# Patient Record
Sex: Female | Born: 1945 | Race: Black or African American | Hispanic: No | Smoking: Former smoker
Health system: Southern US, Community
[De-identification: ages and names within clinical notes are randomized; demographics above are authoritative.]

## PROBLEM LIST (undated history)

## (undated) DIAGNOSIS — R0600 Dyspnea, unspecified: Secondary | ICD-10-CM

## (undated) DIAGNOSIS — F419 Anxiety disorder, unspecified: Secondary | ICD-10-CM

## (undated) DIAGNOSIS — I10 Essential (primary) hypertension: Secondary | ICD-10-CM

## (undated) DIAGNOSIS — E119 Type 2 diabetes mellitus without complications: Secondary | ICD-10-CM

## (undated) DIAGNOSIS — K219 Gastro-esophageal reflux disease without esophagitis: Secondary | ICD-10-CM

## (undated) DIAGNOSIS — M1990 Unspecified osteoarthritis, unspecified site: Secondary | ICD-10-CM

## (undated) HISTORY — PX: CHOLECYSTECTOMY: SHX55

## (undated) HISTORY — PX: ABDOMINAL HYSTERECTOMY: SHX81

---

## 1998-03-05 ENCOUNTER — Ambulatory Visit (HOSPITAL_COMMUNITY): Admission: RE | Admit: 1998-03-05 | Discharge: 1998-03-05 | Payer: Self-pay | Admitting: Family Medicine

## 1998-11-11 ENCOUNTER — Encounter: Payer: Self-pay | Admitting: Family Medicine

## 1998-11-11 ENCOUNTER — Ambulatory Visit (HOSPITAL_COMMUNITY): Admission: RE | Admit: 1998-11-11 | Discharge: 1998-11-11 | Payer: Self-pay | Admitting: Family Medicine

## 1998-11-19 ENCOUNTER — Encounter: Payer: Self-pay | Admitting: General Surgery

## 1998-11-21 ENCOUNTER — Inpatient Hospital Stay (HOSPITAL_COMMUNITY): Admission: RE | Admit: 1998-11-21 | Discharge: 1998-11-24 | Payer: Self-pay | Admitting: General Surgery

## 1999-06-25 ENCOUNTER — Encounter: Payer: Self-pay | Admitting: Family Medicine

## 1999-06-25 ENCOUNTER — Ambulatory Visit (HOSPITAL_COMMUNITY): Admission: RE | Admit: 1999-06-25 | Discharge: 1999-06-25 | Payer: Self-pay | Admitting: Family Medicine

## 2000-05-30 ENCOUNTER — Ambulatory Visit (HOSPITAL_COMMUNITY): Admission: RE | Admit: 2000-05-30 | Discharge: 2000-05-30 | Payer: Self-pay | Admitting: Family Medicine

## 2000-06-09 ENCOUNTER — Encounter: Admission: RE | Admit: 2000-06-09 | Discharge: 2000-06-09 | Payer: Self-pay | Admitting: Oncology

## 2000-06-09 ENCOUNTER — Encounter: Payer: Self-pay | Admitting: Oncology

## 2000-06-29 ENCOUNTER — Encounter: Payer: Self-pay | Admitting: General Surgery

## 2000-06-29 ENCOUNTER — Encounter: Admission: RE | Admit: 2000-06-29 | Discharge: 2000-06-29 | Payer: Self-pay | Admitting: General Surgery

## 2001-02-16 ENCOUNTER — Ambulatory Visit (HOSPITAL_COMMUNITY): Admission: RE | Admit: 2001-02-16 | Discharge: 2001-02-16 | Payer: Self-pay | Admitting: Family Medicine

## 2001-02-16 ENCOUNTER — Encounter: Payer: Self-pay | Admitting: Family Medicine

## 2001-04-17 ENCOUNTER — Encounter: Payer: Self-pay | Admitting: Family Medicine

## 2001-04-17 ENCOUNTER — Ambulatory Visit (HOSPITAL_COMMUNITY): Admission: RE | Admit: 2001-04-17 | Discharge: 2001-04-17 | Payer: Self-pay | Admitting: Family Medicine

## 2001-09-11 ENCOUNTER — Ambulatory Visit (HOSPITAL_COMMUNITY): Admission: RE | Admit: 2001-09-11 | Discharge: 2001-09-11 | Payer: Self-pay | Admitting: *Deleted

## 2001-09-11 ENCOUNTER — Encounter: Payer: Self-pay | Admitting: *Deleted

## 2002-03-24 ENCOUNTER — Emergency Department (HOSPITAL_COMMUNITY): Admission: EM | Admit: 2002-03-24 | Discharge: 2002-03-24 | Payer: Self-pay | Admitting: Emergency Medicine

## 2002-03-31 ENCOUNTER — Inpatient Hospital Stay (HOSPITAL_COMMUNITY): Admission: EM | Admit: 2002-03-31 | Discharge: 2002-04-07 | Payer: Self-pay | Admitting: Emergency Medicine

## 2002-05-30 ENCOUNTER — Encounter (HOSPITAL_BASED_OUTPATIENT_CLINIC_OR_DEPARTMENT_OTHER): Admission: RE | Admit: 2002-05-30 | Discharge: 2002-08-28 | Payer: Self-pay | Admitting: Dermatology

## 2002-06-01 ENCOUNTER — Inpatient Hospital Stay (HOSPITAL_COMMUNITY): Admission: EM | Admit: 2002-06-01 | Discharge: 2002-06-05 | Payer: Self-pay | Admitting: Emergency Medicine

## 2002-06-01 ENCOUNTER — Encounter: Payer: Self-pay | Admitting: Emergency Medicine

## 2002-06-04 ENCOUNTER — Encounter (INDEPENDENT_AMBULATORY_CARE_PROVIDER_SITE_OTHER): Payer: Self-pay | Admitting: Cardiology

## 2002-08-16 ENCOUNTER — Emergency Department (HOSPITAL_COMMUNITY): Admission: EM | Admit: 2002-08-16 | Discharge: 2002-08-16 | Payer: Self-pay | Admitting: Emergency Medicine

## 2003-08-17 ENCOUNTER — Emergency Department (HOSPITAL_COMMUNITY): Admission: EM | Admit: 2003-08-17 | Discharge: 2003-08-17 | Payer: Self-pay

## 2003-08-19 ENCOUNTER — Ambulatory Visit (HOSPITAL_COMMUNITY): Admission: RE | Admit: 2003-08-19 | Discharge: 2003-08-19 | Payer: Self-pay | Admitting: Family Medicine

## 2003-10-07 ENCOUNTER — Encounter: Admission: RE | Admit: 2003-10-07 | Discharge: 2003-10-07 | Payer: Self-pay | Admitting: Family Medicine

## 2004-03-25 ENCOUNTER — Ambulatory Visit: Payer: Self-pay | Admitting: Family Medicine

## 2004-04-20 ENCOUNTER — Ambulatory Visit: Payer: Self-pay | Admitting: *Deleted

## 2004-06-07 ENCOUNTER — Ambulatory Visit: Payer: Self-pay | Admitting: Internal Medicine

## 2004-07-16 ENCOUNTER — Ambulatory Visit: Payer: Self-pay | Admitting: Family Medicine

## 2004-08-02 ENCOUNTER — Ambulatory Visit: Payer: Self-pay | Admitting: Family Medicine

## 2004-08-10 ENCOUNTER — Ambulatory Visit (HOSPITAL_COMMUNITY): Admission: RE | Admit: 2004-08-10 | Discharge: 2004-08-10 | Payer: Self-pay | Admitting: Family Medicine

## 2004-08-19 ENCOUNTER — Encounter: Admission: RE | Admit: 2004-08-19 | Discharge: 2004-08-19 | Payer: Self-pay | Admitting: Family Medicine

## 2004-08-30 ENCOUNTER — Ambulatory Visit: Payer: Self-pay | Admitting: Family Medicine

## 2004-10-28 ENCOUNTER — Ambulatory Visit: Payer: Self-pay | Admitting: Family Medicine

## 2005-02-07 ENCOUNTER — Ambulatory Visit: Payer: Self-pay | Admitting: Family Medicine

## 2005-02-13 ENCOUNTER — Emergency Department (HOSPITAL_COMMUNITY): Admission: EM | Admit: 2005-02-13 | Discharge: 2005-02-13 | Payer: Self-pay | Admitting: Emergency Medicine

## 2005-02-17 ENCOUNTER — Ambulatory Visit: Payer: Self-pay | Admitting: Family Medicine

## 2005-03-29 ENCOUNTER — Ambulatory Visit: Payer: Self-pay | Admitting: Family Medicine

## 2005-03-31 ENCOUNTER — Ambulatory Visit: Payer: Self-pay | Admitting: Family Medicine

## 2005-05-04 ENCOUNTER — Ambulatory Visit: Payer: Self-pay | Admitting: Family Medicine

## 2005-08-08 ENCOUNTER — Ambulatory Visit: Payer: Self-pay | Admitting: Family Medicine

## 2005-08-09 ENCOUNTER — Ambulatory Visit: Payer: Self-pay | Admitting: Family Medicine

## 2005-08-16 ENCOUNTER — Ambulatory Visit (HOSPITAL_COMMUNITY): Admission: RE | Admit: 2005-08-16 | Discharge: 2005-08-16 | Payer: Self-pay | Admitting: Family Medicine

## 2005-08-25 ENCOUNTER — Encounter: Admission: RE | Admit: 2005-08-25 | Discharge: 2005-08-25 | Payer: Self-pay | Admitting: Family Medicine

## 2005-09-27 ENCOUNTER — Ambulatory Visit: Payer: Self-pay | Admitting: Family Medicine

## 2005-12-29 ENCOUNTER — Ambulatory Visit: Payer: Self-pay | Admitting: Family Medicine

## 2006-03-21 ENCOUNTER — Ambulatory Visit: Payer: Self-pay | Admitting: Family Medicine

## 2006-05-29 ENCOUNTER — Ambulatory Visit: Payer: Self-pay | Admitting: Family Medicine

## 2006-08-28 ENCOUNTER — Ambulatory Visit (HOSPITAL_COMMUNITY): Admission: RE | Admit: 2006-08-28 | Discharge: 2006-08-28 | Payer: Self-pay | Admitting: Family Medicine

## 2006-10-17 ENCOUNTER — Ambulatory Visit: Payer: Self-pay | Admitting: Family Medicine

## 2007-01-09 ENCOUNTER — Ambulatory Visit: Payer: Self-pay | Admitting: Family Medicine

## 2007-01-26 ENCOUNTER — Ambulatory Visit: Payer: Self-pay | Admitting: Internal Medicine

## 2007-01-31 ENCOUNTER — Encounter (INDEPENDENT_AMBULATORY_CARE_PROVIDER_SITE_OTHER): Payer: Self-pay | Admitting: *Deleted

## 2007-04-05 ENCOUNTER — Ambulatory Visit: Payer: Self-pay | Admitting: Family Medicine

## 2007-05-08 ENCOUNTER — Ambulatory Visit: Payer: Self-pay | Admitting: Family Medicine

## 2007-05-08 LAB — CONVERTED CEMR LAB
ALT: 13 units/L (ref 0–35)
AST: 12 units/L (ref 0–37)
Albumin: 4.1 g/dL (ref 3.5–5.2)
Alkaline Phosphatase: 83 units/L (ref 39–117)
Basophils Absolute: 0 10*3/uL (ref 0.0–0.1)
Basophils Relative: 0 % (ref 0–1)
Calcium: 9.4 mg/dL (ref 8.4–10.5)
Eosinophils Absolute: 0.3 10*3/uL (ref 0.2–0.7)
Eosinophils Relative: 2 % (ref 0–5)
Glucose, Bld: 76 mg/dL (ref 70–99)
Hemoglobin: 13 g/dL (ref 12.0–15.0)
MCHC: 31 g/dL (ref 30.0–36.0)
MCV: 80.2 fL (ref 78.0–100.0)
RBC: 5.24 M/uL — ABNORMAL HIGH (ref 3.87–5.11)
RDW: 16.9 % — ABNORMAL HIGH (ref 11.5–15.5)
Sodium: 143 meq/L (ref 135–145)
TSH: 1.422 microintl units/mL (ref 0.350–5.50)
Total Bilirubin: 0.3 mg/dL (ref 0.3–1.2)
WBC: 14 10*3/uL — ABNORMAL HIGH (ref 4.0–10.5)

## 2007-08-03 ENCOUNTER — Ambulatory Visit: Payer: Self-pay | Admitting: Family Medicine

## 2007-08-31 ENCOUNTER — Ambulatory Visit (HOSPITAL_COMMUNITY): Admission: RE | Admit: 2007-08-31 | Discharge: 2007-08-31 | Payer: Self-pay | Admitting: Family Medicine

## 2007-12-07 ENCOUNTER — Ambulatory Visit: Payer: Self-pay | Admitting: Internal Medicine

## 2008-01-31 ENCOUNTER — Ambulatory Visit: Payer: Self-pay | Admitting: Family Medicine

## 2008-01-31 LAB — CONVERTED CEMR LAB
HDL: 42 mg/dL (ref >39)
Helicobacter Pylori Antibody-IgG: 0.6

## 2008-02-06 ENCOUNTER — Ambulatory Visit (HOSPITAL_COMMUNITY): Admission: RE | Admit: 2008-02-06 | Discharge: 2008-02-06 | Payer: Self-pay | Admitting: Family Medicine

## 2008-04-29 ENCOUNTER — Ambulatory Visit: Payer: Self-pay | Admitting: Family Medicine

## 2008-09-02 ENCOUNTER — Ambulatory Visit (HOSPITAL_COMMUNITY): Admission: RE | Admit: 2008-09-02 | Discharge: 2008-09-02 | Payer: Self-pay | Admitting: Family Medicine

## 2008-11-07 ENCOUNTER — Ambulatory Visit: Payer: Self-pay | Admitting: Family Medicine

## 2009-02-12 ENCOUNTER — Ambulatory Visit: Payer: Self-pay | Admitting: Family Medicine

## 2009-02-12 LAB — CONVERTED CEMR LAB
Albumin: 3.8 g/dL (ref 3.5–5.2)
Alkaline Phosphatase: 83 units/L (ref 39–117)
Basophils Relative: 0 % (ref 0–1)
Calcium: 9.1 mg/dL (ref 8.4–10.5)
Eosinophils Absolute: 0.2 10*3/uL (ref 0.0–0.7)
Eosinophils Relative: 2 % (ref 0–5)
Free T4: 0.98 ng/dL (ref 0.80–1.80)
Hemoglobin: 12.7 g/dL (ref 12.0–15.0)
MCHC: 30.5 g/dL (ref 30.0–36.0)
MCV: 80 fL (ref 78.0–100.0)
Microalb, Ur: 6.54 mg/dL — ABNORMAL HIGH (ref 0.00–1.89)
Monocytes Absolute: 0.7 10*3/uL (ref 0.1–1.0)
Monocytes Relative: 5 % (ref 3–12)
Neutrophils Relative %: 71 % (ref 43–77)
Potassium: 4 meq/L (ref 3.5–5.3)
Sodium: 141 meq/L (ref 135–145)
Total Bilirubin: 0.4 mg/dL (ref 0.3–1.2)
Total Protein: 7.2 g/dL (ref 6.0–8.3)
Vit D, 25-Hydroxy: 47 ng/mL (ref 30–89)

## 2009-02-18 ENCOUNTER — Ambulatory Visit: Payer: Self-pay | Admitting: Internal Medicine

## 2009-02-25 ENCOUNTER — Ambulatory Visit: Payer: Self-pay | Admitting: Internal Medicine

## 2009-03-30 ENCOUNTER — Ambulatory Visit: Payer: Self-pay | Admitting: Family Medicine

## 2009-05-12 ENCOUNTER — Ambulatory Visit: Payer: Self-pay | Admitting: Family Medicine

## 2009-05-12 LAB — CONVERTED CEMR LAB
BUN: 12 mg/dL (ref 6–23)
CO2: 26 meq/L (ref 19–32)
Chloride: 103 meq/L (ref 96–112)
Glucose, Bld: 106 mg/dL — ABNORMAL HIGH (ref 70–99)
Hemoglobin: 12.7 g/dL (ref 12.0–15.0)
Lymphocytes Relative: 25 % (ref 12–46)
Lymphs Abs: 3.2 10*3/uL (ref 0.7–4.0)
Monocytes Relative: 5 % (ref 3–12)
Neutrophils Relative %: 67 % (ref 43–77)
Platelets: 494 10*3/uL — ABNORMAL HIGH (ref 150–400)
Potassium: 3.6 meq/L (ref 3.5–5.3)
RBC: 5.03 M/uL (ref 3.87–5.11)
RDW: 16.2 % — ABNORMAL HIGH (ref 11.5–15.5)
TSH: 1.025 microintl units/mL (ref 0.350–4.500)

## 2009-05-21 ENCOUNTER — Ambulatory Visit (HOSPITAL_COMMUNITY): Admission: RE | Admit: 2009-05-21 | Discharge: 2009-05-21 | Payer: Self-pay | Admitting: Family Medicine

## 2009-05-21 ENCOUNTER — Encounter (INDEPENDENT_AMBULATORY_CARE_PROVIDER_SITE_OTHER): Payer: Self-pay | Admitting: Family Medicine

## 2009-05-24 ENCOUNTER — Emergency Department (HOSPITAL_COMMUNITY): Admission: EM | Admit: 2009-05-24 | Discharge: 2009-05-24 | Payer: Self-pay | Admitting: Family Medicine

## 2009-06-02 ENCOUNTER — Telehealth (INDEPENDENT_AMBULATORY_CARE_PROVIDER_SITE_OTHER): Payer: Self-pay | Admitting: *Deleted

## 2009-06-03 ENCOUNTER — Ambulatory Visit: Payer: Self-pay | Admitting: Family Medicine

## 2009-06-26 ENCOUNTER — Ambulatory Visit: Payer: Self-pay | Admitting: Internal Medicine

## 2009-06-26 ENCOUNTER — Encounter (INDEPENDENT_AMBULATORY_CARE_PROVIDER_SITE_OTHER): Payer: Self-pay | Admitting: Family Medicine

## 2009-06-26 LAB — CONVERTED CEMR LAB
Rhuematoid fact SerPl-aCnc: <20 intl units/mL (ref 0–20)
Sed Rate: 30 mm/hr — ABNORMAL HIGH (ref 0–22)

## 2009-07-07 ENCOUNTER — Ambulatory Visit: Payer: Self-pay | Admitting: Family Medicine

## 2009-09-07 ENCOUNTER — Ambulatory Visit (HOSPITAL_COMMUNITY): Admission: RE | Admit: 2009-09-07 | Discharge: 2009-09-07 | Payer: Self-pay | Admitting: Family Medicine

## 2009-11-03 ENCOUNTER — Ambulatory Visit: Payer: Self-pay | Admitting: Family Medicine

## 2009-12-14 DEATH — deceased

## 2010-01-13 ENCOUNTER — Ambulatory Visit: Payer: Self-pay | Admitting: Family Medicine

## 2010-02-04 ENCOUNTER — Ambulatory Visit: Payer: Self-pay | Admitting: Family Medicine

## 2010-02-04 ENCOUNTER — Other Ambulatory Visit: Admission: RE | Admit: 2010-02-04 | Discharge: 2010-02-04 | Payer: Self-pay | Admitting: Family Medicine

## 2010-06-15 NOTE — Progress Notes (Signed)
Summary: triage/gout  Phone Note Call from Patient   Caller: Patient Reason for Call: Talk to Nurse Summary of Call: Patient wanted something called in for gout.Marland KitchenMarland KitchenShe had an episode in 2009 and was given rx of Indomethacin by Dr. Reche Dixon at that time and patient states it worked really well.Marland KitchenMarland KitchenPatient states Left great toe is really painful and swollen.  She denies ETOH use...states she ate salmon 1 week ago..and that is the only thing she can think of.Marland KitchenMarland KitchenAdivise patient to drink lots of water and elevated extremity..She can continue to take alleve. Appointment made in Dr. Hinton Dyer acute schedue at 8:30 tomorrow.. Initial call taken by: Conchita Paris,  June 02, 2009 4:00 PM

## 2010-08-02 ENCOUNTER — Other Ambulatory Visit (HOSPITAL_COMMUNITY): Payer: Self-pay | Admitting: Family Medicine

## 2010-08-02 DIAGNOSIS — Z1231 Encounter for screening mammogram for malignant neoplasm of breast: Secondary | ICD-10-CM

## 2010-09-09 ENCOUNTER — Ambulatory Visit (HOSPITAL_COMMUNITY)
Admission: RE | Admit: 2010-09-09 | Discharge: 2010-09-09 | Disposition: A | Discharge status: Home / Self Care | Payer: Medicare Other | Source: Ambulatory Visit | Attending: Family Medicine | Admitting: Family Medicine

## 2010-09-09 DIAGNOSIS — Z1231 Encounter for screening mammogram for malignant neoplasm of breast: Secondary | ICD-10-CM | POA: Insufficient documentation

## 2010-10-01 NOTE — Discharge Summary (Signed)
NAME:  Kimberly Sellers, Kimberly Sellers                       ACCOUNT NO.:  [id number]   MEDICAL RECORD NO.:  [id number]                   PATIENT TYPE:  INP   LOCATION:  2005                                 FACILITY:  [facility]   PHYSICIAN:  Catalina Pizza, M.D.                     DATE OF BIRTH:  05-Sep-1945   DATE OF ADMISSION:  06/01/2002  DATE OF DISCHARGE:  06/05/2002                                 DISCHARGE SUMMARY   DISCHARGE DIAGNOSES:  1. 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  2. Bullous pemphigoid, question/bullous vasculitis.  3. Hypertension.  4. Anxiety disorder.   DISCHARGE MEDICATIONS:  1. Mavik 4 mg p.o. daily.  2. Hydrochlorothiazide 25 mg p.o. daily.  3. Cardizem CD 90 mg p.o. daily.  4. K-Dur 20 mEq p.o. every other day.  5. Protonix 40 mg p.o. daily.  6. Prednisone 5 mg p.o. daily.  7. Alprazolam 0.25 mg one in the a.m., one at lunch, and two at bedtime.  8. Percocet 5/325 p.o. q.4-6h. p.r.n. for pain.  9. Enteric coated aspirin 81 mg p.o. daily.  10.      Simethicone 80 mg p.o. q.6h. p.r.n. for gas.   CHIEF COMPLAINT:  Shortness of breath.   HISTORY OF PRESENT ILLNESS:  A 65 year old obese African American female  with past medical history of hypertension treated for bullous vasculitis x3  months presents to the emergency department with complaints of shortness of  breath and heart fluttering while trying to pull up out of bed the morning  of admission.  The patient took all medications, blood pressure, prednisone,  anxiety pill, suppository, and pain medications.  She got back in bed and  when exerting herself, got acutely short of breath and felt fluttering  sensation in her chest.  No other pain, no radiation, no diaphoresis, mild  nausea relieved by Ginger Ale.  EMS initially found patient SVT rate of 188,  but 100% on room air saturations. She was given Adenosine 6 mg which showed  her rate to the 120s.  Fluttering sensation stopped and the  patient felt  much better.  She had negative Cardiolite in April 2003.  She was last  hospitalized for the bullous lesions in the legs November 2003.  She is  currently followed by Dr. Rocco Serene, Dr. Audria Nine at [facility], and  routine health management at the foot center.   ALLERGIES:  No known drug allergies.   PAST MEDICAL HISTORY:  1. Hypertension x 27 years.  2. Gallbladder removed approximately four years ago.  3. Anxiety.  4. Bullous disease started in October of 2003.   LABORATORY DATA:  Pertinent labs during hospitalization and upon discharge  show wbc 10.6, hemoglobin 11.5, hematocrit 35.3, MCV 76.2, platelets 341.  PT 13.4, INR 1.0.  D-dimer of 0.90.  Sodium of 137, potassium 3.0, chloride  103, CO2 27, glucose 98, BUN 7, creatinine 0.6, calcium  8.5.  On admission,  total protein 6.8, albumin 2.9, AST 15, ALT 13, alkaline phosphatase 77,  total bilirubin 0.6.  Hemoglobin A1C was 6.6.  CK 88, 90 and 64,  respectively; CK-MB 1.3, 1.1, and 0.8, respectively; troponin I of 0.06,  0.06, and 0.04, respectively.  Total cholesterol 147, triglycerides 127, HDL  32, LDL 90. TSH 0.605.   A 2-D echo showed LVF of 55 to 65%, mild to moderate LV wall thickness, mild  MVR, LA was mildly dilated.  Chest x-ray  showed mild cardiomegaly, no focal  air space passage or effusions.  Stabilization of right hemidiaphragm.  No  acute disease.   HOSPITAL COURSE:  PROBLEM #1 -  SHORTNESS OF BREATH:  The patient had mild  shortness of breath throughout hospitalization with no signs of  abnormalities causing this shortness of breath.  The patient did require  some two to three liters of O2 by nasal cannula initially but O2 saturations  remained stable in the upper 90s range.  The patient did have some dyspnea  on exertion with ambulation which was difficult secondary to pain in lower  extremities.  The patient did not require any home O2.   PROBLEM #2 -  HEART FLUTTERING/SUPRAVENTRICULAR  TACHYCARDIA:  Given these  symptoms and her initial presentation to EMS, did get a cardiologist, Dr.  Swaziland, to evaluate.  In consultation, he felt that this was an SVT most  likely AV node reentry tachycardia possibly secondary to hypokalemia.  The  patient did not exhibit any more of this rate during hospital course and was  continued on beta-blocker and calcium channel blocker for rate control.  Dr.  Swaziland suggested follow-up echocardiogram  for this, but suggested to this  on outpatient basis.   PROBLEM #3 -  BULLOUS VASCULITIS/BULLOUS PEMPHIGOID:  Unclear exact  diagnosis followed by Dr. Donzetta Starch for this.  The patient is currently on  low dose prednisone and routine bandage changes at the foot center.  We did  redress legs prior to leaving the hospital with communication directly to  the foot care center.  The patient needed to follow up both with Dr. Yetta Barre  and foot care center for further evaluation and treatment of these wounds.   PROBLEM #4 -  HYPERTENSION:  The patient's blood pressure was stable upon  discharge in the 140s to 130s systolically.  She was started on Cardizem for  this.  She was initially started on beta-blocker but after 24 hours, she  continued to have dizziness and it was felt it may be related to beta-  blocker so considered Cardizem as good replacement.  Unclear if dizziness  was related at all to beta-blocker.   PROBLEM #5 -  ANXIETY DISORDER:  The patient's anxiety level was slightly  elevated during hospitalization.  She was continued on Xanax, given the fact  that she may have some type of withdrawal symptoms if discontinued.  Anxiety  relatively in good control with Xanax during hospitalization.   DISPOSITION:  The patient is encouraged to use a walker for stability,  continue with low salt diet.  She is to return to foot clinic.  Call Dr. Audria Nine at [facility] or call Dr. Donzetta Starch if she has any problems with  her legs.  Return to the  emergency department if she is having any worsening  problems with bleeding or worsening pain.  She will need to recheck a BNP  since a mild hypokalemia but continued on her K-Dur 20  mEq daily.  She was  suppose to have an appointment with Dr. Donzetta Starch at 11:30 on June 13, 2002, and appointment at [facility] with Dr. Audria Nine on June 22, 2002,  and encouraged to keep these appointments for further evaluation.                                               Catalina Pizza, M.D.    ZH/MEDQ  D:  09/09/2002  T:  09/10/2002  Job:  [id number]

## 2010-10-01 NOTE — Consult Note (Signed)
NAME:  MELANA, HINGLE NO.:  [id number]   MEDICAL RECORD NO.:  [id number]                   PATIENT TYPE:  INP   LOCATION:  2005                                 FACILITY:  [facility]   PHYSICIAN:  Peter M. Swaziland, M.D.               DATE OF BIRTH:  03-10-1946   DATE OF CONSULTATION:  DATE OF DISCHARGE:                                   CONSULTATION   HISTORY OF PRESENT ILLNESS:  Ms. Kimberly Sellers is a 65 year old black female  admitted yesterday morning with SVT. She has taken a suppository in the  morning and was rushing to get out of bed when her heart started racing. It  did not stop. She was brought to the emergency room where monitor  demonstrated irregular narrow complexes, VT with a rate of 180. She  converted with IV adenosine. She has had no further arrhythmias since she  has been in the hospital. The patient reports recent symptoms of tachy  palpitations but usually only lasting a few minutes. She has had no recent  use of decongestants and she avoids caffeine. She is a nonsmoker. She has  had prior cardiac evaluation by Dr. Fraser Din in 4/03 with a negative  Cardiolite study at that time which showed ejection fraction of 58%.   PAST MEDICAL HISTORY:  Significant for hypertension, history of bullous  pemphigoid recently treated with steroids. She is status post  cholecystectomy and has a history of anxiety.   ALLERGIES:  No known drug allergies.   CURRENT MEDICATIONS:  1. Potassium 40 mEq daily.  2. Mavik 4 mg per day.  3. Hydrochlorothiazide 25 mg per day.  4. Prednisone 5 mg per day.  5. Xanax p.r.n.  6. Metoprolol 25 mg b.i.d.   SOCIAL HISTORY:  The patient is a nonsmoker, nondrinker. She lives with her  husband.   FAMILY HISTORY:  Mother died in her 70s with diabetes and had bilateral  BKAs. Father died in his 45s with hypertension. One brother status post  CABG. One sister died with ovarian cancer. Strong family history of   hypertension.   PHYSICAL EXAMINATION:  GENERAL:  The patient is a obese, black female in no  acute distress.  VITAL SIGNS:  Blood pressure is 130/72, pulse 72, normal sinus rhythm, she  is afebrile.  HEENT:  Unremarkable.  NECK:  She has no JVD or bruits.  LUNGS:  Clear.  CARDIAC EXAM:  Reveals regular rate and rhythm without murmurs, rubs,  gallops, or clicks.  ABDOMEN:  Obese, soft, nontender. Bowel sounds are positive.  EXTREMITIES:  Lower extremities reveal multiple bullous lesions. Her legs  from the knee down are heavily wrapped.   LABORATORY DATA:  White count is 11,000, hemoglobin 11.6, hematocrit 35.7,  platelets 326,000. Sodium 140, potassium 3.2, chloride 101, CO2 32, BUN 4,  creatinine 0.8, glucose 87, hemoglobin A1c 6.6%. CK-MBs are negative x4.  Troponin peak was 0.06 and  then 0.04. TSH was 0.605. Triglycerides 127,  cholesterol 147, LDL 90, HDL 32. ECG shows normal sinus rhythm with LVH.   IMPRESSION:  1. 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     Possibly exacerbated by hypokalemia.  2. Bullous pemphigoid.  3. Hypertension.   PLAN:  I agree with the addition of beta blocker. This seems to be  controlling her well, and she remains in sinus rhythm. Will also help  further treat her hypertension. Would recommend a followup echocardiogram,  but this can be performed as an outpatient. I think the patient is stable  for discharge to home. If she has recurrently SVT, we may need to push the  dose of beta blocker up higher.                                               Peter M. Swaziland, M.D.    PMJ/MEDQ  D:  06/02/2002  T:  06/03/2002  Job:  [id number]   cc:   Dineen Kid. Reche Dixon, M.D.  [address]  [place] [address]  Fax: [phone]   Maurice March, M.D.  [address] [place]  [place] [address]  Fax: [phone]   Candace Cruise, M.D.

## 2010-10-01 NOTE — Discharge Summary (Signed)
NAME:  Kimberly Sellers, Kimberly Sellers                       ACCOUNT NO.:  [id number]   MEDICAL RECORD NO.:  [id number]                   PATIENT TYPE:  INP   LOCATION:  5015                                 FACILITY:  [facility]   PHYSICIAN:  Stephanie Swaziland, NP                DATE OF BIRTH:  1945-10-15   DATE OF ADMISSION:  03/30/2002  DATE OF DISCHARGE:  04/07/2002                                 DISCHARGE SUMMARY   DATE OF BIRTH:  11-Oct-1945.   PRIMARY CARE PHYSICIAN:  Dow Adolph, M.D. at [facility] [address]   DISCHARGE DIAGNOSES:  1. Bullous vasculitis.  2. Hypertensin.  3. Gastroesophageal reflux disease.   DISCHARGE MEDICATIONS:  1. Atarax 25 mg every four hours as needed itching.  2. Pepcid 20 mg daily.  3. Triamcinolone 0.1% lotion to both legs with dressing changes twice a day.  4. Prednisone 60 mg daily.  5. Tylox 325 mg one to two tabs every four hours as needed pain.  6. Benadryl 25 mg every six hours as needed itching.  7. Altace 2.5 mg daily.   CONSULTATIONS:  Rocco Serene, M.D., Dermatology.   PROCEDURE:  Biopsy of bullous lesions on April 01, 2002 by Dr. Donzetta Starch.   LABORATORY DATA:  WBC count 15.9, red blood cells 4.43, hemoglobin 10.9,  hematocrit 34.1, MCV 77.1, RDW 16.2. Sodium 138, potassium 4.0, chloride 99,  CO2 29, glucose 130, BUN 25, creatinine 1.1, TSH 2.477. Hepatitis C antibody  was negative. Protein electrophoresis serum albumin 47.4, alpha I 3.6, alpha  II is 16.9, serum beta is 16.5, serum gamma is 15.6. ANA is negative. RPR is  negative. Neutrophil cytoplasmic antibody IGG less than 1 to 15. C-diff  toxin was negative.   HISTORY OF PRESENT ILLNESS:  The patient is a 65 year old female who  presented to [facility] on March 31, 2002 with a four week  history of increasing in frequency associated with an onset of tiny vesicles  in her legs. Over the previous two weeks, the vesicles began to enlarge. She  saw her primary care physician,  Dr. Audria Nine, approximately one week prior  to admission and underwent a biopsy of the bullous lesions. The patient was  started on antibiotic therapy. Despite this, the bullous lesions became  larger and some had broken open and were painful. The patient was admitted  for further evaluation and treatment.   HOSPITAL COURSE:  1. Bullous eruption. Initial differential diagnoses included bullous     pemphigoid versus pemphigus outbreak. The patient was started on IV     steroids and wound care. Her HCTZ and Norvasc were held, thinking that     these may aggravating medications. A Dermatology consult was obtained and     the patient was seen by Dr. Donzetta Starch on March 22, 2002 at which time     she underwent a tumor biopsy of the bullous vesicles.  Recommendations     were to continue IV steroids and change to by mouth Prednisone and     continue local wound care. During her hospitalization, several bullous     lesions did burst and she did have some underlying erythema but no signs     or symptoms of superimposed infection. Dressing changes were continued     twice a day with also topical steroids applied. The patient was seen     again on April 03, 2002 by Dr. Donzetta Starch. 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 An ANA was performed and     results were negative. Recommendations were to continue local wound care     and continue antibiotics with follow-up with Dr. Yetta Barre on an outpatient     basis, at which time he would titrate her steroids due to her clinical     response. She has a scheduled appointment on April 10, 2002 at 12:30     p.m.  2. Hypertension. Again, the patient was previously on HCTZ and Norvasc at     home. These were held, thinking that they were more aggravating factors     in her bullous eruptions. Blood pressure was elevated prior to discharge.     Altace 2.5 mg was started with good response. Discharge blood pressure      was 145/65. The patient will have a one week appointment with her primary     care physician and BMP.  3. Microcytic anemia. The patient was noted to have a microcytic anemia. She     had no signs or symptoms of active bleeding. She is on Pepcid for gastric     protection. Further evaluation and treatment will be left to her primary     care physician.   FOLLOW UP:  Again, she has an appointment with Dr. Donzetta Starch on April 10, 2002 at 12:30 p.m. She will have a one week appointment with Dr.  Audria Nine with a follow-up BMP and CBC.   SPECIAL INSTRUCTIONS:  Arrangements have been made for home health nursing  for dressing changes twice a day at home.                                                 Stephanie Swaziland, NP    SJ/MEDQ  D:  04/07/2002  T:  04/08/2002  Job:  [id number]   cc:   Deirdre Peer. Polite, M.D.  [address]  [place], [place] [address]  Fax: [phone]   Maurice March, M.D.  [address] [place]  [place] [address]  Fax: [phone]   Rocco Serene, M.D.  [address] [place]  [place] [address]  Fax: [phone]

## 2010-11-16 ENCOUNTER — Other Ambulatory Visit (HOSPITAL_COMMUNITY): Payer: Self-pay | Admitting: Neurosurgery

## 2010-11-16 ENCOUNTER — Ambulatory Visit (HOSPITAL_COMMUNITY)
Admission: RE | Admit: 2010-11-16 | Discharge: 2010-11-16 | Disposition: A | Discharge status: Home / Self Care | Payer: Medicare Other | Source: Ambulatory Visit | Attending: Neurosurgery | Admitting: Neurosurgery

## 2010-11-16 DIAGNOSIS — M545 Low back pain, unspecified: Secondary | ICD-10-CM | POA: Insufficient documentation

## 2010-11-16 DIAGNOSIS — M5410 Radiculopathy, site unspecified: Secondary | ICD-10-CM

## 2010-11-16 DIAGNOSIS — M51379 Other intervertebral disc degeneration, lumbosacral region without mention of lumbar back pain or lower extremity pain: Secondary | ICD-10-CM | POA: Insufficient documentation

## 2010-11-16 DIAGNOSIS — M5137 Other intervertebral disc degeneration, lumbosacral region: Secondary | ICD-10-CM | POA: Insufficient documentation

## 2011-08-16 ENCOUNTER — Other Ambulatory Visit (HOSPITAL_COMMUNITY): Payer: Self-pay | Admitting: Family Medicine

## 2011-08-16 DIAGNOSIS — Z1231 Encounter for screening mammogram for malignant neoplasm of breast: Secondary | ICD-10-CM

## 2011-09-15 ENCOUNTER — Ambulatory Visit (HOSPITAL_COMMUNITY)
Admission: RE | Admit: 2011-09-15 | Discharge: 2011-09-15 | Disposition: A | Discharge status: Home / Self Care | Payer: Medicare Other | Source: Ambulatory Visit | Attending: Family Medicine | Admitting: Family Medicine

## 2011-09-15 DIAGNOSIS — Z1231 Encounter for screening mammogram for malignant neoplasm of breast: Secondary | ICD-10-CM

## 2012-06-01 ENCOUNTER — Inpatient Hospital Stay (HOSPITAL_COMMUNITY)
Admission: EM | Admit: 2012-06-01 | Discharge: 2012-06-11 | DRG: 872 | Disposition: A | Discharge status: Home / Self Care | Payer: Medicare Other | Attending: Family Medicine | Admitting: Family Medicine

## 2012-06-01 ENCOUNTER — Emergency Department (HOSPITAL_COMMUNITY): Payer: Medicare Other

## 2012-06-01 ENCOUNTER — Encounter (HOSPITAL_COMMUNITY): Payer: Self-pay

## 2012-06-01 DIAGNOSIS — E278 Other specified disorders of adrenal gland: Secondary | ICD-10-CM

## 2012-06-01 DIAGNOSIS — I498 Other specified cardiac arrhythmias: Secondary | ICD-10-CM | POA: Diagnosis present

## 2012-06-01 DIAGNOSIS — E279 Disorder of adrenal gland, unspecified: Secondary | ICD-10-CM

## 2012-06-01 DIAGNOSIS — K567 Ileus, unspecified: Secondary | ICD-10-CM

## 2012-06-01 DIAGNOSIS — E872 Acidosis, unspecified: Secondary | ICD-10-CM | POA: Diagnosis present

## 2012-06-01 DIAGNOSIS — Z79899 Other long term (current) drug therapy: Secondary | ICD-10-CM

## 2012-06-01 DIAGNOSIS — R1115 Cyclical vomiting syndrome unrelated to migraine: Secondary | ICD-10-CM | POA: Diagnosis present

## 2012-06-01 DIAGNOSIS — K436 Other and unspecified ventral hernia with obstruction, without gangrene: Secondary | ICD-10-CM | POA: Diagnosis present

## 2012-06-01 DIAGNOSIS — R6889 Other general symptoms and signs: Secondary | ICD-10-CM

## 2012-06-01 DIAGNOSIS — Z7982 Long term (current) use of aspirin: Secondary | ICD-10-CM

## 2012-06-01 DIAGNOSIS — B349 Viral infection, unspecified: Secondary | ICD-10-CM

## 2012-06-01 DIAGNOSIS — K560 Paralytic ileus: Secondary | ICD-10-CM | POA: Diagnosis present

## 2012-06-01 DIAGNOSIS — A419 Sepsis, unspecified organism: Secondary | ICD-10-CM | POA: Diagnosis present

## 2012-06-01 DIAGNOSIS — I10 Essential (primary) hypertension: Secondary | ICD-10-CM

## 2012-06-01 DIAGNOSIS — E876 Hypokalemia: Secondary | ICD-10-CM

## 2012-06-01 DIAGNOSIS — I4891 Unspecified atrial fibrillation: Secondary | ICD-10-CM

## 2012-06-01 DIAGNOSIS — K56600 Partial intestinal obstruction, unspecified as to cause: Secondary | ICD-10-CM

## 2012-06-01 DIAGNOSIS — R52 Pain, unspecified: Secondary | ICD-10-CM

## 2012-06-01 DIAGNOSIS — J111 Influenza due to unidentified influenza virus with other respiratory manifestations: Secondary | ICD-10-CM

## 2012-06-01 DIAGNOSIS — I471 Supraventricular tachycardia, unspecified: Secondary | ICD-10-CM

## 2012-06-01 DIAGNOSIS — R109 Unspecified abdominal pain: Secondary | ICD-10-CM

## 2012-06-01 DIAGNOSIS — D72829 Elevated white blood cell count, unspecified: Secondary | ICD-10-CM

## 2012-06-01 DIAGNOSIS — R112 Nausea with vomiting, unspecified: Secondary | ICD-10-CM

## 2012-06-01 DIAGNOSIS — F411 Generalized anxiety disorder: Secondary | ICD-10-CM | POA: Diagnosis present

## 2012-06-01 DIAGNOSIS — E874 Mixed disorder of acid-base balance: Secondary | ICD-10-CM | POA: Diagnosis present

## 2012-06-01 HISTORY — DX: Nausea with vomiting, unspecified: R11.2

## 2012-06-01 HISTORY — DX: Anxiety disorder, unspecified: F41.9

## 2012-06-01 HISTORY — DX: Essential (primary) hypertension: I10

## 2012-06-01 LAB — CREATININE, SERUM
Creatinine, Ser: 0.61 mg/dL (ref 0.50–1.10)
GFR calc Af Amer: >90 mL/min (ref >90)
GFR calc non Af Amer: >90 mL/min (ref >90)

## 2012-06-01 LAB — CBC WITH DIFFERENTIAL/PLATELET
Basophils Relative: 0 % (ref 0–1)
Lymphocytes Relative: 7 % — ABNORMAL LOW (ref 12–46)
Lymphs Abs: 1.3 10*3/uL (ref 0.7–4.0)
MCV: 74.2 fL — ABNORMAL LOW (ref 78.0–100.0)
Monocytes Relative: 3 % (ref 3–12)
Neutrophils Relative %: 90 % — ABNORMAL HIGH (ref 43–77)
Platelets: 444 10*3/uL — ABNORMAL HIGH (ref 150–400)
RDW: 16.9 % — ABNORMAL HIGH (ref 11.5–15.5)
WBC: 19.9 10*3/uL — ABNORMAL HIGH (ref 4.0–10.5)

## 2012-06-01 LAB — CBC
Platelets: 532 10*3/uL — ABNORMAL HIGH (ref 150–400)
RBC: 6.28 MIL/uL — ABNORMAL HIGH (ref 3.87–5.11)
WBC: 33.8 10*3/uL — ABNORMAL HIGH (ref 4.0–10.5)

## 2012-06-01 LAB — COMPREHENSIVE METABOLIC PANEL
Albumin: 3.5 g/dL (ref 3.5–5.2)
BUN: 12 mg/dL (ref 6–23)
Chloride: 102 mEq/L (ref 96–112)
Creatinine, Ser: 0.53 mg/dL (ref 0.50–1.10)
GFR calc Af Amer: >90 mL/min (ref >90)
GFR calc non Af Amer: >90 mL/min (ref >90)
Glucose, Bld: 144 mg/dL — ABNORMAL HIGH (ref 70–99)
Total Bilirubin: 0.3 mg/dL (ref 0.3–1.2)

## 2012-06-01 LAB — BASIC METABOLIC PANEL
CO2: 18 mEq/L — ABNORMAL LOW (ref 19–32)
Calcium: 10 mg/dL (ref 8.4–10.5)
Creatinine, Ser: 0.79 mg/dL (ref 0.50–1.10)
Glucose, Bld: 203 mg/dL — ABNORMAL HIGH (ref 70–99)

## 2012-06-01 LAB — URINALYSIS, ROUTINE W REFLEX MICROSCOPIC
Bilirubin Urine: NEGATIVE
Glucose, UA: NEGATIVE mg/dL
Ketones, ur: 15 mg/dL — AB
Leukocytes, UA: NEGATIVE
Protein, ur: NEGATIVE mg/dL

## 2012-06-01 LAB — OCCULT BLOOD, POC DEVICE: Fecal Occult Bld: NEGATIVE

## 2012-06-01 MED ORDER — SODIUM CHLORIDE 0.9 % IV SOLN: INTRAVENOUS | Status: DC

## 2012-06-01 MED ORDER — ONDANSETRON HCL 4 MG/2ML IJ SOLN
4.0000 mg | Freq: Once | INTRAMUSCULAR | Status: AC
  Administered 2012-06-01: 4 mg via INTRAVENOUS

## 2012-06-01 MED ORDER — HYDROMORPHONE HCL PF 1 MG/ML IJ SOLN
1.0000 mg | Freq: Once | INTRAMUSCULAR | Status: AC
  Administered 2012-06-01: 1 mg via INTRAVENOUS
  Filled 2012-06-01: qty 1

## 2012-06-01 MED ORDER — LISINOPRIL 20 MG PO TABS: 20.0000 mg | ORAL_TABLET | Freq: Every day | ORAL | Status: DC

## 2012-06-01 MED ORDER — ENOXAPARIN SODIUM 40 MG/0.4ML ~~LOC~~ SOLN
40.0000 mg | SUBCUTANEOUS | Status: DC
  Administered 2012-06-01: 40 mg via SUBCUTANEOUS
  Filled 2012-06-01: qty 0.4

## 2012-06-01 MED ORDER — METOCLOPRAMIDE HCL 5 MG/ML IJ SOLN
10.0000 mg | Freq: Once | INTRAMUSCULAR | Status: AC
  Administered 2012-06-01: 10 mg via INTRAVENOUS
  Filled 2012-06-01: qty 2

## 2012-06-01 MED ORDER — ONDANSETRON HCL 4 MG/2ML IJ SOLN
4.0000 mg | Freq: Four times a day (QID) | INTRAMUSCULAR | Status: DC | PRN
  Administered 2012-06-01 – 2012-06-06 (×4): 4 mg via INTRAVENOUS
  Filled 2012-06-01 (×5): qty 2

## 2012-06-01 MED ORDER — ACETAMINOPHEN 325 MG PO TABS
650.0000 mg | ORAL_TABLET | Freq: Four times a day (QID) | ORAL | Status: DC | PRN
  Administered 2012-06-03: 650 mg via ORAL
  Filled 2012-06-01: qty 2

## 2012-06-01 MED ORDER — SODIUM CHLORIDE 0.9 % IV BOLUS (SEPSIS)
1000.0000 mL | Freq: Once | INTRAVENOUS | Status: AC
  Administered 2012-06-01: 1000 mL via INTRAVENOUS

## 2012-06-01 MED ORDER — ONDANSETRON HCL 4 MG/2ML IJ SOLN
4.0000 mg | Freq: Once | INTRAMUSCULAR | Status: AC
  Administered 2012-06-01: 4 mg via INTRAVENOUS
  Filled 2012-06-01: qty 2

## 2012-06-01 MED ORDER — PANTOPRAZOLE SODIUM 40 MG IV SOLR
40.0000 mg | INTRAVENOUS | Status: DC
  Administered 2012-06-01 – 2012-06-09 (×9): 40 mg via INTRAVENOUS
  Filled 2012-06-01 (×12): qty 40

## 2012-06-01 MED ORDER — PANTOPRAZOLE SODIUM 40 MG PO TBEC: 40.0000 mg | DELAYED_RELEASE_TABLET | Freq: Every day | ORAL | Status: DC

## 2012-06-01 MED ORDER — PIPERACILLIN-TAZOBACTAM 3.375 G IVPB
3.3750 g | Freq: Three times a day (TID) | INTRAVENOUS | Status: DC
  Administered 2012-06-01 – 2012-06-08 (×20): 3.375 g via INTRAVENOUS
  Filled 2012-06-01 (×23): qty 50

## 2012-06-01 MED ORDER — ASPIRIN 81 MG PO TABS: 81.0000 mg | ORAL_TABLET | Freq: Every day | ORAL | Status: DC

## 2012-06-01 MED ORDER — ONDANSETRON HCL 4 MG/2ML IJ SOLN: 4.0000 mg | Freq: Three times a day (TID) | INTRAMUSCULAR | Status: DC | PRN

## 2012-06-01 MED ORDER — OSELTAMIVIR PHOSPHATE 75 MG PO CAPS
75.0000 mg | ORAL_CAPSULE | Freq: Two times a day (BID) | ORAL | Status: DC
  Filled 2012-06-01: qty 1

## 2012-06-01 MED ORDER — OXYCODONE HCL 5 MG PO TABS
5.0000 mg | ORAL_TABLET | ORAL | Status: DC | PRN
  Administered 2012-06-03 – 2012-06-04 (×2): 5 mg via ORAL
  Filled 2012-06-01 (×2): qty 1

## 2012-06-01 MED ORDER — METOPROLOL TARTRATE 1 MG/ML IV SOLN
5.0000 mg | INTRAVENOUS | Status: AC
  Administered 2012-06-01 – 2012-06-02 (×2): 5 mg via INTRAVENOUS
  Filled 2012-06-01: qty 5

## 2012-06-01 MED ORDER — MORPHINE SULFATE 2 MG/ML IJ SOLN
1.0000 mg | INTRAMUSCULAR | Status: DC | PRN
  Administered 2012-06-01: 1 mg via INTRAVENOUS
  Filled 2012-06-01: qty 1

## 2012-06-01 MED ORDER — SODIUM CHLORIDE 0.9 % IV SOLN
INTRAVENOUS | Status: DC
  Administered 2012-06-01 – 2012-06-02 (×2): via INTRAVENOUS
  Administered 2012-06-02: 150 mL via INTRAVENOUS
  Administered 2012-06-06: via INTRAVENOUS

## 2012-06-01 MED ORDER — IOHEXOL 300 MG/ML  SOLN
100.0000 mL | Freq: Once | INTRAMUSCULAR | Status: AC | PRN
  Administered 2012-06-01: 100 mL via INTRAVENOUS

## 2012-06-01 MED ORDER — MORPHINE SULFATE 4 MG/ML IJ SOLN
4.0000 mg | Freq: Once | INTRAMUSCULAR | Status: AC
  Administered 2012-06-01: 4 mg via INTRAVENOUS
  Filled 2012-06-01: qty 1

## 2012-06-01 MED ORDER — LISINOPRIL-HYDROCHLOROTHIAZIDE 20-25 MG PO TABS: 1.0000 | ORAL_TABLET | Freq: Every day | ORAL | Status: DC

## 2012-06-01 MED ORDER — ONDANSETRON HCL 4 MG/2ML IJ SOLN
INTRAMUSCULAR | Status: AC
  Filled 2012-06-01: qty 2

## 2012-06-01 MED ORDER — ASPIRIN EC 81 MG PO TBEC
81.0000 mg | DELAYED_RELEASE_TABLET | Freq: Every day | ORAL | Status: DC
  Administered 2012-06-03 – 2012-06-11 (×9): 81 mg via ORAL
  Filled 2012-06-01 (×11): qty 1

## 2012-06-01 MED ORDER — DILTIAZEM HCL ER 240 MG PO CP24: 240.0000 mg | ORAL_CAPSULE | Freq: Every day | ORAL | Status: DC

## 2012-06-01 MED ORDER — PNEUMOCOCCAL VAC POLYVALENT 25 MCG/0.5ML IJ INJ
0.5000 mL | INJECTION | INTRAMUSCULAR | Status: AC
  Filled 2012-06-01: qty 0.5

## 2012-06-01 MED ORDER — PROMETHAZINE HCL 25 MG/ML IJ SOLN
25.0000 mg | Freq: Four times a day (QID) | INTRAMUSCULAR | Status: DC | PRN
  Administered 2012-06-01: 25 mg via INTRAMUSCULAR
  Filled 2012-06-01 (×2): qty 1

## 2012-06-01 MED ORDER — HYDROCHLOROTHIAZIDE 25 MG PO TABS: 25.0000 mg | ORAL_TABLET | Freq: Every day | ORAL | Status: DC

## 2012-06-01 MED ORDER — LORAZEPAM 2 MG/ML IJ SOLN
0.5000 mg | Freq: Four times a day (QID) | INTRAMUSCULAR | Status: DC | PRN
  Administered 2012-06-01: 0.5 mg via INTRAVENOUS
  Filled 2012-06-01: qty 1

## 2012-06-01 MED ORDER — IOHEXOL 300 MG/ML  SOLN
50.0000 mL | INTRAMUSCULAR | Status: AC
  Administered 2012-06-01 (×2): 25 mL via ORAL

## 2012-06-01 MED ORDER — ONDANSETRON HCL 4 MG PO TABS
4.0000 mg | ORAL_TABLET | Freq: Four times a day (QID) | ORAL | Status: DC | PRN
  Administered 2012-06-10 (×2): 4 mg via ORAL
  Filled 2012-06-01 (×2): qty 1

## 2012-06-01 MED ORDER — HYDROMORPHONE HCL PF 1 MG/ML IJ SOLN: 1.0000 mg | INTRAMUSCULAR | Status: DC | PRN

## 2012-06-01 MED ORDER — HYDROMORPHONE HCL PF 1 MG/ML IJ SOLN
0.5000 mg | Freq: Once | INTRAMUSCULAR | Status: AC
  Administered 2012-06-01: 0.5 mg via INTRAVENOUS
  Filled 2012-06-01: qty 1

## 2012-06-01 MED ORDER — ACETAMINOPHEN 650 MG RE SUPP: 650.0000 mg | Freq: Four times a day (QID) | RECTAL | Status: DC | PRN

## 2012-06-01 NOTE — Progress Notes (Signed)
ANTIBIOTIC CONSULT NOTE - INITIAL  Pharmacy Consult for zosyn Indication: r/o abdominal infection  No Known Allergies  Patient Measurements: Height: 5\' 5"  (165.1 cm) Weight: 239 lb 13.8 oz (108.8 kg) IBW/kg (Calculated) : 57    Vital Signs: Temp: 97.4 F (36.3 C) (01/17 2040) Temp src: Oral (01/17 2040) BP: 162/102 mmHg (01/17 2040) Pulse Rate: 157  (01/17 2040) Intake/Output from previous day:   Intake/Output from this shift:    Labs:  Basename 06/01/12 2155 06/01/12 1723 06/01/12 1255 06/01/12 1119  WBC -- 33.8* -- 19.9*  HGB -- 15.7* -- 14.0  PLT -- 532* -- 444*  LABCREA -- -- -- --  CREATININE 0.79 0.61 0.53 --   Estimated Creatinine Clearance: 82.6 ml/min (by C-G formula based on Cr of 0.79). No results found for this basename: VANCOTROUGH:2,VANCOPEAK:2,VANCORANDOM:2,GENTTROUGH:2,GENTPEAK:2,GENTRANDOM:2,TOBRATROUGH:2,TOBRAPEAK:2,TOBRARND:2,AMIKACINPEAK:2,AMIKACINTROU:2,AMIKACIN:2, in the last 72 hours   Microbiology: No results found for this or any previous visit (from the past 720 hour(s)).  Medical History: Past Medical History  Diagnosis Date  . Hypertension   . Anxiety     Medications:  Prescriptions prior to admission  Medication Sig Dispense Refill  . Ascorbic Acid (VITAMIN C PO) Take 1 tablet by mouth daily.      Marland Kitchen aspirin 81 MG tablet Take 81 mg by mouth daily.      . Cholecalciferol (VITAMIN D PO) Take 1 tablet by mouth daily.      Marland Kitchen diltiazem (DILACOR XR) 240 MG 24 hr capsule Take 240 mg by mouth daily.      Marland Kitchen lisinopril-hydrochlorothiazide (PRINZIDE,ZESTORETIC) 20-25 MG per tablet Take 1 tablet by mouth daily.      Marland Kitchen omeprazole (PRILOSEC) 20 MG capsule Take 20 mg by mouth 2 (two) times daily.       Assessment: 67 yo lady to start empiric zosyn for n/v, fever and abdominal pain.  She has an ileus on CT.  CrCl ~ 83 ml/min. WBC 33.8.  Goal of Therapy:  Eradication of infection  Plan:  Zosyn 3.375 gm IV q8 hours. F/u clinical course and  cultures.  Joie Hipps Poteet 06/01/2012,10:50 PM

## 2012-06-01 NOTE — ED Provider Notes (Signed)
Medical screening examination/treatment/procedure(s) were conducted as a shared visit with non-physician practitioner(s) and myself.  I personally evaluated the patient during the encounter  The patient continues to have abdominal pain.  Her CT scan demonstrates ileus.  She continues to have some nausea while in the emergency department.  The patient will benefit from admission   Lyanne Co, MD 06/01/12 [phone]

## 2012-06-01 NOTE — ED Notes (Signed)
Pt transported to CT ?

## 2012-06-01 NOTE — ED Provider Notes (Signed)
History     CSN: [id number]  Arrival date & time 06/01/12  1038   First MD Initiated Contact with Patient 06/01/12 1043      No chief complaint on file.   (Consider location/radiation/quality/duration/timing/severity/associated sxs/prior treatment) HPI  67 year old female presents with flulike symptoms. Patient reports since last night she has developed chills, nonproductive coughing, nausea, multiple bouts of nonbloody bloody, nonbilious vomiting, and nonbloody non-mucous diarrhea.  She also endorsed diffuse abdominal tenderness. Describe pain as 9/10, achy sensation, nonradiating, persistent, mildly relieved with diarrhea. She also endorsed lightheadedness, generalized body aches, and decrease in appetite. She also endorsed urinary frequency, and urgency without burning urination. No specific treatment tried. No recent travel. No recent sick contact. Patient denies fever, sneezing, nasal congestion, sore throat, back pain, numbness, or rash.  Pt received her flu shot for this year.  No past medical history on file.  No past surgical history on file.  No family history on file.  History  Substance Use Topics  . Smoking status: Not on file  . Smokeless tobacco: Not on file  . Alcohol Use: Not on file    OB History    No data available      Review of Systems  Constitutional:       10 Systems reviewed and all are negative for acute change except as noted in the HPI.     Allergies  Review of patient's allergies indicates no known allergies.  Home Medications   Current Outpatient Rx  Name  Route  Sig  Dispense  Refill  . LISINOPRIL PO   Oral   Take by mouth.           There were no vitals taken for this visit.  Physical Exam  Nursing note and vitals reviewed. Constitutional: She is oriented to person, place, and time. She appears well-developed and well-nourished. She appears distressed (Patient appears uncomfortable, moaning, diaphoretic).  HENT:  Head:  Atraumatic.  Right Ear: External ear normal.  Left Ear: External ear normal.       Mouth is dry  Eyes: Conjunctivae normal are normal. Right eye exhibits no discharge. Left eye exhibits no discharge.  Neck: Neck supple.  Cardiovascular: Normal rate and regular rhythm.   Pulmonary/Chest: Effort normal. No respiratory distress. She exhibits no tenderness.  Abdominal: Soft. Bowel sounds are normal. There is tenderness (Mild Diffuse abdominal tenderness, without guarding or rebound tenderness. No overlying skin changes. No hernia noted.). There is no rebound and no guarding.       Hyperactive bowel sounds  Genitourinary: Guaiac negative stool.       Chaperone present:  Normal rectal tone, external hemorrhoids,   Musculoskeletal: She exhibits no edema and no tenderness.       ROM appears intact, no obvious focal weakness  Neurological: She is alert and oriented to person, place, and time.       Mental status and motor strength appears intact  Skin: No rash noted.  Psychiatric: She has a normal mood and affect.    ED Course  Procedures (including critical care time)  Labs Reviewed - No data to display No results found.   No diagnosis found.  Results for orders placed during the hospital encounter of 06/01/12  CBC WITH DIFFERENTIAL      Component Value Range   WBC 19.9 (*) 4.0 - 10.5 K/uL   RBC 5.66 (*) 3.87 - 5.11 MIL/uL   Hemoglobin 14.0  12.0 - 15.0 g/dL   HCT 40.9  81.1 -  46.0 %   MCV 74.2 (*) 78.0 - 100.0 fL   MCH 24.7 (*) 26.0 - 34.0 pg   MCHC 33.3  30.0 - 36.0 g/dL   RDW 16.1 (*) 09.6 - 04.5 %   Platelets 444 (*) 150 - 400 K/uL   Neutrophils Relative 90 (*) 43 - 77 %   Neutro Abs 18.0 (*) 1.7 - 7.7 K/uL   Lymphocytes Relative 7 (*) 12 - 46 %   Lymphs Abs 1.3  0.7 - 4.0 K/uL   Monocytes Relative 3  3 - 12 %   Monocytes Absolute 0.5  0.1 - 1.0 K/uL   Eosinophils Relative 0  0 - 5 %   Eosinophils Absolute 0.0  0.0 - 0.7 K/uL   Basophils Relative 0  0 - 1 %   Basophils  Absolute 0.0  0.0 - 0.1 K/uL  LIPASE, BLOOD      Component Value Range   Lipase 20  11 - 59 U/L  COMPREHENSIVE METABOLIC PANEL      Component Value Range   Sodium 140  135 - 145 mEq/L   Potassium 3.7  3.5 - 5.1 mEq/L   Chloride 102  96 - 112 mEq/L   CO2 25  19 - 32 mEq/L   Glucose, Bld 144 (*) 70 - 99 mg/dL   BUN 12  6 - 23 mg/dL   Creatinine, Ser 4.09  0.50 - 1.10 mg/dL   Calcium 9.8  8.4 - 81.1 mg/dL   Total Protein 8.0  6.0 - 8.3 g/dL   Albumin 3.5  3.5 - 5.2 g/dL   AST 12  0 - 37 U/L   ALT 8  0 - 35 U/L   Alkaline Phosphatase 91  39 - 117 U/L   Total Bilirubin 0.3  0.3 - 1.2 mg/dL   GFR calc non Af Amer >90  >90 mL/min   GFR calc Af Amer >90  >90 mL/min  OCCULT BLOOD, POC DEVICE      Component Value Range   Fecal Occult Bld NEGATIVE  NEGATIVE   Dg Chest 2 View  06/01/2012  *RADIOLOGY REPORT*  Clinical Data: Flu-like symptoms  CHEST - 2 VIEW  Comparison: 05/24/2009  Findings: Normal heart size.  No pleural effusion or edema.  The lung volumes are low.  Asymmetric elevation of the left hemidiaphragm is noted.  Scarring noted in the left lower lobe.  IMPRESSION:  1.  Low lung volumes with asymmetric elevation of the right hemidiaphragm.   Original Report Authenticated By: Signa Kell, M.D.    Ct Abdomen Pelvis W Contrast  06/01/2012  *RADIOLOGY REPORT*  Clinical Data: Nausea, vomiting, diarrhea, 52  CT ABDOMEN AND PELVIS WITH CONTRAST  Technique:  Multidetector CT imaging of the abdomen and pelvis was performed following the standard protocol during bolus administration of intravenous contrast.  Contrast: OMNIPAQUE IOHEXOL 300 MG/ML  SOLN  Comparison: None.  Findings: Post cholecystectomy.  Liver, pancreas, spleen, right adrenal gland are within normal limits.  1.3 cm left adrenal nodule is indeterminate.  Benign-appearing cyst in the left kidney.  Indeterminate hypodensities in the right kidney.  There is a central hypodensity in the right kidney on image 36.  Nondistended air  and fluid-filled small bowel loops are scattered across abdomen with air-fluid levels.  No transition point to suggest obstruction.  Diverticulosis of the descending and sigmoid colon is present without definite evidence of acute diverticulitis.  Small amount of free fluid is seen within the mesentery on images 62-66.  There is also fluid extending from the tip of the liver and along the right paracolic gutter with an unusual and lobulated appearance.  Loculated fluid is suggestive of unknown significance.  Advanced degenerative disc disease with facet arthropathy is present in the lumbar spine.  An element of spinal stenosis occurs at L4-5.  Atherosclerotic changes of the aorta, mid SMA, and iliac arteries are noted.  No obvious focal significant stenosis.  Unremarkable bladder.  Uterus absent.  No obvious ovarian mass.  IMPRESSION: Small bowel air fluid levels in an ileus pattern and no definite transition point.  Small amount of free fluid in the mesentery suggesting an inflammatory process.  Nonspecific 1.3 cm left adrenal nodule.  If there is a history of malignancy or strong concern for malignancy, MRI can be performed to further characterize.  Loculated peritoneal fluid along the right paracolic gutter of unknown significance.  No obvious omental caking or enhancing peritoneal mass.  Degenerative changes in the lumbar spine.   Original Report Authenticated By: Jolaine Click, M.D.     1. Abdominal pain 2. Leukocytosis 3. ileus 4. Flu-like sxs  MDM  Patient presents with abdominal pain with an associate nausea, vomiting, diarrhea, likely to be viral etiology. Workup initiated.   2:50 PM Continues to endorse nausea and abd pain.  Treatment given.  Pt has WBC 19.9 with left shift.  CT of abd/pelvic shows small bowel air fluid levels in an ileus pattern.  There are small amount of free fluid in the mesentery suggesting an inflammatory process.  There are also nonspecific 1.3cm L adrenal nodule.     3:44 PM I have consult [facility] who agrees to admit pt to obs, med surge, Team 7, under the care of Dr. Ardyth Harps.  BP 160/75  Pulse 124  Temp 98 F (36.7 C) (Oral)  Resp 20  SpO2 94%  I have reviewed nursing notes and vital signs. I personally reviewed the imaging tests through PACS system  I reviewed available ER/hospitalization records thought the EMR    Fayrene Helper, PA-C 06/01/12 1550

## 2012-06-01 NOTE — Progress Notes (Signed)
Notified on-call hospitalist that pt is sustaining HR in the 150's. 12 lead EKG obtained which revealed SVT. Metoprolol, ativan, and zosyn ordered. Phenergan given. Will continue to monitor.

## 2012-06-01 NOTE — ED Notes (Signed)
Pt with c/o nausea, vomiting, diarrhea, fever fatigue and weakness since last night

## 2012-06-01 NOTE — H&P (Signed)
[facility]          History and Physical    PCP:   [name], MD   Chief Complaint:  Abdominal pain, n/v, chills.  HPI: 67 y/o woman with PMH only significant for HTN who presents with a 24 hour history of subjective fevers, chills, diffuse abdominal pain, n/v, body aches. She continues to have intractable n/v despite treatment in the ED and we have been asked to admit her for further treatment. CT scan in the ED was remarkable for ileus.   Allergies:  No Known Allergies    Past Medical History  Diagnosis Date  . Hypertension   . Anxiety     Past Surgical History  Procedure Date  . Cholecystectomy     Prior to Admission medications   Medication Sig Start Date End Date Taking? Authorizing Provider  Ascorbic Acid (VITAMIN C PO) Take 1 tablet by mouth daily.   Yes Historical Provider, MD  aspirin 81 MG tablet Take 81 mg by mouth daily.   Yes Historical Provider, MD  Cholecalciferol (VITAMIN D PO) Take 1 tablet by mouth daily.   Yes Historical Provider, MD  diltiazem (DILACOR XR) 240 MG 24 hr capsule Take 240 mg by mouth daily.   Yes Historical Provider, MD  lisinopril-hydrochlorothiazide (PRINZIDE,ZESTORETIC) 20-25 MG per tablet Take 1 tablet by mouth daily.   Yes Historical Provider, MD  omeprazole (PRILOSEC) 20 MG capsule Take 20 mg by mouth 2 (two) times daily.   Yes Historical Provider, MD    Social History:  reports that she has never smoked. She does not have any smokeless tobacco history on file. She reports that she does not drink alcohol or use illicit drugs.  History reviewed. No pertinent family history.  Review of Systems:  Constitutional: Positive for fever, chills, diaphoresis, appetite change and fatigue.  HEENT: Denies photophobia, eye pain, redness, hearing loss, ear pain, congestion, sore throat, rhinorrhea, sneezing, mouth sores, trouble swallowing, neck pain, neck stiffness and tinnitus.   Respiratory: Denies SOB, DOE, cough, chest  tightness,  and wheezing.   Cardiovascular: Denies chest pain, palpitations and leg swelling.  Gastrointestinal:Positive for nausea, vomiting, abdominal pain, diarrhea. Genitourinary: Denies dysuria, urgency, frequency, hematuria, flank pain and difficulty urinating.  Musculoskeletal: Denies  back pain, joint swelling, arthralgias and gait problem.  Skin: Denies pallor, rash and wound.  Neurological: Denies dizziness, seizures, syncope, weakness, light-headedness, numbness and headaches.  Hematological: Denies adenopathy. Easy bruising, personal or family bleeding history  Psychiatric/Behavioral: Denies suicidal ideation, mood changes, confusion, nervousness, sleep disturbance and agitation   Physical Exam: Blood pressure 157/90, pulse 134, temperature 97.5 F (36.4 C), temperature source Oral, resp. rate 20, height 5\' 5"  (1.651 m), weight 108.8 kg (239 lb 13.8 oz), SpO2 95.00%. Gen: AA Ox3, appears acutely ill. HEENT: [place]/AT/PERRL/EOMI, dry mucous membranes. Neck: supple, no JVD, no LAD, no bruits, no goiter. CV: RRR, no M/R/G Lungs: CTA B Abd: S/ND/+BS/diffusely TTP/no masses or organomegaly. Ext: no C/C/E, +pedal pulses Neuro: grossly intact and non-focal.   Labs on Admission:  Results for orders placed during the hospital encounter of 06/01/12 (from the past 48 hour(s))  CBC WITH DIFFERENTIAL     Status: Abnormal   Collection Time   06/01/12 11:19 AM      Component Value Range Comment   WBC 19.9 (*) 4.0 - 10.5 K/uL    RBC 5.66 (*) 3.87 - 5.11 MIL/uL    Hemoglobin 14.0  12.0 - 15.0 g/dL    HCT 16.1  09.6 -  46.0 %    MCV 74.2 (*) 78.0 - 100.0 fL    MCH 24.7 (*) 26.0 - 34.0 pg    MCHC 33.3  30.0 - 36.0 g/dL    RDW 40.9 (*) 81.1 - 15.5 %    Platelets 444 (*) 150 - 400 K/uL    Neutrophils Relative 90 (*) 43 - 77 %    Neutro Abs 18.0 (*) 1.7 - 7.7 K/uL    Lymphocytes Relative 7 (*) 12 - 46 %    Lymphs Abs 1.3  0.7 - 4.0 K/uL    Monocytes Relative 3  3 - 12 %    Monocytes  Absolute 0.5  0.1 - 1.0 K/uL    Eosinophils Relative 0  0 - 5 %    Eosinophils Absolute 0.0  0.0 - 0.7 K/uL    Basophils Relative 0  0 - 1 %    Basophils Absolute 0.0  0.0 - 0.1 K/uL   LIPASE, BLOOD     Status: Normal   Collection Time   06/01/12 12:55 PM      Component Value Range Comment   Lipase 20  11 - 59 U/L   COMPREHENSIVE METABOLIC PANEL     Status: Abnormal   Collection Time   06/01/12 12:55 PM      Component Value Range Comment   Sodium 140  135 - 145 mEq/L    Potassium 3.7  3.5 - 5.1 mEq/L    Chloride 102  96 - 112 mEq/L    CO2 25  19 - 32 mEq/L    Glucose, Bld 144 (*) 70 - 99 mg/dL    BUN 12  6 - 23 mg/dL    Creatinine, Ser 9.14  0.50 - 1.10 mg/dL    Calcium 9.8  8.4 - 78.2 mg/dL    Total Protein 8.0  6.0 - 8.3 g/dL    Albumin 3.5  3.5 - 5.2 g/dL    AST 12  0 - 37 U/L    ALT 8  0 - 35 U/L    Alkaline Phosphatase 91  39 - 117 U/L    Total Bilirubin 0.3  0.3 - 1.2 mg/dL    GFR calc non Af Amer >90  >90 mL/min    GFR calc Af Amer >90  >90 mL/min   OCCULT BLOOD, POC DEVICE     Status: Normal   Collection Time   06/01/12  3:02 PM      Component Value Range Comment   Fecal Occult Bld NEGATIVE  NEGATIVE   URINALYSIS, ROUTINE W REFLEX MICROSCOPIC     Status: Abnormal   Collection Time   06/01/12  3:07 PM      Component Value Range Comment   Color, Urine YELLOW  YELLOW    APPearance CLEAR  CLEAR    Specific Gravity, Urine 1.015  1.005 - 1.030    pH 6.0  5.0 - 8.0    Glucose, UA NEGATIVE  NEGATIVE mg/dL    Hgb urine dipstick NEGATIVE  NEGATIVE    Bilirubin Urine NEGATIVE  NEGATIVE    Ketones, ur 15 (*) NEGATIVE mg/dL    Protein, ur NEGATIVE  NEGATIVE mg/dL    Urobilinogen, UA 1.0  0.0 - 1.0 mg/dL    Nitrite NEGATIVE  NEGATIVE    Leukocytes, UA NEGATIVE  NEGATIVE MICROSCOPIC NOT DONE ON URINES WITH NEGATIVE PROTEIN, BLOOD, LEUKOCYTES, NITRITE, OR GLUCOSE <1000 mg/dL.    Radiological Exams on Admission: Dg Chest 2 View  06/01/2012  *  RADIOLOGY REPORT*  Clinical Data:  Flu-like symptoms  CHEST - 2 VIEW  Comparison: 05/24/2009  Findings: Normal heart size.  No pleural effusion or edema.  The lung volumes are low.  Asymmetric elevation of the left hemidiaphragm is noted.  Scarring noted in the left lower lobe.  IMPRESSION:  1.  Low lung volumes with asymmetric elevation of the right hemidiaphragm.   Original Report Authenticated By: Signa Kell, M.D.    Ct Abdomen Pelvis W Contrast  06/01/2012  *RADIOLOGY REPORT*  Clinical Data: Nausea, vomiting, diarrhea, 52  CT ABDOMEN AND PELVIS WITH CONTRAST  Technique:  Multidetector CT imaging of the abdomen and pelvis was performed following the standard protocol during bolus administration of intravenous contrast.  Contrast: OMNIPAQUE IOHEXOL 300 MG/ML  SOLN  Comparison: None.  Findings: Post cholecystectomy.  Liver, pancreas, spleen, right adrenal gland are within normal limits.  1.3 cm left adrenal nodule is indeterminate.  Benign-appearing cyst in the left kidney.  Indeterminate hypodensities in the right kidney.  There is a central hypodensity in the right kidney on image 36.  Nondistended air and fluid-filled small bowel loops are scattered across abdomen with air-fluid levels.  No transition point to suggest obstruction.  Diverticulosis of the descending and sigmoid colon is present without definite evidence of acute diverticulitis.  Small amount of free fluid is seen within the mesentery on images 62-66.  There is also fluid extending from the tip of the liver and along the right paracolic gutter with an unusual and lobulated appearance.  Loculated fluid is suggestive of unknown significance.  Advanced degenerative disc disease with facet arthropathy is present in the lumbar spine.  An element of spinal stenosis occurs at L4-5.  Atherosclerotic changes of the aorta, mid SMA, and iliac arteries are noted.  No obvious focal significant stenosis.  Unremarkable bladder.  Uterus absent.  No obvious ovarian mass.  IMPRESSION:  Small bowel air fluid levels in an ileus pattern and no definite transition point.  Small amount of free fluid in the mesentery suggesting an inflammatory process.  Nonspecific 1.3 cm left adrenal nodule.  If there is a history of malignancy or strong concern for malignancy, MRI can be performed to further characterize.  Loculated peritoneal fluid along the right paracolic gutter of unknown significance.  No obvious omental caking or enhancing peritoneal mass.  Degenerative changes in the lumbar spine.   Original Report Authenticated By: Jolaine Click, M.D.     Assessment/Plan Principal Problem:  *Abdominal pain Active Problems:  Nausea & vomiting  Body aches  Flu-like symptoms  Ileus  HTN (hypertension)  Adrenal nodule   Abdominal Pain -Likely related to ileus seen on CT. -Clear liquids, advance diet as tolerated.  Flu-like Symptoms -Has had fever, chills, myalgias, n/v. -No true upper respiratory symptoms. -Will check influenza PCR and place on droplet precautions. -Will empirically start on tamiflu; can DC if PCR results negative.  Adrenal Nodule -Found incidentally on CT scan. -Can further work up as an OP.  DVT Prophylaxis -Lovenox.  Code Status -Full code; discussed with patient and daughter at bedside.  Time Spent on Admission: 75 minutes  HERNANDEZ ACOSTA,Marks Scalera [facility] Pager: [phone] 06/01/2012, 5:11 PM

## 2012-06-02 ENCOUNTER — Observation Stay (HOSPITAL_COMMUNITY): Payer: Medicare Other

## 2012-06-02 DIAGNOSIS — R109 Unspecified abdominal pain: Secondary | ICD-10-CM

## 2012-06-02 DIAGNOSIS — E872 Acidosis: Secondary | ICD-10-CM

## 2012-06-02 DIAGNOSIS — R112 Nausea with vomiting, unspecified: Secondary | ICD-10-CM

## 2012-06-02 DIAGNOSIS — R000 Tachycardia, unspecified: Secondary | ICD-10-CM

## 2012-06-02 LAB — INFLUENZA PANEL BY PCR (TYPE A & B): Influenza A By PCR: NEGATIVE

## 2012-06-02 LAB — BLOOD GAS, ARTERIAL
Acid-base deficit: 6.1 mmol/L — ABNORMAL HIGH (ref 0.0–2.0)
Bicarbonate: 17.1 mEq/L — ABNORMAL LOW (ref 20.0–24.0)
O2 Saturation: 96.7 %
Patient temperature: 98.6
TCO2: 20.9 mmol/L (ref 0–100)
pH, Arterial: 7.405 (ref 7.350–7.450)
pO2, Arterial: 87 mmHg (ref 80.0–100.0)

## 2012-06-02 LAB — CBC WITH DIFFERENTIAL/PLATELET
Basophils Absolute: 0 10*3/uL (ref 0.0–0.1)
Basophils Relative: 0 % (ref 0–1)
Eosinophils Absolute: 0 10*3/uL (ref 0.0–0.7)
MCH: 24.6 pg — ABNORMAL LOW (ref 26.0–34.0)
MCHC: 33.4 g/dL (ref 30.0–36.0)
Neutrophils Relative %: 85 % — ABNORMAL HIGH (ref 43–77)
Platelets: 413 10*3/uL — ABNORMAL HIGH (ref 150–400)
RBC: 5.68 MIL/uL — ABNORMAL HIGH (ref 3.87–5.11)

## 2012-06-02 LAB — COMPREHENSIVE METABOLIC PANEL
ALT: 10 U/L (ref 0–35)
AST: 15 U/L (ref 0–37)
Albumin: 2.4 g/dL — ABNORMAL LOW (ref 3.5–5.2)
Alkaline Phosphatase: 64 U/L (ref 39–117)
Potassium: 3.5 mEq/L (ref 3.5–5.1)
Sodium: 142 mEq/L (ref 135–145)
Total Protein: 5.9 g/dL — ABNORMAL LOW (ref 6.0–8.3)

## 2012-06-02 LAB — TSH: TSH: 0.487 u[IU]/mL (ref 0.350–4.500)

## 2012-06-02 LAB — PROTIME-INR: Prothrombin Time: 17.1 seconds — ABNORMAL HIGH (ref 11.6–15.2)

## 2012-06-02 LAB — CLOSTRIDIUM DIFFICILE BY PCR: Toxigenic C. Difficile by PCR: NEGATIVE

## 2012-06-02 MED ORDER — VANCOMYCIN HCL 10 G IV SOLR
2500.0000 mg | Freq: Once | INTRAVENOUS | Status: AC
  Administered 2012-06-02: 2500 mg via INTRAVENOUS
  Filled 2012-06-02: qty 2500

## 2012-06-02 MED ORDER — EPINEPHRINE HCL 1 MG/ML IJ SOLN
INTRAMUSCULAR | Status: AC
  Filled 2012-06-02: qty 1

## 2012-06-02 MED ORDER — WHITE PETROLATUM GEL
Status: AC
  Administered 2012-06-02: 23:00:00
  Filled 2012-06-02: qty 5

## 2012-06-02 MED ORDER — SODIUM CHLORIDE 0.9 % IV BOLUS (SEPSIS)
1000.0000 mL | Freq: Once | INTRAVENOUS | Status: AC
  Administered 2012-06-02: 1000 mL via INTRAVENOUS

## 2012-06-02 MED ORDER — METRONIDAZOLE IN NACL 5-0.79 MG/ML-% IV SOLN
500.0000 mg | Freq: Three times a day (TID) | INTRAVENOUS | Status: DC
  Administered 2012-06-02 – 2012-06-04 (×7): 500 mg via INTRAVENOUS
  Filled 2012-06-02 (×11): qty 100

## 2012-06-02 MED ORDER — VANCOMYCIN HCL IN DEXTROSE 1-5 GM/200ML-% IV SOLN
1000.0000 mg | Freq: Two times a day (BID) | INTRAVENOUS | Status: DC
  Administered 2012-06-02 – 2012-06-08 (×11): 1000 mg via INTRAVENOUS
  Filled 2012-06-02 (×13): qty 200

## 2012-06-02 MED ORDER — METOPROLOL TARTRATE 1 MG/ML IV SOLN
INTRAVENOUS | Status: AC
  Filled 2012-06-02: qty 5

## 2012-06-02 MED ORDER — DILTIAZEM HCL 100 MG IV SOLR
5.0000 mg/h | INTRAVENOUS | Status: DC
  Filled 2012-06-02: qty 100

## 2012-06-02 MED ORDER — HEPARIN SODIUM (PORCINE) 5000 UNIT/ML IJ SOLN
5000.0000 [IU] | Freq: Three times a day (TID) | INTRAMUSCULAR | Status: DC
  Administered 2012-06-02 – 2012-06-11 (×28): 5000 [IU] via SUBCUTANEOUS
  Filled 2012-06-02 (×32): qty 1

## 2012-06-02 MED ORDER — DILTIAZEM HCL 25 MG/5ML IV SOLN
10.0000 mg | Freq: Once | INTRAVENOUS | Status: DC
  Filled 2012-06-02: qty 5

## 2012-06-02 MED ORDER — METOPROLOL TARTRATE 1 MG/ML IV SOLN
INTRAVENOUS | Status: AC
  Administered 2012-06-02: 5 mg via INTRAVENOUS
  Filled 2012-06-02: qty 5

## 2012-06-02 MED ORDER — SODIUM CHLORIDE 0.9 % IV BOLUS (SEPSIS)
250.0000 mL | Freq: Once | INTRAVENOUS | Status: AC
  Administered 2012-06-02: 250 mL via INTRAVENOUS

## 2012-06-02 MED ORDER — METOPROLOL TARTRATE 1 MG/ML IV SOLN
5.0000 mg | INTRAVENOUS | Status: DC | PRN
  Administered 2012-06-02 – 2012-06-03 (×2): 5 mg via INTRAVENOUS
  Filled 2012-06-02 (×4): qty 5

## 2012-06-02 NOTE — Progress Notes (Signed)
Pt examined and chart reviewed. I was notified by nursing staff of patient's heart rate being sustained in the 150s. EKG showed SVT. Pt has been vomiting from time to time, abdominal pain still 7/10. Lungs were clear, heart tachy but regular. RLQ tenderness to palpation but no peritoneal signs. Of note patients WBC increased to 33. She has been afebrile and HR has been elevated. Pt takes cardizem at home but no documented history of arrhythmia/afib. She has not had this in about 24 hours because of her vomiting. Will attempt PRN lopressor and/or cardizem to normalize HR and may need cardizem drip at some point. Will also add zosyn because of tachycardia and increasing WBC count, especially given the loculated fluid collection in pelvis noted on CT.

## 2012-06-02 NOTE — Progress Notes (Signed)
Notified on call of increased HR to the 150's again (sustaining) after giving lopressor at 2357. Pt is sweating, but in no pain. Lance Coon will place stepdown orders and pt will be placed on cardizem drip.

## 2012-06-02 NOTE — Progress Notes (Signed)
Subjective: Awake and alert. She says she feels better  Objective: Vital signs in last 24 hours: Temp:  [97.4 F (36.3 C)-98 F (36.7 C)] 97.5 F (36.4 C) (01/18 0400) Pulse Rate:  [87-158] 146  (01/18 0700) Resp:  [17-56] 34  (01/18 0700) BP: (102-162)/(52-102) 132/75 mmHg (01/18 0700) SpO2:  [94 %-100 %] 98 % (01/18 0700) Weight:  [239 lb 13.8 oz (108.8 kg)] 239 lb 13.8 oz (108.8 kg) (01/17 1648) Last BM Date: 06/01/12  Intake/Output from previous day: 01/17 0701 - 01/18 0700 In: 3762.5 [I.V.:100; NG/GT:3550; IV Piggyback:112.5] Out: 300 [Urine:300] Intake/Output this shift:    GI: soft, less tender. no guarding or peritonitis  Lab Results:   Basename 06/02/12 0550 06/01/12 1723  WBC 17.6* 33.8*  HGB 14.0 15.7*  HCT 41.9 47.5*  PLT 413* 532*   BMET  Basename 06/02/12 0550 06/01/12 2155  NA 142 138  K 3.5 3.4*  CL 109 100  CO2 19 18*  GLUCOSE 146* 203*  BUN 24* 17  CREATININE 1.01 0.79  CALCIUM 8.3* 10.0   PT/INR  Basename 06/02/12 0550  LABPROT 17.1*  INR 1.43   ABG  Basename 06/02/12 0600 06/02/12 0245  PHART 7.405 7.455*  HCO3 19.9* 17.1*    Studies/Results: Dg Chest 2 View  06/01/2012  *RADIOLOGY REPORT*  Clinical Data: Flu-like symptoms  CHEST - 2 VIEW  Comparison: 05/24/2009  Findings: Normal heart size.  No pleural effusion or edema.  The lung volumes are low.  Asymmetric elevation of the left hemidiaphragm is noted.  Scarring noted in the left lower lobe.  IMPRESSION:  1.  Low lung volumes with asymmetric elevation of the right hemidiaphragm.   Original Report Authenticated By: Signa Kell, M.D.    Ct Abdomen Pelvis W Contrast  06/01/2012  *RADIOLOGY REPORT*  Clinical Data: Nausea, vomiting, diarrhea, 52  CT ABDOMEN AND PELVIS WITH CONTRAST  Technique:  Multidetector CT imaging of the abdomen and pelvis was performed following the standard protocol during bolus administration of intravenous contrast.  Contrast: OMNIPAQUE IOHEXOL 300  MG/ML  SOLN  Comparison: None.  Findings: Post cholecystectomy.  Liver, pancreas, spleen, right adrenal gland are within normal limits.  1.3 cm left adrenal nodule is indeterminate.  Benign-appearing cyst in the left kidney.  Indeterminate hypodensities in the right kidney.  There is a central hypodensity in the right kidney on image 36.  Nondistended air and fluid-filled small bowel loops are scattered across abdomen with air-fluid levels.  No transition point to suggest obstruction.  Diverticulosis of the descending and sigmoid colon is present without definite evidence of acute diverticulitis.  Small amount of free fluid is seen within the mesentery on images 62-66.  There is also fluid extending from the tip of the liver and along the right paracolic gutter with an unusual and lobulated appearance.  Loculated fluid is suggestive of unknown significance.  Advanced degenerative disc disease with facet arthropathy is present in the lumbar spine.  An element of spinal stenosis occurs at L4-5.  Atherosclerotic changes of the aorta, mid SMA, and iliac arteries are noted.  No obvious focal significant stenosis.  Unremarkable bladder.  Uterus absent.  No obvious ovarian mass.  IMPRESSION: Small bowel air fluid levels in an ileus pattern and no definite transition point.  Small amount of free fluid in the mesentery suggesting an inflammatory process.  Nonspecific 1.3 cm left adrenal nodule.  If there is a history of malignancy or strong concern for malignancy, MRI can be performed to  further characterize.  Loculated peritoneal fluid along the right paracolic gutter of unknown significance.  No obvious omental caking or enhancing peritoneal mass.  Degenerative changes in the lumbar spine.   Original Report Authenticated By: Jolaine Click, M.D.    Dg Chest Port 1 View  06/02/2012  *RADIOLOGY REPORT*  Clinical Data: Placement.  PORTABLE CHEST - 1 VIEW  Comparison: 06/02/2012 at 0435 hours  Findings: Shallow inspiration.   Cardiac enlargement with normal pulmonary vascularity given the technique.  Enteric tube remains unchanged in position in the upper stomach.  Interval placement of a right central venous catheter with tip overlying the cavoatrial junction.  No pneumothorax.  No developing consolidation in the lungs.  Probable atelectasis in the lung bases.  IMPRESSION: Interval placement of right central venous catheter with tip over the cavoatrial junction.   Original Report Authenticated By: Burman Nieves, M.D.    Dg Chest Port 1 View  06/02/2012  *RADIOLOGY REPORT*  Clinical Data: Abdominal pain.  Nausea and vomiting.  PORTABLE CHEST - 1 VIEW  Comparison: 06/01/2012  Findings: Shallow inspiration.  Mild cardiac enlargement with normal pulmonary vascularity.  Linear fibrosis in the left mid lung.  No focal airspace consolidation.  No pneumothorax.  No free intra-abdominal air is noted.  Since the previous study, an enteric tube has been placed with tip below the left hemidiaphragm consistent with location in the lower stomach.  Otherwise no significant change.  IMPRESSION: Shallow inspiration with mild cardiac enlargement.  No active consolidation.   Original Report Authenticated By: Burman Nieves, M.D.    Dg Abd Portable 1v  06/02/2012  *RADIOLOGY REPORT*  Clinical Data: Concern for perforated bowel.  Abdominal pain.  PORTABLE ABDOMEN - 1 VIEW  Comparison: CT abdomen and pelvis 06/01/2012.  The  Findings: Limited upright portable view of the abdomen demonstrates no evidence of free intra-abdominal air.  The lower abdomen is not well included and bowel gas pattern cannot be commented upon. The study is technically limited due to motion artifact.  IMPRESSION: No free air demonstrated on limited upright view.   Original Report Authenticated By: Burman Nieves, M.D.    Dg Abd Portable 2v  06/02/2012  *RADIOLOGY REPORT*  Clinical Data: Abdominal pain.  Nausea and vomiting.  PORTABLE ABDOMEN - 2 VIEW  Comparison:  06/02/2012 at 0253 hours.  Findings: Scattered gas and stool throughout the colon.  No small or large bowel distension.  No free intra-abdominal air.  There are small air-fluid levels suggesting strain of [place] which can be associated with dilated fluid-filled bowel.  Obstruction is not excluded.  If in the colon, this suggest liquid stool.  This corresponds with multiple fluid filled small bowel loops on previous CT scan earlier today.  Residual contrast material in the bladder from recent CT injection.  Degenerative changes in the lumbar spine and hips.  IMPRESSION: Multiple bead-like air-fluid levels suggesting gas within predominately fluid-filled bowel.  This corresponds to a similar finding on CT.  No free intra-abdominal air.   Original Report Authenticated By: Burman Nieves, M.D.     Anti-infectives: Anti-infectives     Start     Dose/Rate Route Frequency Ordered Stop   06/02/12 1800   vancomycin (VANCOCIN) IVPB 1000 mg/200 mL premix        1,000 mg 200 mL/hr over 60 Minutes Intravenous Every 12 hours 06/02/12 0550     06/02/12 0600   metroNIDAZOLE (FLAGYL) IVPB 500 mg        500 mg 100 mL/hr over 60  Minutes Intravenous Every 8 hours 06/02/12 0547     06/02/12 0600   vancomycin (VANCOCIN) 2,500 mg in sodium chloride 0.9 % 500 mL IVPB        2,500 mg 250 mL/hr over 120 Minutes Intravenous  Once 06/02/12 0550     06/01/12 2300   piperacillin-tazobactam (ZOSYN) IVPB 3.375 g        3.375 g 12.5 mL/hr over 240 Minutes Intravenous Every 8 hours 06/01/12 2250     06/01/12 1730   oseltamivir (TAMIFLU) capsule 75 mg  Status:  Discontinued        75 mg Oral 2 times daily 06/01/12 1713 06/02/12 0337          Assessment/Plan: s/p * No surgery found * continue NG and bowel rest Continue abx No indication for urgent surgery at this point. Will follow  LOS: 1 day    TOTH III,Charmane Protzman S 06/02/2012

## 2012-06-02 NOTE — Procedures (Signed)
Central Venous Catheter Insertion Procedure Note Leotha Westermeyer [id number] 05/09/1946  Procedure: Insertion of Central Venous Catheter Indications: Assessment of intravascular volume, Drug and/or fluid administration and Frequent blood sampling  Procedure Details Consent: Risks of procedure as well as the alternatives and risks of each were explained to the (patient/caregiver).  Consent for procedure obtained. Time Out: Verified patient identification, verified procedure, site/side was marked, verified correct patient position, special equipment/implants available, medications/allergies/relevent history reviewed, required imaging and test results available.  Performed  Maximum sterile technique was used including antiseptics, cap, gloves, gown, hand hygiene, mask and sheet. Skin prep: Chlorhexidine; local anesthetic administered A antimicrobial bonded/coated triple lumen catheter was placed in the right internal jugular vein using the Seldinger technique.  Evaluation Blood flow good Complications: No apparent complications Patient did tolerate procedure well. Chest X-ray ordered to verify placement.  CXR: pending.  Trinette Vera 06/02/2012, 5:39 AM

## 2012-06-02 NOTE — Progress Notes (Signed)
Clamp NG tube; allow a glass of clear liquid and assess symptoms (nausea, vomiting, abdominal pain, abdominal distention); Nursing staff to call e link if concerns of any of the above;

## 2012-06-02 NOTE — Progress Notes (Signed)
ANTIBIOTIC CONSULT NOTE - INITIAL  Pharmacy Consult for vancomycin Indication: rule out sepsis  No Known Allergies  Patient Measurements: Height: 5\' 5"  (165.1 cm) Weight: 239 lb 13.8 oz (108.8 kg) IBW/kg (Calculated) : 57   Vital Signs: Temp: 97.5 F (36.4 C) (01/18 0400) Temp src: Oral (01/18 0400) BP: 138/89 mmHg (01/18 0530) Pulse Rate: 144  (01/18 0530) Intake/Output from previous day: 01/17 0701 - 01/18 0700 In: 3000 [NG/GT:3000] Out: 150 [Urine:150] Intake/Output from this shift: Total I/O In: 3000 [NG/GT:3000] Out: 150 [Urine:150]  Labs:  Basename 06/01/12 2155 06/01/12 1723 06/01/12 1255 06/01/12 1119  WBC -- 33.8* -- 19.9*  HGB -- 15.7* -- 14.0  PLT -- 532* -- 444*  LABCREA -- -- -- --  CREATININE 0.79 0.61 0.53 --   Estimated Creatinine Clearance: 82.6 ml/min (by C-G formula based on Cr of 0.79).   Microbiology: No results found for this or any previous visit (from the past 720 hour(s)).  Medical History: Past Medical History  Diagnosis Date  . Hypertension   . Anxiety     Medications:  Prescriptions prior to admission  Medication Sig Dispense Refill  . Ascorbic Acid (VITAMIN C PO) Take 1 tablet by mouth daily.      Marland Kitchen aspirin 81 MG tablet Take 81 mg by mouth daily.      . Cholecalciferol (VITAMIN D PO) Take 1 tablet by mouth daily.      Marland Kitchen diltiazem (DILACOR XR) 240 MG 24 hr capsule Take 240 mg by mouth daily.      Marland Kitchen lisinopril-hydrochlorothiazide (PRINZIDE,ZESTORETIC) 20-25 MG per tablet Take 1 tablet by mouth daily.      Marland Kitchen omeprazole (PRILOSEC) 20 MG capsule Take 20 mg by mouth 2 (two) times daily.       Scheduled:    . aspirin EC  81 mg Oral Daily  . heparin subcutaneous  5,000 Units Subcutaneous Q8H  . [COMPLETED]  HYDROmorphone (DILAUDID) injection  0.5 mg Intravenous Once  . [COMPLETED]  HYDROmorphone (DILAUDID) injection  1 mg Intravenous Once  . [COMPLETED] iohexol  50 mL Oral Q1 Hr x 2  . [COMPLETED] metoCLOPramide (REGLAN) injection   10 mg Intravenous Once  . [COMPLETED] metoprolol  5 mg Intravenous NOW  . metronidazole  500 mg Intravenous Q8H  . [COMPLETED]  morphine injection  4 mg Intravenous Once  . [COMPLETED] ondansetron  4 mg Intravenous Once  . [COMPLETED] ondansetron  4 mg Intravenous Once  . pantoprazole (PROTONIX) IV  40 mg Intravenous Q24H  . piperacillin-tazobactam (ZOSYN)  IV  3.375 g Intravenous Q8H  . pneumococcal 23 valent vaccine  0.5 mL Intramuscular Tomorrow-1000  . [COMPLETED] sodium chloride  1,000 mL Intravenous Once  . [COMPLETED] sodium chloride  1,000 mL Intravenous Once  . [COMPLETED] sodium chloride  1,000 mL Intravenous Once  . [COMPLETED] sodium chloride  1,000 mL Intravenous Once  . sodium chloride  1,000 mL Intravenous Once  . vancomycin  2,500 mg Intravenous Once  . vancomycin  1,000 mg Intravenous Q12H  . [DISCONTINUED] sodium chloride   Intravenous STAT  . [DISCONTINUED] aspirin  81 mg Oral Daily  . [DISCONTINUED] diltiazem  10 mg Intravenous Once  . [DISCONTINUED] diltiazem  240 mg Oral Daily  . [DISCONTINUED] enoxaparin (LOVENOX) injection  40 mg Subcutaneous Q24H  . [DISCONTINUED] hydrochlorothiazide  25 mg Oral Daily  . [DISCONTINUED] lisinopril  20 mg Oral Daily  . [DISCONTINUED] lisinopril-hydrochlorothiazide  1 tablet Oral Daily  . [DISCONTINUED] oseltamivir  75 mg Oral BID  . [  DISCONTINUED] pantoprazole  40 mg Oral Daily   Infusions:    . sodium chloride 100 mL/hr at 06/01/12 1856  . [DISCONTINUED] diltiazem (CARDIZEM) infusion      Assessment: 67yo female admitted yesterday am for ileus, earlier tonight pt tx'd to ICU for sustained tachycardia, lactic acid noted to be elevated, concern for sepsis vs underlying abdominal process, already begun on Zosyn, now to add vancomycin.  Goal of Therapy:  Vancomycin trough level 15-20 mcg/ml  Plan:  Will give vancomycin 2500mg  IV x1 followed by 1000mg  IV Q12H and monitor CBC, Cx, levels prn.  Colleen Can PharmD  BCPS 06/02/2012,5:51 AM

## 2012-06-02 NOTE — Progress Notes (Signed)
PULMONARY  / CRITICAL CARE MEDICINE  Name: Aftan Vint MRN: [id number] DOB: 16-Jan-1946    LOS: 1  REFERRING MD :  Roland Rack Hospitalist  CHIEF COMPLAINT:  Nausea, vomiting  BRIEF PATIENT DESCRIPTION: 67 yo F with HTN with nausea, vomiting, diffuse abdominal pain transferred to ICU for increasing tachycardia, tachypnea and lactic acidosis.  LINES / TUBES: R IJ 06/02/12 >>>  CULTURES: BLood cx ordered 1/18  ANTIBIOTICS: Zosyn 1/18 >>>  SIGNIFICANT EVENTS:  1/18 transferred to ICU  LEVEL OF CARE:  ICCU PRIMARY SERVICE:  PCCM CONSULTANTS:  Surgery CODE STATUS Full Code DIET:  NPO DVT Px:  SQ Heparin GI Px:  PPI  HISTORY OF PRESENT ILLNESS:  67 yo F with HTN who was admitted yesterday with nausea, vomiting and diffuse abdominal pain.  CT scan was obtained showing possible ileus but no other pathology. Overnight she developed worsening abdominal pain and tachycardia.  Venous lactate was collected and found to be elevated at 8.  She was transferred to the ICU. NGT was placed with 3 L of feculent material obtained shortly after placement.  Her pain and nausea dramatically improved afterwards.   INTERVAL HISTORY: mouth dry Denies cp, palpitations,  Abd pain improved  VITAL SIGNS: Temp:  [97.4 F (36.3 C)-98 F (36.7 C)] 97.5 F (36.4 C) (01/18 0400) Pulse Rate:  [87-158] 140  (01/18 1000) Resp:  [17-56] 31  (01/18 1000) BP: (102-162)/(52-102) 129/66 mmHg (01/18 1000) SpO2:  [94 %-100 %] 97 % (01/18 1000) Weight:  [108.8 kg (239 lb 13.8 oz)] 108.8 kg (239 lb 13.8 oz) (01/17 1648) HEMODYNAMICS: CVP:  [2 mmHg-4 mmHg] 4 mmHg VENTILATOR SETTINGS: N/A   INTAKE / OUTPUT: Intake/Output      01/17 0701 - 01/18 0700 01/18 0701 - 01/19 0700   I.V. (mL/kg) 100 (0.9)    NG/GT 3550    IV Piggyback 112.5 512.5   Total Intake(mL/kg) 3762.5 (34.6) 512.5 (4.7)   Urine (mL/kg/hr) 300 (0.1)    Total Output 300    Net +3462.5 +512.5          PHYSICAL  EXAMINATION: General:  Uncomfortable, diaphoretic Neuro:  Drowsy but arousable, moves all 4 extremities HEENT:  Sclera clear, MMM, PERRL Cardiovascular:  Tachy, regular Lungs:  CTAB Abdomen:  Distended, no bowel sounds, diffuse tenderness without rebound Musculoskeletal:  Joints wnl Skin:  No rash   LABS: Cbc  Lab 06/02/12 0550 06/01/12 1723 06/01/12 1119  WBC 17.6* -- --  HGB 14.0 15.7* 14.0  HCT 41.9 47.5* 42.0  PLT 413* 532* 444*    Chemistry   Lab 06/02/12 0550 06/01/12 2155 06/01/12 1723 06/01/12 1255  NA 142 138 -- 140  K 3.5 3.4* -- 3.7  CL 109 100 -- 102  CO2 19 18* -- 25  BUN 24* 17 -- 12  CREATININE 1.01 0.79 0.61 --  CALCIUM 8.3* 10.0 -- 9.8  MG -- -- -- --  PHOS -- -- -- --  GLUCOSE 146* 203* -- 144*    Liver fxn  Lab 06/02/12 0550 06/01/12 1255  AST 15 12  ALT 10 8  ALKPHOS 64 91  BILITOT 0.6 0.3  PROT 5.9* 8.0  ALBUMIN 2.4* 3.5   coags  Lab 06/02/12 0550  APTT 35  INR 1.43   Sepsis markers  Lab 06/02/12 0555 06/01/12 2155  LATICACIDVEN 1.7 8.1*  PROCALCITON -- --   Cardiac markers No results found for this basename: CKTOTAL:3,CKMB:3,TROPONINI:3 in the last 168 hours BNP No results found for this  basename: PROBNP:3 in the last 168 hours ABG  Lab 06/02/12 0600 06/02/12 0245  PHART 7.405 7.455*  PCO2ART 32.4* 24.6*  PO2ART 64.0* 87.0  HCO3 19.9* 17.1*  TCO2 20.9 17.8    CBG trend No results found for this basename: GLUCAP:5 in the last 168 hours  IMAGING:  ECG: SVT  DIAGNOSES: Principal Problem:  *Abdominal pain Active Problems:  Nausea & vomiting  Body aches  Flu-like symptoms  Ileus  HTN (hypertension)  Adrenal nodule   ASSESSMENT / PLAN:  PULMONARY  ASSESSMENT: Tachypea likely 2/2 compensation from lactate and/or abdominal pain. Oxygenating well. PLAN:   - Monitor for signs of fatigue. Appears much more comfortable after NGT decompression  CARDIOVASCULAR  ASSESSMENT: Tachycardia, regular.  Suspect 2/2  underlying abdominal process and hypovolemia. BP stable PLAN:  -rate control with lopressor prn, can resume home cardizem once able to take PO - Lactate resolved with fluids, ct iVFs for low CVP -goal 8   RENAL  ASSESSMENT:  Stable Cr. Mixed metabolic alkalosis and AG metabolic acidosis.  Suspect from vomiting + lacate PLAN:   Replete lytes prn  GASTROINTESTINAL  ASSESSMENT:  Suspect ileus based on CT scan and copious NGT output.  ? Infectious vs ischemic, loculated fluid along RT paracolic ? etio PLAN:   - Evaluated by surgery - Continue NGT to LIS - NPO -  Low threshold to repeat CT  HEMATOLOGIC  ASSESSMENT: Stable PLAN:  - Monitor CBC  INFECTIOUS  ASSESSMENT:  No obvious infection, but concern for intra-abdominal process given lactate. PLAN:   - Start Zosyn - Ordered blood cultures - influenza neg   NEUROLOGIC  ASSESSMENT:  Abdominal pain PLAN:   - Minimize pain medication to follow abdominal exam.  CLINICAL SUMMARY:   I have personally obtained a history, examined the patient, evaluated laboratory and imaging results, formulated the assessment and plan and placed orders.  PCCM to sign off Can transfer to SDU once HR controlled  ALVA,RAKESH V.   06/02/2012, 11:21 AM

## 2012-06-02 NOTE — Progress Notes (Signed)
Placed pt on telemetry per new order. HR in 150's and sustaining. Will notify on-call hospitalist.

## 2012-06-02 NOTE — Progress Notes (Signed)
Pt's HR remains elevated after receiving metoprolol 5mg . Lactic acid noted to be very elevated 8.1  and WBC up to 33. Concern for sepsis or underlying abdominal process (? Mesenteric ischemia or perforation). Discussed with PCCM, will transfer to ICU and have surgery evaluate tonight.

## 2012-06-02 NOTE — Consult Note (Signed)
PULMONARY  / CRITICAL CARE MEDICINE  Name: Kimberly Sellers MRN: [id number] DOB: July 22, 1945    LOS: 1  REFERRING MD :  Kimberly Sellers Hospitalist  CHIEF COMPLAINT:  Nausea, vomiting  BRIEF PATIENT DESCRIPTION: 67 yo F with HTN with nausea, vomiting, diffuse abdominal pain transferred [facility] ICU for increasing tachycardia, tachypnea and lactic acidosis.  LINES / TUBES: R IJ 06/02/12 >>>  CULTURES: BLood cx ordered 1/18  ANTIBIOTICS: Zosyn 1/18 >>>  SIGNIFICANT EVENTS:  1/18 transferred [facility] ICU  LEVEL OF CARE:  ICCU PRIMARY SERVICE:  PCCM CONSULTANTS:  Surgery CODE STATUS Full Code DIET:  NPO DVT Px:  SQ Heparin GI Px:  PPI  HISTORY OF PRESENT ILLNESS:  67 yo F with HTN who was admitted yesterday with nausea, vomiting and diffuse abdominal pain.  CT scan was obtained showing possible ileus but no other pathology. Overnight she developed worsening abdominal pain and tachycardia.  Venous lactate was collected and found Sellers be elevated at 8.  She was transferred [facility] the ICU. NGT was placed with 3 L of feculent material obtained shortly after placement.  Her pain and nausea dramatically improved afterwards.  PAST MEDICAL HISTORY :  Past Medical History  Diagnosis Date  . Hypertension   . Anxiety    Past Surgical History  Procedure Date  . Cholecystectomy    Prior [facility] Admission medications   Medication Sig Start Date End Date Taking? Authorizing Provider  Ascorbic Acid (VITAMIN C PO) Take 1 tablet by mouth daily.   Yes Historical Provider, MD  aspirin 81 MG tablet Take 81 mg by mouth daily.   Yes Historical Provider, MD  Cholecalciferol (VITAMIN D PO) Take 1 tablet by mouth daily.   Yes Historical Provider, MD  diltiazem (DILACOR XR) 240 MG 24 hr capsule Take 240 mg by mouth daily.   Yes Historical Provider, MD  lisinopril-hydrochlorothiazide (PRINZIDE,ZESTORETIC) 20-25 MG per tablet Take 1 tablet by mouth daily.   Yes Historical Provider, MD  omeprazole (PRILOSEC) 20 MG capsule  Take 20 mg by mouth 2 (two) times daily.   Yes Historical Provider, MD   No Known Allergies  FAMILY HISTORY:  History reviewed. No pertinent family history. SOCIAL HISTORY:  reports that she has never smoked. She does not have any smokeless tobacco history on file. She reports that she does not drink alcohol or use illicit drugs.  REVIEW OF SYSTEMS:  12 point ROS negative except as listed in HPI   INTERVAL HISTORY:   VITAL SIGNS: Temp:  [97.4 F (36.3 C)-98 F (36.7 C)] 97.5 F (36.4 C) (01/18 0400) Pulse Rate:  [107-158] 153  (01/18 0345) Resp:  [17-56] 50  (01/18 0400) BP: (102-162)/(52-102) 117/75 mmHg (01/18 0400) SpO2:  [94 %-100 %] 100 % (01/18 0400) Weight:  [108.8 kg (239 lb 13.8 oz)] 108.8 kg (239 lb 13.8 oz) (01/17 1648) HEMODYNAMICS:   VENTILATOR SETTINGS: N/A   INTAKE / OUTPUT: Intake/Output    None     PHYSICAL EXAMINATION: General:  Uncomfortable, diaphoretic Neuro:  Drowsy but arousable, moves all 4 extremities HEENT:  Sclera clear, MMM, PERRL Cardiovascular:  Tachy, regular Lungs:  CTAB Abdomen:  Distended, no bowel sounds, diffuse tenderness without rebound Musculoskeletal:  Joints wnl Skin:  No rash   LABS: Cbc  Lab 06/01/12 1723 06/01/12 1119  WBC 33.8* --  HGB 15.7* 14.0  HCT 47.5* 42.0  PLT 532* 444*    Chemistry   Lab 06/01/12 2155 06/01/12 1723 06/01/12 1255  NA 138 -- 140  K  3.4* -- 3.7  CL 100 -- 102  CO2 18* -- 25  BUN 17 -- 12  CREATININE 0.79 0.61 0.53  CALCIUM 10.0 -- 9.8  MG -- -- --  PHOS -- -- --  GLUCOSE 203* -- 144*    Liver fxn  Lab 06/01/12 1255  AST 12  ALT 8  ALKPHOS 91  BILITOT 0.3  PROT 8.0  ALBUMIN 3.5   coags No results found for this basename: APTT:3,INR:3 in the last 168 hours Sepsis markers  Lab 06/01/12 2155  LATICACIDVEN 8.1*  PROCALCITON --   Cardiac markers No results found for this basename: CKTOTAL:3,CKMB:3,TROPONINI:3 in the last 168 hours BNP No results found for this  basename: PROBNP:3 in the last 168 hours ABG  Lab 06/02/12 0245  PHART 7.455*  PCO2ART 24.6*  PO2ART 87.0  HCO3 17.1*  TCO2 17.8    CBG trend No results found for this basename: GLUCAP:5 in the last 168 hours  IMAGING:  ECG:  DIAGNOSES: Principal Problem:  *Abdominal pain Active Problems:  Nausea & vomiting  Body aches  Flu-like symptoms  Ileus  HTN (hypertension)  Adrenal nodule   ASSESSMENT / PLAN:  PULMONARY  ASSESSMENT: Tachypea likely 2/2 compensation from lactate and/or abdominal pain. Oxygenating well. PLAN:   - Monitor for signs of fatigue. Appears much more comfortable after NGT decompression  CARDIOVASCULAR  ASSESSMENT: Tachycardia, regular.  Suspect 2/2 underlying abdominal process and hypovolemia. BP stable PLAN:  - Hold rate control - 2 L NS - Place central line and continue IVF after evaluating CVP  RENAL  ASSESSMENT:  Stable Cr. Mixed metabolic alkalosis and AG metabolic acidosis.  Suspect from vomiting + lacate PLAN:   Replete lytes prn  GASTROINTESTINAL  ASSESSMENT:  Suspect ileus based on CT scan and copious NGT output.   PLAN:   - Evaluated by surgery - Continue NGT - NPO - Follow lactate for resolution after fluid resuscitation and NGT decompression - Portable abdominal XR series Sellers r/o development of free air with clinical decompensation.  Low threshold Sellers repeat CT  HEMATOLOGIC  ASSESSMENT: Stable PLAN:  - Monitor CBC  INFECTIOUS  ASSESSMENT:  No obvious infection, but concern for intra-abdominal process given lactate. PLAN:   - Start Zosyn - Ordered blood cultures - Check influenza   NEUROLOGIC  ASSESSMENT:  Abdominal pain PLAN:   - Minimize pain medication Sellers follow abdominal exam.  CLINICAL SUMMARY:   I have personally obtained a history, examined the patient, evaluated laboratory and imaging results, formulated the assessment and plan and placed orders.  CRITICAL CARE: The patient is critically ill with  multiple organ systems failure and requires high complexity decision making for assessment and support, frequent evaluation and titration of therapies, application of advanced monitoring technologies and extensive interpretation of multiple databases. Critical Care Time devoted [facility] patient care services described in this note is 55 minutes.   Kimberly Tooley M.D. Pulmonary and Critical Care Medicine [facility] Pager: [phone]  06/02/2012, 4:19 AM

## 2012-06-02 NOTE — Consult Note (Signed)
Reason for Consult: abdominal pain, elev lactate Referring Physician: Georgia Dom, PA  Kimberly Sellers is an 67 y.o. female.  HPI: Pt currently getting a CVL and most of the h/o obtained from chart review. The patient was admitted last night after a 24hr h/o n/v/d while at home.  Pt underwent CT of her and secondary to gen abd pain.  CT revealed signs c/w ileus with fluid filled loops of bowel and a tiny amount of free fluid in the mesentery.  The patient has a hx of HTN and was Cardizem at home, no h/o cardiac arrythmias.  Pt had been tachycardic since arrival and recently un SVT with HR in the 150's.  The patient had another set of labs to which revealed an elevated WBC at 33 and a lactate of 8.1.  Since arrival the patient had a 1L bolus of IVF with minimal response in HR. No Foley to record accurate I/Os.  Past Medical History  Diagnosis Date  . Hypertension   . Anxiety     Past Surgical History  Procedure Date  . Cholecystectomy     History reviewed. No pertinent family history.  Social History:  reports that she has never smoked. She does not have any smokeless tobacco history on file. She reports that she does not drink alcohol or use illicit drugs.  Allergies: No Known Allergies  Medications: I have reviewed the patient's current medications.  Results for orders placed during the hospital encounter of 06/01/12 (from the past 48 hour(s))  CBC WITH DIFFERENTIAL     Status: Abnormal   Collection Time   06/01/12 11:19 AM      Component Value Range Comment   WBC 19.9 (*) 4.0 - 10.5 K/uL    RBC 5.66 (*) 3.87 - 5.11 MIL/uL    Hemoglobin 14.0  12.0 - 15.0 g/dL    HCT 16.1  09.6 - 04.5 %    MCV 74.2 (*) 78.0 - 100.0 fL    MCH 24.7 (*) 26.0 - 34.0 pg    MCHC 33.3  30.0 - 36.0 g/dL    RDW 40.9 (*) 81.1 - 15.5 %    Platelets 444 (*) 150 - 400 K/uL    Neutrophils Relative 90 (*) 43 - 77 %    Neutro Abs 18.0 (*) 1.7 - 7.7 K/uL    Lymphocytes Relative 7 (*) 12 - 46 %    Lymphs Abs 1.3   0.7 - 4.0 K/uL    Monocytes Relative 3  3 - 12 %    Monocytes Absolute 0.5  0.1 - 1.0 K/uL    Eosinophils Relative 0  0 - 5 %    Eosinophils Absolute 0.0  0.0 - 0.7 K/uL    Basophils Relative 0  0 - 1 %    Basophils Absolute 0.0  0.0 - 0.1 K/uL   LIPASE, BLOOD     Status: Normal   Collection Time   06/01/12 12:55 PM      Component Value Range Comment   Lipase 20  11 - 59 U/L   COMPREHENSIVE METABOLIC PANEL     Status: Abnormal   Collection Time   06/01/12 12:55 PM      Component Value Range Comment   Sodium 140  135 - 145 mEq/L    Potassium 3.7  3.5 - 5.1 mEq/L    Chloride 102  96 - 112 mEq/L    CO2 25  19 - 32 mEq/L    Glucose, Bld 144 (*) 70 -  99 mg/dL    BUN 12  6 - 23 mg/dL    Creatinine, Ser 1.61  0.50 - 1.10 mg/dL    Calcium 9.8  8.4 - 09.6 mg/dL    Total Protein 8.0  6.0 - 8.3 g/dL    Albumin 3.5  3.5 - 5.2 g/dL    AST 12  0 - 37 U/L    ALT 8  0 - 35 U/L    Alkaline Phosphatase 91  39 - 117 U/L    Total Bilirubin 0.3  0.3 - 1.2 mg/dL    GFR calc non Af Amer >90  >90 mL/min    GFR calc Af Amer >90  >90 mL/min   OCCULT BLOOD, POC DEVICE     Status: Normal   Collection Time   06/01/12  3:02 PM      Component Value Range Comment   Fecal Occult Bld NEGATIVE  NEGATIVE   URINALYSIS, ROUTINE W REFLEX MICROSCOPIC     Status: Abnormal   Collection Time   06/01/12  3:07 PM      Component Value Range Comment   Color, Urine YELLOW  YELLOW    APPearance CLEAR  CLEAR    Specific Gravity, Urine 1.015  1.005 - 1.030    pH 6.0  5.0 - 8.0    Glucose, UA NEGATIVE  NEGATIVE mg/dL    Hgb urine dipstick NEGATIVE  NEGATIVE    Bilirubin Urine NEGATIVE  NEGATIVE    Ketones, ur 15 (*) NEGATIVE mg/dL    Protein, ur NEGATIVE  NEGATIVE mg/dL    Urobilinogen, UA 1.0  0.0 - 1.0 mg/dL    Nitrite NEGATIVE  NEGATIVE    Leukocytes, UA NEGATIVE  NEGATIVE MICROSCOPIC NOT DONE ON URINES WITH NEGATIVE PROTEIN, BLOOD, LEUKOCYTES, NITRITE, OR GLUCOSE <1000 mg/dL.  CBC     Status: Abnormal   Collection  Time   06/01/12  5:23 PM      Component Value Range Comment   WBC 33.8 (*) 4.0 - 10.5 K/uL    RBC 6.28 (*) 3.87 - 5.11 MIL/uL    Hemoglobin 15.7 (*) 12.0 - 15.0 g/dL    HCT 04.5 (*) 40.9 - 46.0 %    MCV 75.6 (*) 78.0 - 100.0 fL    MCH 25.0 (*) 26.0 - 34.0 pg    MCHC 33.1  30.0 - 36.0 g/dL    RDW 81.1 (*) 91.4 - 15.5 %    Platelets 532 (*) 150 - 400 K/uL   CREATININE, SERUM     Status: Normal   Collection Time   06/01/12  5:23 PM      Component Value Range Comment   Creatinine, Ser 0.61  0.50 - 1.10 mg/dL    GFR calc non Af Amer >90  >90 mL/min    GFR calc Af Amer >90  >90 mL/min   LACTIC ACID, PLASMA     Status: Abnormal   Collection Time   06/01/12  9:55 PM      Component Value Range Comment   Lactic Acid, Venous 8.1 (*) 0.5 - 2.2 mmol/L   BASIC METABOLIC PANEL     Status: Abnormal   Collection Time   06/01/12  9:55 PM      Component Value Range Comment   Sodium 138  135 - 145 mEq/L    Potassium 3.4 (*) 3.5 - 5.1 mEq/L    Chloride 100  96 - 112 mEq/L    CO2 18 (*) 19 - 32 mEq/L  Glucose, Bld 203 (*) 70 - 99 mg/dL    BUN 17  6 - 23 mg/dL    Creatinine, Ser 8.11  0.50 - 1.10 mg/dL    Calcium 91.4  8.4 - 10.5 mg/dL    GFR calc non Af Amer 84 (*) >90 mL/min    GFR calc Af Amer >90  >90 mL/min   BLOOD GAS, ARTERIAL     Status: Abnormal   Collection Time   06/02/12  2:45 AM      Component Value Range Comment   FIO2 0.28      Delivery systems NASAL CANNULA      pH, Arterial 7.455 (*) 7.350 - 7.450    pCO2 arterial 24.6 (*) 35.0 - 45.0 mmHg    pO2, Arterial 87.0  80.0 - 100.0 mmHg    Bicarbonate 17.1 (*) 20.0 - 24.0 mEq/L    TCO2 17.8  0 - 100 mmol/L    Acid-base deficit 6.1 (*) 0.0 - 2.0 mmol/L    O2 Saturation 96.7      Patient temperature 98.6      Collection site RIGHT RADIAL      Drawn by COLLECTED BY RT      Sample type ARTERIAL DRAW      Allens test (pass/fail) PASS  PASS     Dg Chest 2 View  06/01/2012  *RADIOLOGY REPORT*  Clinical Data: Flu-like symptoms   CHEST - 2 VIEW  Comparison: 05/24/2009  Findings: Normal heart size.  No pleural effusion or edema.  The lung volumes are low.  Asymmetric elevation of the left hemidiaphragm is noted.  Scarring noted in the left lower lobe.  IMPRESSION:  1.  Low lung volumes with asymmetric elevation of the right hemidiaphragm.   Original Report Authenticated By: Signa Kell, M.D.    Ct Abdomen Pelvis W Contrast  06/01/2012  *RADIOLOGY REPORT*  Clinical Data: Nausea, vomiting, diarrhea, 52  CT ABDOMEN AND PELVIS WITH CONTRAST  Technique:  Multidetector CT imaging of the abdomen and pelvis was performed following the standard protocol during bolus administration of intravenous contrast.  Contrast: OMNIPAQUE IOHEXOL 300 MG/ML  SOLN  Comparison: None.  Findings: Post cholecystectomy.  Liver, pancreas, spleen, right adrenal gland are within normal limits.  1.3 cm left adrenal nodule is indeterminate.  Benign-appearing cyst in the left kidney.  Indeterminate hypodensities in the right kidney.  There is a central hypodensity in the right kidney on image 36.  Nondistended air and fluid-filled small bowel loops are scattered across abdomen with air-fluid levels.  No transition point to suggest obstruction.  Diverticulosis of the descending and sigmoid colon is present without definite evidence of acute diverticulitis.  Small amount of free fluid is seen within the mesentery on images 62-66.  There is also fluid extending from the tip of the liver and along the right paracolic gutter with an unusual and lobulated appearance.  Loculated fluid is suggestive of unknown significance.  Advanced degenerative disc disease with facet arthropathy is present in the lumbar spine.  An element of spinal stenosis occurs at L4-5.  Atherosclerotic changes of the aorta, mid SMA, and iliac arteries are noted.  No obvious focal significant stenosis.  Unremarkable bladder.  Uterus absent.  No obvious ovarian mass.  IMPRESSION: Small bowel air fluid  levels in an ileus pattern and no definite transition point.  Small amount of free fluid in the mesentery suggesting an inflammatory process.  Nonspecific 1.3 cm left adrenal nodule.  If there is a history  of malignancy or strong concern for malignancy, MRI can be performed to further characterize.  Loculated peritoneal fluid along the right paracolic gutter of unknown significance.  No obvious omental caking or enhancing peritoneal mass.  Degenerative changes in the lumbar spine.   Original Report Authenticated By: Jolaine Click, M.D.    Dg Abd Portable 1v  06/02/2012  *RADIOLOGY REPORT*  Clinical Data: Concern for perforated bowel.  Abdominal pain.  PORTABLE ABDOMEN - 1 VIEW  Comparison: CT abdomen and pelvis 06/01/2012.  The  Findings: Limited upright portable view of the abdomen demonstrates no evidence of free intra-abdominal air.  The lower abdomen is not well included and bowel gas pattern cannot be commented upon. The study is technically limited due to motion artifact.  IMPRESSION: No free air demonstrated on limited upright view.   Original Report Authenticated By: Burman Nieves, M.D.     Review of Systems  Unable to perform ROS Constitutional: Positive for fever, chills and malaise/fatigue.  Gastrointestinal: Positive for nausea, vomiting, abdominal pain and diarrhea.   Blood pressure 106/52, pulse 153, temperature 97.8 F (36.6 C), temperature source Oral, resp. rate 48, height 5\' 5"  (1.651 m), weight 239 lb 13.8 oz (108.8 kg), SpO2 100.00%. Physical Exam  Constitutional: She appears well-developed and well-nourished.  HENT:  Head: Normocephalic and atraumatic.  Eyes: Conjunctivae normal and EOM are normal. Pupils are equal, round, and reactive to light.  Neck: Normal range of motion. Neck supple.  Cardiovascular: Tachycardia present.   Respiratory: Effort normal.  GI: Soft. She exhibits no distension. There is tenderness (diffuselty in all 4Q's). There is no rebound and no guarding.   Musculoskeletal: Normal range of motion.  Neurological: She is alert.    Assessment/Plan: 67 y/o F with possible influenza and/or gastroenteritis.    Pt was just transferred to CICU for further monitoring.  Influenza test currently pending.  I would recommend the patient be resuscitated initially with 3-4L IVF which may help with her tachycardia.  Pt also needs a foley to closer monitor her UOP and guide resuscitation.  We will con't to monitor her abd exam and lab studies.  KUB shows no free air, and CT shows no occlusion of her mesenteric vessels.  At this time I do not see any emergent reason to take her to the OR for any intervention.    Marigene Ehlers., Janaia Kozel 06/02/2012, 3:51 AM

## 2012-06-02 NOTE — Progress Notes (Addendum)
INITIAL NUTRITION ASSESSMENT  DOCUMENTATION CODES Per approved criteria  -Obesity Unspecified   INTERVENTION: 1. Advance diet as tolerated per MD.  2. If unable to advance diet, consider initiating TPN per pharmacy.  3. RD will monitor per nutrition plan of care.   NUTRITION DIAGNOSIS: Inadequate oral intake related to ileus as evidenced by NPO status.   Goal: Patient will tolerate diet advancement to meet >/=90% of estimated nutrition needs.   Monitor:  Diet advancement and intake, weight, labs, I/Os  Reason for Assessment: Malnutrition screening tool, score of 3.   67 y.o. female  Admitting Dx: Abdominal pain  ASSESSMENT: Patient admitted with abdominal pain, nausea, and vomiting. CT scan showed ileus. She is currently NPO on bowel rest with NG tube (3 L output).   Height: Ht Readings from Last 1 Encounters:  06/01/12 5\' 5"  (1.651 m)    Weight: Wt Readings from Last 1 Encounters:  06/01/12 239 lb 13.8 oz (108.8 kg)    Ideal Body Weight: 56.8 kg  % Ideal Body Weight: 192%  Wt Readings from Last 10 Encounters:  06/01/12 239 lb 13.8 oz (108.8 kg)    Usual Body Weight: Unknown   % Usual Body Weight:   BMI:  Body mass index is 39.91 kg/(m^2). Patient is obese.  Estimated Nutritional Needs: Kcal: 1950-2100 kcal Protein: 110-120 g Fluid: 1.9-2.0 L  Skin: Intact   Diet Order: NPO  EDUCATION NEEDS: -No education needs identified at this time   Intake/Output Summary (Last 24 hours) at 06/02/12 1035 Last data filed at 06/02/12 0808  Gross per 24 hour  Intake   4275 ml  Output    300 ml  Net   3975 ml    Last BM: PTA   Labs:   Lab 06/02/12 0550 06/01/12 2155 06/01/12 1723 06/01/12 1255  NA 142 138 -- 140  K 3.5 3.4* -- 3.7  CL 109 100 -- 102  CO2 19 18* -- 25  BUN 24* 17 -- 12  CREATININE 1.01 0.79 0.61 --  CALCIUM 8.3* 10.0 -- 9.8  MG -- -- -- --  PHOS -- -- -- --  GLUCOSE 146* 203* -- 144*    CBG (last 3)  No results found for this  basename: GLUCAP:3 in the last 72 hours  Scheduled Meds:   . aspirin EC  81 mg Oral Daily  . heparin subcutaneous  5,000 Units Subcutaneous Q8H  . metronidazole  500 mg Intravenous Q8H  . pantoprazole (PROTONIX) IV  40 mg Intravenous Q24H  . piperacillin-tazobactam (ZOSYN)  IV  3.375 g Intravenous Q8H  . pneumococcal 23 valent vaccine  0.5 mL Intramuscular Tomorrow-1000  . vancomycin  1,000 mg Intravenous Q12H    Continuous Infusions:   . sodium chloride 100 mL/hr at 06/02/12 1610    Past Medical History  Diagnosis Date  . Hypertension   . Anxiety     Past Surgical History  Procedure Date  . Cholecystectomy     Linnell Fulling, RD, LDN Pager #: [phone] After-Hours Pager #: [phone]

## 2012-06-03 ENCOUNTER — Observation Stay (HOSPITAL_COMMUNITY): Payer: Medicare Other

## 2012-06-03 DIAGNOSIS — I4891 Unspecified atrial fibrillation: Secondary | ICD-10-CM

## 2012-06-03 DIAGNOSIS — A419 Sepsis, unspecified organism: Secondary | ICD-10-CM | POA: Diagnosis present

## 2012-06-03 DIAGNOSIS — R112 Nausea with vomiting, unspecified: Secondary | ICD-10-CM

## 2012-06-03 LAB — BASIC METABOLIC PANEL
BUN: 21 mg/dL (ref 6–23)
Calcium: 8.3 mg/dL — ABNORMAL LOW (ref 8.4–10.5)
Creatinine, Ser: 0.71 mg/dL (ref 0.50–1.10)
GFR calc Af Amer: >90 mL/min (ref >90)
GFR calc non Af Amer: 87 mL/min — ABNORMAL LOW (ref >90)
Potassium: 3 mEq/L — ABNORMAL LOW (ref 3.5–5.1)

## 2012-06-03 LAB — HEPATIC FUNCTION PANEL
ALT: 8 U/L (ref 0–35)
AST: 13 U/L (ref 0–37)
Alkaline Phosphatase: 56 U/L (ref 39–117)
Bilirubin, Direct: 0.1 mg/dL (ref 0.0–0.3)
Total Bilirubin: 0.3 mg/dL (ref 0.3–1.2)

## 2012-06-03 LAB — CBC
HCT: 38.1 % (ref 36.0–46.0)
MCHC: 32.8 g/dL (ref 30.0–36.0)
RDW: 17.3 % — ABNORMAL HIGH (ref 11.5–15.5)

## 2012-06-03 LAB — PHOSPHORUS: Phosphorus: 1.6 mg/dL — ABNORMAL LOW (ref 2.3–4.6)

## 2012-06-03 MED ORDER — DILTIAZEM HCL 60 MG PO TABS
60.0000 mg | ORAL_TABLET | Freq: Four times a day (QID) | ORAL | Status: AC
  Administered 2012-06-03 – 2012-06-07 (×13): 60 mg via ORAL
  Filled 2012-06-03 (×15): qty 1

## 2012-06-03 MED ORDER — METOPROLOL TARTRATE 1 MG/ML IV SOLN
5.0000 mg | INTRAVENOUS | Status: AC
  Administered 2012-06-03: 5 mg via INTRAVENOUS

## 2012-06-03 MED ORDER — DILTIAZEM HCL 100 MG IV SOLR
5.0000 mg/h | INTRAVENOUS | Status: DC
  Administered 2012-06-03: 15 mg/h via INTRAVENOUS
  Filled 2012-06-03 (×2): qty 100

## 2012-06-03 MED ORDER — POTASSIUM CHLORIDE 10 MEQ/50ML IV SOLN
10.0000 meq | INTRAVENOUS | Status: AC
  Administered 2012-06-03 (×4): 10 meq via INTRAVENOUS

## 2012-06-03 MED ORDER — ALPRAZOLAM 0.25 MG PO TABS
0.2500 mg | ORAL_TABLET | Freq: Every day | ORAL | Status: DC | PRN
  Administered 2012-06-03 – 2012-06-10 (×9): 0.25 mg via ORAL
  Filled 2012-06-03 (×9): qty 1

## 2012-06-03 MED ORDER — SODIUM CHLORIDE 0.9 % IJ SOLN
10.0000 mL | INTRAMUSCULAR | Status: DC | PRN
  Administered 2012-06-03 – 2012-06-06 (×6): 10 mL

## 2012-06-03 MED ORDER — POTASSIUM CHLORIDE 10 MEQ/50ML IV SOLN
INTRAVENOUS | Status: AC
  Administered 2012-06-03: 10 meq
  Filled 2012-06-03: qty 50

## 2012-06-03 NOTE — Progress Notes (Signed)
Utilization review completed.  

## 2012-06-03 NOTE — Progress Notes (Addendum)
Subjective: Pt doing well.  Feels much better from her abd perspective.  No nausea since MN and NGT clamped.  Diarrhea continues.  Cdiff --.  Objective: Vital signs in last 24 hours: Temp:  [97.5 F (36.4 C)-98.4 F (36.9 C)] 98.4 F (36.9 C) (01/19 0000) Pulse Rate:  [105-149] 105  (01/19 0700) Resp:  [14-42] 22  (01/19 0700) BP: (82-141)/(48-100) 108/60 mmHg (01/19 0700) SpO2:  [94 %-100 %] 100 % (01/19 0700) Last BM Date: 06/02/12  Intake/Output from previous day: 01/18 0701 - 01/19 0700 In: 4995 [P.O.:480; I.V.:3377.5; IV Piggyback:1137.5] Out: 1925 [Urine:1175; Emesis/NG output:750] Intake/Output this shift:    General appearance: alert and cooperative GI: soft, non-tender; bowel sounds normal; no masses,  no organomegaly  Lab Results:   [facility] 06/02/12 0550 06/01/12 1723  WBC 17.6* 33.8*  HGB 14.0 15.7*  HCT 41.9 47.5*  PLT 413* 532*   BMET  Basename 06/03/12 0547 06/02/12 0550  NA 144 142  K 3.0* 3.5  CL 111 109  CO2 21 19  GLUCOSE 156* 146*  BUN 21 24*  CREATININE 0.71 1.01  CALCIUM 8.3* 8.3*   PT/INR  Basename 06/02/12 0550  LABPROT 17.1*  INR 1.43   ABG  Basename 06/02/12 0600 06/02/12 0245  PHART 7.405 7.455*  HCO3 19.9* 17.1*    Studies/Results: Dg Chest 2 View  06/01/2012  *RADIOLOGY REPORT*  Clinical Data: Flu-like symptoms  CHEST - 2 VIEW  Comparison: 05/24/2009  Findings: Normal heart size.  No pleural effusion or edema.  The lung volumes are low.  Asymmetric elevation of the left hemidiaphragm is noted.  Scarring noted in the left lower lobe.  IMPRESSION:  1.  Low lung volumes with asymmetric elevation of the right hemidiaphragm.   Original Report Authenticated By: Signa Kell, M.D.    Ct Abdomen Pelvis W Contrast  06/01/2012  *RADIOLOGY REPORT*  Clinical Data: Nausea, vomiting, diarrhea, 52  CT ABDOMEN AND PELVIS WITH CONTRAST  Technique:  Multidetector CT imaging of the abdomen and pelvis was performed following the standard  protocol during bolus administration of intravenous contrast.  Contrast: OMNIPAQUE IOHEXOL 300 MG/ML  SOLN  Comparison: None.  Findings: Post cholecystectomy.  Liver, pancreas, spleen, right adrenal gland are within normal limits.  1.3 cm left adrenal nodule is indeterminate.  Benign-appearing cyst in the left kidney.  Indeterminate hypodensities in the right kidney.  There is a central hypodensity in the right kidney on image 36.  Nondistended air and fluid-filled small bowel loops are scattered across abdomen with air-fluid levels.  No transition point to suggest obstruction.  Diverticulosis of the descending and sigmoid colon is present without definite evidence of acute diverticulitis.  Small amount of free fluid is seen within the mesentery on images 62-66.  There is also fluid extending from the tip of the liver and along the right paracolic gutter with an unusual and lobulated appearance.  Loculated fluid is suggestive of unknown significance.  Advanced degenerative disc disease with facet arthropathy is present in the lumbar spine.  An element of spinal stenosis occurs at L4-5.  Atherosclerotic changes of the aorta, mid SMA, and iliac arteries are noted.  No obvious focal significant stenosis.  Unremarkable bladder.  Uterus absent.  No obvious ovarian mass.  IMPRESSION: Small bowel air fluid levels in an ileus pattern and no definite transition point.  Small amount of free fluid in the mesentery suggesting an inflammatory process.  Nonspecific 1.3 cm left adrenal nodule.  If there is a history of  malignancy or strong concern for malignancy, MRI can be performed to further characterize.  Loculated peritoneal fluid along the right paracolic gutter of unknown significance.  No obvious omental caking or enhancing peritoneal mass.  Degenerative changes in the lumbar spine.   Original Report Authenticated By: Jolaine Click, M.D.    Dg Chest Port 1 View  06/02/2012  *RADIOLOGY REPORT*  Clinical Data:  Placement.  PORTABLE CHEST - 1 VIEW  Comparison: 06/02/2012 at 0435 hours  Findings: Shallow inspiration.  Cardiac enlargement with normal pulmonary vascularity given the technique.  Enteric tube remains unchanged in position in the upper stomach.  Interval placement of a right central venous catheter with tip overlying the cavoatrial junction.  No pneumothorax.  No developing consolidation in the lungs.  Probable atelectasis in the lung bases.  IMPRESSION: Interval placement of right central venous catheter with tip over the cavoatrial junction.   Original Report Authenticated By: Burman Nieves, M.D.    Dg Chest Port 1 View  06/02/2012  *RADIOLOGY REPORT*  Clinical Data: Abdominal pain.  Nausea and vomiting.  PORTABLE CHEST - 1 VIEW  Comparison: 06/01/2012  Findings: Shallow inspiration.  Mild cardiac enlargement with normal pulmonary vascularity.  Linear fibrosis in the left mid lung.  No focal airspace consolidation.  No pneumothorax.  No free intra-abdominal air is noted.  Since the previous study, an enteric tube has been placed with tip below the left hemidiaphragm consistent with location in the lower stomach.  Otherwise no significant change.  IMPRESSION: Shallow inspiration with mild cardiac enlargement.  No active consolidation.   Original Report Authenticated By: Burman Nieves, M.D.    Dg Abd Portable 1v  06/02/2012  *RADIOLOGY REPORT*  Clinical Data: Concern for perforated bowel.  Abdominal pain.  PORTABLE ABDOMEN - 1 VIEW  Comparison: CT abdomen and pelvis 06/01/2012.  The  Findings: Limited upright portable view of the abdomen demonstrates no evidence of free intra-abdominal air.  The lower abdomen is not well included and bowel gas pattern cannot be commented upon. The study is technically limited due to motion artifact.  IMPRESSION: No free air demonstrated on limited upright view.   Original Report Authenticated By: Burman Nieves, M.D.    Dg Abd Portable 2v  06/02/2012  *RADIOLOGY  REPORT*  Clinical Data: Abdominal pain.  Nausea and vomiting.  PORTABLE ABDOMEN - 2 VIEW  Comparison: 06/02/2012 at 0253 hours.  Findings: Scattered gas and stool throughout the colon.  No small or large bowel distension.  No free intra-abdominal air.  There are small air-fluid levels suggesting strain of [place] which can be associated with dilated fluid-filled bowel.  Obstruction is not excluded.  If in the colon, this suggest liquid stool.  This corresponds with multiple fluid filled small bowel loops on previous CT scan earlier today.  Residual contrast material in the bladder from recent CT injection.  Degenerative changes in the lumbar spine and hips.  IMPRESSION: Multiple bead-like air-fluid levels suggesting gas within predominately fluid-filled bowel.  This corresponds to a similar finding on CT.  No free intra-abdominal air.   Original Report Authenticated By: Burman Nieves, M.D.     Anti-infectives: Anti-infectives     Start     Dose/Rate Route Frequency Ordered Stop   06/02/12 1800   vancomycin (VANCOCIN) IVPB 1000 mg/200 mL premix        1,000 mg 200 mL/hr over 60 Minutes Intravenous Every 12 hours 06/02/12 0550     06/02/12 0600   metroNIDAZOLE (FLAGYL) IVPB 500 mg  500 mg 100 mL/hr over 60 Minutes Intravenous Every 8 hours 06/02/12 0547     06/02/12 0600   vancomycin (VANCOCIN) 2,500 mg in sodium chloride 0.9 % 500 mL IVPB        2,500 mg 250 mL/hr over 120 Minutes Intravenous  Once 06/02/12 0550 06/02/12 1008   06/01/12 2300  piperacillin-tazobactam (ZOSYN) IVPB 3.375 g       3.375 g 12.5 mL/hr over 240 Minutes Intravenous Every 8 hours 06/01/12 2250     06/01/12 1730   oseltamivir (TAMIFLU) capsule 75 mg  Status:  Discontinued        75 mg Oral 2 times daily 06/01/12 1713 06/02/12 0337          Assessment/Plan: s/p * No surgery found * Likely Acute Gastroenteritis with under-resuscitation. Pt feeling better and NGT not putting out much, clamped at MN with no  n/v. Tol ice chips -DC NGT -No surgical plans -start clears and can adv as tol -Call back if any questions or can be of further assistance   LOS: 2 days    Marigene Ehlers., [facility] 06/03/2012

## 2012-06-03 NOTE — Progress Notes (Signed)
Patient ambulated approximately 1000 ft with assistance.  HR up to 181-SVT on monitor.  Back to bed with assistance.  Metoprolol 5 mg IV PRN HR > 130.  Had not effect on HR.  Stat EKG done.  Placed on O2 at 2l/m.  Pt. Denies CP/SOB. CVP 15.  Dr. Marchelle Gearing notified of all parameters.  Orders rec'd to start diltizem drip.  Continue to monitor.

## 2012-06-03 NOTE — Progress Notes (Signed)
eLink Physician-Brief Progress Note Patient Name: Kimberly Sellers DOB: February 03, 1946 MRN: [id number]  Date of Service  06/03/2012   HPI/Events of Note   Sudden SVT 147 while ambulating. BP ok. Feels asymptomatic. Noticed to be on  Home xanax daily per RN though not on home med list  eICU Interventions  Xanax daily prn cardizem gtt   Intervention Category Major Interventions: Arrhythmia - evaluation and management  Sanoe Hazan 06/03/2012, 5:02 AM

## 2012-06-03 NOTE — Progress Notes (Signed)
eLink Physician-Brief Progress Note Patient Name: Kimberly Sellers DOB: 12-29-1945 MRN: [id number]  Date of Service  06/03/2012   HPI/Events of Note   Despite max cardizem hr 147  eICU Interventions  Give additional lopressor 5mg  IV Check labs - k, mag, phos before decidiong on amio/dig   Intervention Category Major Interventions: Arrhythmia - evaluation and management  Princ[place] Nabor 06/03/2012, 5:46 AM

## 2012-06-03 NOTE — Progress Notes (Signed)
[facility] PROGRESS NOTE  Kimberly Sellers WUJ:[id number] DOB: 1946/03/18 DOA: 06/01/2012 PCP: [name], MD  Brief narrative: 67 y/o woman with PMH only significant for HTN who presents with a 24 hour history of subjective fevers, chills, diffuse abdominal pain, n/v, body aches. She continues to have intractable n/v despite treatment in the ED and we have been asked to admit her for further treatment. CT scan in the ED was remarkable for ileus. NGT was placed with 3 L of feculent material obtained shortly after placement.  She developed a-fib with RVR, was noted to have lactic acidosis- there was a high concern for sepsis and was transferred to the ICU   Assessment/Plan: Principal Problem:  *Abdominal pain/  Lactic acidosis/ Nausea & vomiting/ Sepsis/ Ileus White count increased to 30s after admission and Lactic acid upto 8-  Now all resolved without a clear source of infection Pt on Zosyn/ Flagyl and Vanc- C diff was negative yesterday, therefore will d/c Flagyl  NG tube clamped today by surgery- started on clears  Active Problems:   Body aches/ Flu-like symptoms Resolved- influenza negative   A-fib w RVR- new onset On cardizem infusion- she has tolerated liquids, therefore will start short acting Cardizem.  Will obtain echo tsh normal CHA2DS2-VASc - is 3 Will need to discuss Xarelto  Hypokalemia Replacing -    Adrenal nodule Noted incidentally on CT abd  Code Status: full code Family Communication: none Disposition Plan: tx to SDU DVT prophylaxis: heparin   Consultants:  None   HPI/Subjective: Pt alert, laying in bed w/o complaints- half asleep- just had clear liquids -no abd pain or nausea- no diarrhea, no cough.   Objective: Filed Vitals:   06/03/12 0900 06/03/12 1000 06/03/12 1100 06/03/12 1200  BP: 120/59 124/60 120/57 128/61  Pulse: 100 96 97 98  Temp:      TempSrc:      Resp: 22 24 24 22   Height:      Weight:      SpO2: 100% 100% 99% 98%     Intake/Output Summary (Last 24 hours) at 06/03/12 1611 Last data filed at 06/03/12 1200  Gross per 24 hour  Intake 4587.5 ml  Output   1625 ml  Net 2962.5 ml    Exam:   General:  Obese, alert, no distress  Cardiovascular: IIRR, no murmurs  Respiratory: CTA b/l   Abdomen: soft, NT, ND< BS+  Ext: no c/c/e  Data Reviewed: Basic Metabolic Panel:  Lab 06/03/12 9562 06/02/12 0550 06/01/12 2155 06/01/12 1723 06/01/12 1255  NA 144 142 138 -- 140  K 3.0* 3.5 3.4* -- 3.7  CL 111 109 100 -- 102  CO2 21 19 18* -- 25  GLUCOSE 156* 146* 203* -- 144*  BUN 21 24* 17 -- 12  CREATININE 0.71 1.01 0.79 0.61 0.53  CALCIUM 8.3* 8.3* 10.0 -- 9.8  MG 1.9 -- -- -- --  PHOS 1.6* -- -- -- --   Liver Function Tests:  Lab 06/03/12 0547 06/02/12 0550 06/01/12 1255  AST 13 15 12   ALT 8 10 8   ALKPHOS 56 64 91  BILITOT 0.3 0.6 0.3  PROT 5.9* 5.9* 8.0  ALBUMIN 2.4* 2.4* 3.5    Lab 06/01/12 1255  LIPASE 20  AMYLASE --   No results found for this basename: AMMONIA:5 in the last 168 hours CBC:  Lab 06/03/12 0547 06/02/12 0550 06/01/12 1723 06/01/12 1119  WBC 13.1* 17.6* 33.8* 19.9*  NEUTROABS -- 14.9* -- 18.0*  HGB 12.5 14.0 15.7*  14.0  HCT 38.1 41.9 47.5* 42.0  MCV 74.0* 73.8* 75.6* 74.2*  PLT 396 413* 532* 444*   Cardiac Enzymes:  Lab 06/03/12 0546 06/02/12 1142  CKTOTAL -- --  CKMB -- --  CKMBINDEX -- --  TROPONINI <0.30 <0.30   BNP (last 3 results) No results found for this basename: PROBNP:3 in the last 8760 hours CBG: No results found for this basename: GLUCAP:5 in the last 168 hours  Recent Results (from the past 240 hour(s))  CLOSTRIDIUM DIFFICILE BY PCR     Status: Normal   Collection Time   06/02/12  1:35 PM      Component Value Range Status Comment   C difficile by pcr NEGATIVE  NEGATIVE Final      Studies: Dg Chest 2 View  06/01/2012  *RADIOLOGY REPORT*  Clinical Data: Flu-like symptoms  CHEST - 2 VIEW  Comparison: 05/24/2009  Findings: Normal heart  size.  No pleural effusion or edema.  The lung volumes are low.  Asymmetric elevation of the left hemidiaphragm is noted.  Scarring noted in the left lower lobe.  IMPRESSION:  1.  Low lung volumes with asymmetric elevation of the right hemidiaphragm.   Original Report Authenticated By: Signa Kell, M.D.    Ct Abdomen Pelvis W Contrast  06/01/2012  *RADIOLOGY REPORT*  Clinical Data: Nausea, vomiting, diarrhea, 52  CT ABDOMEN AND PELVIS WITH CONTRAST  Technique:  Multidetector CT imaging of the abdomen and pelvis was performed following the standard protocol during bolus administration of intravenous contrast.  Contrast: OMNIPAQUE IOHEXOL 300 MG/ML  SOLN  Comparison: None.  Findings: Post cholecystectomy.  Liver, pancreas, spleen, right adrenal gland are within normal limits.  1.3 cm left adrenal nodule is indeterminate.  Benign-appearing cyst in the left kidney.  Indeterminate hypodensities in the right kidney.  There is a central hypodensity in the right kidney on image 36.  Nondistended air and fluid-filled small bowel loops are scattered across abdomen with air-fluid levels.  No transition point to suggest obstruction.  Diverticulosis of the descending and sigmoid colon is present without definite evidence of acute diverticulitis.  Small amount of free fluid is seen within the mesentery on images 62-66.  There is also fluid extending from the tip of the liver and along the right paracolic gutter with an unusual and lobulated appearance.  Loculated fluid is suggestive of unknown significance.  Advanced degenerative disc disease with facet arthropathy is present in the lumbar spine.  An element of spinal stenosis occurs at L4-5.  Atherosclerotic changes of the aorta, mid SMA, and iliac arteries are noted.  No obvious focal significant stenosis.  Unremarkable bladder.  Uterus absent.  No obvious ovarian mass.  IMPRESSION: Small bowel air fluid levels in an ileus pattern and no definite transition point.   Small amount of free fluid in the mesentery suggesting an inflammatory process.  Nonspecific 1.3 cm left adrenal nodule.  If there is a history of malignancy or strong concern for malignancy, MRI can be performed to further characterize.  Loculated peritoneal fluid along the right paracolic gutter of unknown significance.  No obvious omental caking or enhancing peritoneal mass.  Degenerative changes in the lumbar spine.   Original Report Authenticated By: Jolaine Click, M.D.    Dg Chest Port 1 View  06/03/2012  *RADIOLOGY REPORT*  Clinical Data: Chest pain and cough.  Acute respiratory failure.  PORTABLE CHEST - 1 VIEW  Comparison: 1 day prior  Findings: Right IJ central line terminates at low SVC  versus caval atrial junction.  Nasogastric extends beyond the  inferior aspect of the film.  Numerous leads and wires project over the chest. Cardiomegaly accentuated by AP portable technique.  No pleural effusion or pneumothorax.  Low lung volumes with resultant pulmonary interstitial prominence.  IMPRESSION: No acute cardiopulmonary disease.   Original Report Authenticated By: Jeronimo Greaves, M.D.    Dg Chest Port 1 View  06/02/2012  *RADIOLOGY REPORT*  Clinical Data: Placement.  PORTABLE CHEST - 1 VIEW  Comparison: 06/02/2012 at 0435 hours  Findings: Shallow inspiration.  Cardiac enlargement with normal pulmonary vascularity given the technique.  Enteric tube remains unchanged in position in the upper stomach.  Interval placement of a right central venous catheter with tip overlying the cavoatrial junction.  No pneumothorax.  No developing consolidation in the lungs.  Probable atelectasis in the lung bases.  IMPRESSION: Interval placement of right central venous catheter with tip over the cavoatrial junction.   Original Report Authenticated By: Burman Nieves, M.D.    Dg Chest Port 1 View  06/02/2012  *RADIOLOGY REPORT*  Clinical Data: Abdominal pain.  Nausea and vomiting.  PORTABLE CHEST - 1 VIEW  Comparison:  06/01/2012  Findings: Shallow inspiration.  Mild cardiac enlargement with normal pulmonary vascularity.  Linear fibrosis in the left mid lung.  No focal airspace consolidation.  No pneumothorax.  No free intra-abdominal air is noted.  Since the previous study, an enteric tube has been placed with tip below the left hemidiaphragm consistent with location in the lower stomach.  Otherwise no significant change.  IMPRESSION: Shallow inspiration with mild cardiac enlargement.  No active consolidation.   Original Report Authenticated By: Burman Nieves, M.D.    Dg Abd Portable 1v  06/02/2012  *RADIOLOGY REPORT*  Clinical Data: Concern for perforated bowel.  Abdominal pain.  PORTABLE ABDOMEN - 1 VIEW  Comparison: CT abdomen and pelvis 06/01/2012.  The  Findings: Limited upright portable view of the abdomen demonstrates no evidence of free intra-abdominal air.  The lower abdomen is not well included and bowel gas pattern cannot be commented upon. The study is technically limited due to motion artifact.  IMPRESSION: No free air demonstrated on limited upright view.   Original Report Authenticated By: Burman Nieves, M.D.    Dg Abd Portable 2v  06/02/2012  *RADIOLOGY REPORT*  Clinical Data: Abdominal pain.  Nausea and vomiting.  PORTABLE ABDOMEN - 2 VIEW  Comparison: 06/02/2012 at 0253 hours.  Findings: Scattered gas and stool throughout the colon.  No small or large bowel distension.  No free intra-abdominal air.  There are small air-fluid levels suggesting strain of [place] which can be associated with dilated fluid-filled bowel.  Obstruction is not excluded.  If in the colon, this suggest liquid stool.  This corresponds with multiple fluid filled small bowel loops on previous CT scan earlier today.  Residual contrast material in the bladder from recent CT injection.  Degenerative changes in the lumbar spine and hips.  IMPRESSION: Multiple bead-like air-fluid levels suggesting gas within predominately fluid-filled  bowel.  This corresponds to a similar finding on CT.  No free intra-abdominal air.   Original Report Authenticated By: Burman Nieves, M.D.     Scheduled Meds:   . aspirin EC  81 mg Oral Daily  . heparin subcutaneous  5,000 Units Subcutaneous Q8H  . metronidazole  500 mg Intravenous Q8H  . pantoprazole (PROTONIX) IV  40 mg Intravenous Q24H  . piperacillin-tazobactam (ZOSYN)  IV  3.375 g Intravenous Q8H  . vancomycin  1,000 mg Intravenous Q12H   Continuous Infusions:   . sodium chloride 150 mL (06/02/12 1722)  . diltiazem (CARDIZEM) infusion 10 mg/hr (06/03/12 0925)    ________________________________________________________________________  Time spent: 35 min    [facility]  [facility] Pager [phone] If 8PM-8AM, please contact night-coverage at [email or web address], password [facility] 06/03/2012, 4:11 PM  LOS: 2 days

## 2012-06-04 DIAGNOSIS — K560 Paralytic ileus: Secondary | ICD-10-CM

## 2012-06-04 DIAGNOSIS — D72829 Elevated white blood cell count, unspecified: Secondary | ICD-10-CM

## 2012-06-04 LAB — CBC
Platelets: 340 10*3/uL (ref 150–400)
RBC: 4.45 MIL/uL (ref 3.87–5.11)
RDW: 17.3 % — ABNORMAL HIGH (ref 11.5–15.5)
WBC: 18.2 10*3/uL — ABNORMAL HIGH (ref 4.0–10.5)

## 2012-06-04 LAB — BASIC METABOLIC PANEL
CO2: 23 mEq/L (ref 19–32)
Chloride: 110 mEq/L (ref 96–112)
GFR calc Af Amer: >90 mL/min (ref >90)
Potassium: 3.3 mEq/L — ABNORMAL LOW (ref 3.5–5.1)
Sodium: 142 mEq/L (ref 135–145)

## 2012-06-04 LAB — PHOSPHORUS: Phosphorus: 1.5 mg/dL — ABNORMAL LOW (ref 2.3–4.6)

## 2012-06-04 LAB — MAGNESIUM: Magnesium: 2 mg/dL (ref 1.5–2.5)

## 2012-06-04 MED ORDER — BOOST / RESOURCE BREEZE PO LIQD
1.0000 | Freq: Three times a day (TID) | ORAL | Status: DC
  Administered 2012-06-04 – 2012-06-05 (×5): 1 via ORAL

## 2012-06-04 MED ORDER — ALPRAZOLAM 0.25 MG PO TABS
0.2500 mg | ORAL_TABLET | Freq: Once | ORAL | Status: AC
  Administered 2012-06-05: 0.25 mg via ORAL
  Filled 2012-06-04: qty 1

## 2012-06-04 MED ORDER — CHOLESTYRAMINE LIGHT 4 G PO PACK
4.0000 g | PACK | Freq: Two times a day (BID) | ORAL | Status: AC
  Filled 2012-06-04 (×3): qty 1

## 2012-06-04 NOTE — Progress Notes (Signed)
Echocardiogram 2D Echocardiogram has been performed.  Kimberly Sellers 06/04/2012, 9:41 AM

## 2012-06-04 NOTE — Progress Notes (Signed)
NUTRITION FOLLOW UP  DOCUMENTATION CODES  Per approved criteria  -Obesity Unspecified   Intervention:   1. Resource breeze TID to provide 100 kcal and 15 g protein per 30 mL  Nutrition Dx:   Inadequate oral intake now related to nausea as evidenced by pt report and poor po intake.   Goal:   Patient to meet >/=90% of estimated nutrition needs.   Monitor:   Weight trends, labs, I/Os, PO intake, nausea    Assessment:   Pt admitted with abdominal pain, nausea, and vomiting. CT scan showed ileus.  Overnight she developed worsening abdominal pain and tachycardia. Venous lactate was collected and found to be elevated at 8. She was transferred to the ICU. NGT was placed with 3 L of feculent material obtained shortly after placement. Her pain and nausea dramatically improved afterwards.  Rd spoke with pt and daughter since diet advancement to clear liquids.  Pt still experiencing some nausea. For breakfast, pt only able to consume a little broth but none of the other liquids. Pt open to Resource Breeze supplements since unable to consume 100% of meals at this time.     Height: Ht Readings from Last 1 Encounters:  06/01/12 5\' 5"  (1.651 m)    Weight Status:   Wt Readings from Last 1 Encounters:  06/01/12 239 lb 13.8 oz (108.8 kg)  No new weight information at this time.   Re-estimated needs:  Kcal: 1950 - 2100 kcal  Protein: 110 - 120 g daily  Fluid: 1.9 - 2 L   Skin: Intact   Diet Order: Clear Liquid   Intake/Output Summary (Last 24 hours) at 06/04/12 0946 Last data filed at 06/04/12 0600  Gross per 24 hour  Intake   1350 ml  Output    750 ml  Net    600 ml    Last BM: 06/02/2012   Labs:   Lab 06/04/12 0445 06/03/12 0547 06/02/12 0550  NA 142 144 142  K 3.3* 3.0* 3.5  CL 110 111 109  CO2 23 21 19   BUN 12 21 24*  CREATININE 0.70 0.71 1.01  CALCIUM 8.2* 8.3* 8.3*  MG 2.0 1.9 --  PHOS 1.5* 1.6* --  GLUCOSE 87 156* 146*    CBG (last 3)  No results found for  this basename: GLUCAP:3 in the last 72 hours  Scheduled Meds:   . aspirin EC  81 mg Oral Daily  . diltiazem  60 mg Oral Q6H  . heparin subcutaneous  5,000 Units Subcutaneous Q8H  . metronidazole  500 mg Intravenous Q8H  . pantoprazole (PROTONIX) IV  40 mg Intravenous Q24H  . piperacillin-tazobactam (ZOSYN)  IV  3.375 g Intravenous Q8H  . vancomycin  1,000 mg Intravenous Q12H    Continuous Infusions:   . sodium chloride 75 mL/hr at 06/03/12 2012  . diltiazem (CARDIZEM) infusion 10 mg/hr (06/03/12 0925)   Belenda Cruise  Dietetic Intern Pager: [phone]  I agree with the above information. Jarold Motto MS, RD, LDN Pager: [phone] After-hours pager: [phone]

## 2012-06-04 NOTE — Progress Notes (Signed)
Patient ID: Kimberly Sellers  female  WGN:[id number]    DOB: 12/07/1945    DOA: 06/01/2012  PCP: [name], MD  Assessment/Plan: Abdominal pain/ Lactic acidosis/ Nausea & vomiting/ Sepsis/ Ileus  - leukocytosis improving, without a clear source of infection  Pt on Zosyn/ Flagyl and Vanc- C diff was negative yesterday Advance diet to full liquids, cholestyramine x2 for diarrhea  Active Problems:  Body aches/ Flu-like symptoms  Resolved- influenza negative   A-fib w RVR- new onset  - on oral cardizem - ECHO : EF 50-55% tsh normal  CHA2DS2-VASc - is 3,  Will need to discuss Xarelto   Adrenal nodule  Noted incidentally on CT abd  DVT Prophylaxis:  Code Status: Full  Disposition:not ready    Subjective: abd distension improving, miserable with diarrhea, tolerating clears  Objective: Weight change:   Intake/Output Summary (Last 24 hours) at 06/04/12 1753 Last data filed at 06/04/12 1430  Gross per 24 hour  Intake   1260 ml  Output   1725 ml  Net   -465 ml   Blood pressure 151/69, pulse 78, temperature 97.8 F (36.6 C), temperature source Oral, resp. rate 20, height 5\' 5"  (1.651 m), weight 108.8 kg (239 lb 13.8 oz), SpO2 97.00%.  Physical Exam: General: Alert and awake, oriented x3, not in any acute distress. HEENT: anicteric sclera, pupils reactive to light and accommodation, EOMI CVS: S1-S2 clear, no murmur rubs or gallops Chest: clear to auscultation bilaterally, no wheezing, rales or rhonchi Abdomen: soft nontender, distended, normal bowel sounds, no organomegaly Extremities: no cyanosis, clubbing or edema noted bilaterally   Lab Results: Basic Metabolic Panel:  Lab 06/04/12 7846 06/03/12 0547  NA 142 144  K 3.3* 3.0*  CL 110 111  CO2 23 21  GLUCOSE 87 156*  BUN 12 21  CREATININE 0.70 0.71  CALCIUM 8.2* 8.3*  MG 2.0 --  PHOS 1.5* --   Liver Function Tests:  Lab 06/03/12 0547 06/02/12 0550  AST 13 15  ALT 8 10  ALKPHOS 56 64  BILITOT 0.3 0.6    PROT 5.9* 5.9*  ALBUMIN 2.4* 2.4*    Lab 06/01/12 1255  LIPASE 20  AMYLASE --   No results found for this basename: AMMONIA:2 in the last 168 hours CBC:  Lab 06/04/12 0445 06/03/12 0547 06/02/12 0550  WBC 18.2* 13.1* --  NEUTROABS -- -- 14.9*  HGB 10.8* 12.5 --  HCT 33.0* 38.1 --  MCV 74.2* 74.0* --  PLT 340 396 --   Cardiac Enzymes:  Lab 06/03/12 0546 06/02/12 1142  CKTOTAL -- --  CKMB -- --  CKMBINDEX -- --  TROPONINI <0.30 <0.30   BNP: No components found with this basename: POCBNP:2 CBG: No results found for this basename: GLUCAP:5 in the last 168 hours   Micro Results: Recent Results (from the past 240 hour(s))  CULTURE, BLOOD (ROUTINE X 2)     Status: Normal (Preliminary result)   Collection Time   06/02/12 11:42 AM      Component Value Range Status Comment   Specimen Description BLOOD RIGHT ARM   Final    Special Requests BOTTLES DRAWN AEROBIC AND ANAEROBIC 10CC   Final    Culture  Setup Time 06/02/2012 20:53   Final    Culture     Final    Value:        BLOOD CULTURE RECEIVED NO GROWTH TO DATE CULTURE WILL BE HELD FOR 5 DAYS BEFORE ISSUING A FINAL NEGATIVE REPORT   Report Status  PENDING   Incomplete   CULTURE, BLOOD (ROUTINE X 2)     Status: Normal (Preliminary result)   Collection Time   06/02/12 11:48 AM      Component Value Range Status Comment   Specimen Description BLOOD RIGHT HAND   Final    Special Requests BOTTLES DRAWN AEROBIC ONLY 10CC   Final    Culture  Setup Time 06/02/2012 20:53   Final    Culture     Final    Value:        BLOOD CULTURE RECEIVED NO GROWTH TO DATE CULTURE WILL BE HELD FOR 5 DAYS BEFORE ISSUING A FINAL NEGATIVE REPORT   Report Status PENDING   Incomplete   CLOSTRIDIUM DIFFICILE BY PCR     Status: Normal   Collection Time   06/02/12  1:35 PM      Component Value Range Status Comment   C difficile by pcr NEGATIVE  NEGATIVE Final     Studies/Results: Dg Chest 2 View  06/01/2012  *RADIOLOGY REPORT*  Clinical Data:  Flu-like symptoms  CHEST - 2 VIEW  Comparison: 05/24/2009  Findings: Normal heart size.  No pleural effusion or edema.  The lung volumes are low.  Asymmetric elevation of the left hemidiaphragm is noted.  Scarring noted in the left lower lobe.  IMPRESSION:  1.  Low lung volumes with asymmetric elevation of the right hemidiaphragm.   Original Report Authenticated By: Signa Kell, M.D.    Ct Abdomen Pelvis W Contrast  06/01/2012  *RADIOLOGY REPORT*  Clinical Data: Nausea, vomiting, diarrhea, 50  CT ABDOMEN AND PELVIS WITH CONTRAST  Technique:  Multidetector CT imaging of the abdomen and pelvis was performed following the standard protocol during bolus administration of intravenous contrast.  Contrast: OMNIPAQUE IOHEXOL 300 MG/ML  SOLN  Comparison: None.  Findings: Post cholecystectomy.  Liver, pancreas, spleen, right adrenal gland are within normal limits.  1.3 cm left adrenal nodule is indeterminate.  Benign-appearing cyst in the left kidney.  Indeterminate hypodensities in the right kidney.  There is a central hypodensity in the right kidney on image 36.  Nondistended air and fluid-filled small bowel loops are scattered across abdomen with air-fluid levels.  No transition point to suggest obstruction.  Diverticulosis of the descending and sigmoid colon is present without definite evidence of acute diverticulitis.  Small amount of free fluid is seen within the mesentery on images 62-66.  There is also fluid extending from the tip of the liver and along the right paracolic gutter with an unusual and lobulated appearance.  Loculated fluid is suggestive of unknown significance.  Advanced degenerative disc disease with facet arthropathy is present in the lumbar spine.  An element of spinal stenosis occurs at L4-5.  Atherosclerotic changes of the aorta, mid SMA, and iliac arteries are noted.  No obvious focal significant stenosis.  Unremarkable bladder.  Uterus absent.  No obvious ovarian mass.  IMPRESSION:  Small bowel air fluid levels in an ileus pattern and no definite transition point.  Small amount of free fluid in the mesentery suggesting an inflammatory process.  Nonspecific 1.3 cm left adrenal nodule.  If there is a history of malignancy or strong concern for malignancy, MRI can be performed to further characterize.  Loculated peritoneal fluid along the right paracolic gutter of unknown significance.  No obvious omental caking or enhancing peritoneal mass.  Degenerative changes in the lumbar spine.   Original Report Authenticated By: Jolaine Click, M.D.    Dg Chest [facility]  06/03/2012  *RADIOLOGY REPORT*  Clinical Data: Chest pain and cough.  Acute respiratory failure.  PORTABLE CHEST - 1 VIEW  Comparison: 1 day prior  Findings: Right IJ central line terminates at low SVC versus caval atrial junction.  Nasogastric extends beyond the  inferior aspect of the film.  Numerous leads and wires project over the chest. Cardiomegaly accentuated by AP portable technique.  No pleural effusion or pneumothorax.  Low lung volumes with resultant pulmonary interstitial prominence.  IMPRESSION: No acute cardiopulmonary disease.   Original Report Authenticated By: Jeronimo Greaves, M.D.    Dg Chest Port 1 View  06/02/2012  *RADIOLOGY REPORT*  Clinical Data: Placement.  PORTABLE CHEST - 1 VIEW  Comparison: 06/02/2012 at 0435 hours  Findings: Shallow inspiration.  Cardiac enlargement with normal pulmonary vascularity given the technique.  Enteric tube remains unchanged in position in the upper stomach.  Interval placement of a right central venous catheter with tip overlying the cavoatrial junction.  No pneumothorax.  No developing consolidation in the lungs.  Probable atelectasis in the lung bases.  IMPRESSION: Interval placement of right central venous catheter with tip over the cavoatrial junction.   Original Report Authenticated By: Burman Nieves, M.D.    Dg Chest Port 1 View  06/02/2012  *RADIOLOGY REPORT*  Clinical  Data: Abdominal pain.  Nausea and vomiting.  PORTABLE CHEST - 1 VIEW  Comparison: 06/01/2012  Findings: Shallow inspiration.  Mild cardiac enlargement with normal pulmonary vascularity.  Linear fibrosis in the left mid lung.  No focal airspace consolidation.  No pneumothorax.  No free intra-abdominal air is noted.  Since the previous study, an enteric tube has been placed with tip below the left hemidiaphragm consistent with location in the lower stomach.  Otherwise no significant change.  IMPRESSION: Shallow inspiration with mild cardiac enlargement.  No active consolidation.   Original Report Authenticated By: Burman Nieves, M.D.    Dg Abd Portable 1v  06/02/2012  *RADIOLOGY REPORT*  Clinical Data: Concern for perforated bowel.  Abdominal pain.  PORTABLE ABDOMEN - 1 VIEW  Comparison: CT abdomen and pelvis 06/01/2012.  The  Findings: Limited upright portable view of the abdomen demonstrates no evidence of free intra-abdominal air.  The lower abdomen is not well included and bowel gas pattern cannot be commented upon. The study is technically limited due to motion artifact.  IMPRESSION: No free air demonstrated on limited upright view.   Original Report Authenticated By: Burman Nieves, M.D.    Dg Abd Portable 2v  06/02/2012  *RADIOLOGY REPORT*  Clinical Data: Abdominal pain.  Nausea and vomiting.  PORTABLE ABDOMEN - 2 VIEW  Comparison: 06/02/2012 at 0253 hours.  Findings: Scattered gas and stool throughout the colon.  No small or large bowel distension.  No free intra-abdominal air.  There are small air-fluid levels suggesting strain of [place] which can be associated with dilated fluid-filled bowel.  Obstruction is not excluded.  If in the colon, this suggest liquid stool.  This corresponds with multiple fluid filled small bowel loops on previous CT scan earlier today.  Residual contrast material in the bladder from recent CT injection.  Degenerative changes in the lumbar spine and hips.  IMPRESSION:  Multiple bead-like air-fluid levels suggesting gas within predominately fluid-filled bowel.  This corresponds to a similar finding on CT.  No free intra-abdominal air.   Original Report Authenticated By: Burman Nieves, M.D.     Medications: Scheduled Meds:   . aspirin EC  81 mg Oral Daily  . cholestyramine light  4 g Oral BID WC  . diltiazem  60 mg Oral Q6H  . feeding supplement  1 Container Oral TID BM  . heparin subcutaneous  5,000 Units Subcutaneous Q8H  . pantoprazole (PROTONIX) IV  40 mg Intravenous Q24H  . piperacillin-tazobactam (ZOSYN)  IV  3.375 g Intravenous Q8H  . vancomycin  1,000 mg Intravenous Q12H      LOS: 3 days   RAI,RIPUDEEP M.D. [facility] 06/04/2012, 5:53 PM Pager: [phone]  If 7PM-7AM, please contact night-coverage [email or web address] Password [email or web address]

## 2012-06-05 LAB — MAGNESIUM: Magnesium: 1.9 mg/dL (ref 1.5–2.5)

## 2012-06-05 MED ORDER — LOPERAMIDE HCL 2 MG PO CAPS
2.0000 mg | ORAL_CAPSULE | Freq: Two times a day (BID) | ORAL | Status: DC
  Administered 2012-06-05 – 2012-06-11 (×11): 2 mg via ORAL
  Filled 2012-06-05 (×16): qty 1

## 2012-06-05 NOTE — Progress Notes (Signed)
ANTIBIOTIC CONSULT NOTE - FOLLOW UP  Pharmacy Consult:  Vancomycin / Zosyn Indication:  Sepsis   No Known Allergies  Patient Measurements: Height: 5\' 5"  (165.1 cm) Weight: 239 lb 13.8 oz (108.8 kg) IBW/kg (Calculated) : 57   Vital Signs: Temp: 98.6 F (37 C) (01/21 0510) BP: 159/72 mmHg (01/21 0510) Pulse Rate: 79  (01/21 0510) Intake/Output from previous day: 01/20 0701 - 01/21 0700 In: 2095 [P.O.:360; I.V.:1735] Out: 2250 [Urine:2250]  Labs:  [facility] 06/04/12 0445 06/03/12 0547  WBC 18.2* 13.1*  HGB 10.8* 12.5  PLT 340 396  LABCREA -- --  CREATININE 0.70 0.71   Estimated Creatinine Clearance: 82.6 ml/min (by C-G formula based on Cr of 0.7). No results found for this basename: VANCOTROUGH:2,VANCOPEAK:2,VANCORANDOM:2,GENTTROUGH:2,GENTPEAK:2,GENTRANDOM:2,TOBRATROUGH:2,TOBRAPEAK:2,TOBRARND:2,AMIKACINPEAK:2,AMIKACINTROU:2,AMIKACIN:2, in the last 72 hours   Microbiology: Recent Results (from the past 720 hour(s))  CULTURE, BLOOD (ROUTINE X 2)     Status: Normal (Preliminary result)   Collection Time   06/02/12 11:42 AM      Component Value Range Status Comment   Specimen Description BLOOD RIGHT ARM   Final    Special Requests BOTTLES DRAWN AEROBIC AND ANAEROBIC 10CC   Final    Culture  Setup Time 06/02/2012 20:53   Final    Culture     Final    Value:        BLOOD CULTURE RECEIVED NO GROWTH TO DATE CULTURE WILL BE HELD FOR 5 DAYS BEFORE ISSUING A FINAL NEGATIVE REPORT   Report Status PENDING   Incomplete   CULTURE, BLOOD (ROUTINE X 2)     Status: Normal (Preliminary result)   Collection Time   06/02/12 11:48 AM      Component Value Range Status Comment   Specimen Description BLOOD RIGHT HAND   Final    Special Requests BOTTLES DRAWN AEROBIC ONLY 10CC   Final    Culture  Setup Time 06/02/2012 20:53   Final    Culture     Final    Value:        BLOOD CULTURE RECEIVED NO GROWTH TO DATE CULTURE WILL BE HELD FOR 5 DAYS BEFORE ISSUING A FINAL NEGATIVE REPORT   Report  Status PENDING   Incomplete   CLOSTRIDIUM DIFFICILE BY PCR     Status: Normal   Collection Time   06/02/12  1:35 PM      Component Value Range Status Comment   C difficile by pcr NEGATIVE  NEGATIVE Final        Assessment: 14 YOF admitted with ileus and started on empiric vancomycin and Zosyn for tachycardia and elevated lactic acid level that were concerning for sepsis versus underlying abdominal process.  Patient now stable and afebrile, but WBC has increased on 06/04/12.  Her renal function remains stable and her UOP is improving  Zosyn 1/17 >> 1/20 Vanc 1/18 >> Flagyl 1/18 >>  1/18 BCx - NGTD 1/18 Cdiff--negative   Goal of Therapy:  Vancomycin trough level 15-20 mcg/ml   Plan:  - Zosyn 3.375gm IV Q8H, 4 hr infusion - Vanc1000mg  IV Q12H - Monitor renal fxn, clinical progression, vanc trough soon if abx not discontinued - F/U Phos supplementation - F/U abx LOT/de-escalating abx, resume home meds    Mason Dibiasio D. Laney Potash, PharmD, BCPS Pager:  [phone] 06/05/2012, 9:48 AM

## 2012-06-05 NOTE — Progress Notes (Signed)
Patient ID: Kimberly Sellers  female  AOZ:[id number]    DOB: Jul 27, 1945    DOA: 06/01/2012  PCP: [name], MD  Interim summary: 67 y/o woman with PMH only significant for HTN who presents with a 24 hour history of subjective fevers, chills, diffuse abdominal pain, n/v, body aches. She continues to have intractable n/v despite treatment in the ED and we have been asked to admit her for further treatment. CT scan in the ED was remarkable for ileus. NGT was placed with 3 L of feculent material obtained shortly after placement.  She developed a-fib with RVR, was noted to have lactic acidosis- there was a high concern for sepsis and was transferred to the ICU. Patient was transferred to floor on 1/20  Consults: PCCM : signed off CCS: signed off   Assessment/Plan: Abdominal pain/ Lactic acidosis/ Nausea & vomiting/ Sepsis/ Ileus: having diarrhea, 3BM since AM  - leukocytosis improving, without a clear source of infection  - Pt was placed on Zosyn/ Flagyl and Van.- C diff was negative, so flagyl was dc'ed  - on full liquids,doesnot want solids yet, cholestyramine x2 for diarrhea, imodium  Active Problems:  Body aches/ Flu-like symptoms  Resolved- influenza negative   A-fib w RVR- new onset  - on oral cardizem - ECHO : EF 50-55% - tsh normal,   CHA2DS2-VASc - is 3,  Will need to discuss Xarelto   Adrenal nodule  Noted incidentally on CT abd  DVT Prophylaxis:  Code Status: Full  Disposition: not ready    Subjective: abd distension improving, diarrhea x 3 today, tolerating full liquids but doesnot want solids yet  Objective: Weight change:   Intake/Output Summary (Last 24 hours) at 06/05/12 1410 Last data filed at 06/05/12 1359  Gross per 24 hour  Intake   2575 ml  Output   3000 ml  Net   -425 ml   Blood pressure 141/71, pulse 66, temperature 99.3 F (37.4 C), temperature source Oral, resp. rate 18, height 5\' 5"  (1.651 m), weight 108.8 kg (239 lb 13.8 oz), SpO2  99.00%.  Physical Exam: General: Alert and awake, oriented x3, NAD CVS: S1-S2 clear, no murmur rubs or gallops Chest: CTAB Abdomen: obese, soft nontender, distended, normal bowel sounds,  Extremities: no c/c/e BL  Lab Results: Basic Metabolic Panel:  Lab 06/05/12 9629 06/04/12 0445 06/03/12 0547  NA -- 142 144  K -- 3.3* 3.0*  CL -- 110 111  CO2 -- 23 21  GLUCOSE -- 87 156*  BUN -- 12 21  CREATININE -- 0.70 0.71  CALCIUM -- 8.2* 8.3*  MG 1.9 -- --  PHOS 2.2* -- --   Liver Function Tests:  Lab 06/03/12 0547 06/02/12 0550  AST 13 15  ALT 8 10  ALKPHOS 56 64  BILITOT 0.3 0.6  PROT 5.9* 5.9*  ALBUMIN 2.4* 2.4*    Lab 06/01/12 1255  LIPASE 20  AMYLASE --   CBC:  Lab 06/04/12 0445 06/03/12 0547 06/02/12 0550  WBC 18.2* 13.1* --  NEUTROABS -- -- 14.9*  HGB 10.8* 12.5 --  HCT 33.0* 38.1 --  MCV 74.2* 74.0* --  PLT 340 396 --   Cardiac Enzymes:  Lab 06/03/12 0546 06/02/12 1142  CKTOTAL -- --  CKMB -- --  CKMBINDEX -- --  TROPONINI <0.30 <0.30     Micro Results: Recent Results (from the past 240 hour(s))  CULTURE, BLOOD (ROUTINE X 2)     Status: Normal (Preliminary result)   Collection Time   06/02/12 11:42  AM      Component Value Range Status Comment   Specimen Description BLOOD RIGHT ARM   Final    Special Requests BOTTLES DRAWN AEROBIC AND ANAEROBIC 10CC   Final    Culture  Setup Time 06/02/2012 20:53   Final    Culture     Final    Value:        BLOOD CULTURE RECEIVED NO GROWTH TO DATE CULTURE WILL BE HELD FOR 5 DAYS BEFORE ISSUING A FINAL NEGATIVE REPORT   Report Status PENDING   Incomplete   CULTURE, BLOOD (ROUTINE X 2)     Status: Normal (Preliminary result)   Collection Time   06/02/12 11:48 AM      Component Value Range Status Comment   Specimen Description BLOOD RIGHT HAND   Final    Special Requests BOTTLES DRAWN AEROBIC ONLY 10CC   Final    Culture  Setup Time 06/02/2012 20:53   Final    Culture     Final    Value:        BLOOD CULTURE  RECEIVED NO GROWTH TO DATE CULTURE WILL BE HELD FOR 5 DAYS BEFORE ISSUING A FINAL NEGATIVE REPORT   Report Status PENDING   Incomplete   CLOSTRIDIUM DIFFICILE BY PCR     Status: Normal   Collection Time   06/02/12  1:35 PM      Component Value Range Status Comment   C difficile by pcr NEGATIVE  NEGATIVE Final     Studies/Results: Dg Chest 2 View  06/01/2012  *RADIOLOGY REPORT*  Clinical Data: Flu-like symptoms  CHEST - 2 VIEW  Comparison: 05/24/2009  Findings: Normal heart size.  No pleural effusion or edema.  The lung volumes are low.  Asymmetric elevation of the left hemidiaphragm is noted.  Scarring noted in the left lower lobe.  IMPRESSION:  1.  Low lung volumes with asymmetric elevation of the right hemidiaphragm.   Original Report Authenticated By: Signa Kell, M.D.    Ct Abdomen Pelvis W Contrast  06/01/2012  *RADIOLOGY REPORT*  Clinical Data: Nausea, vomiting, diarrhea, 52  CT ABDOMEN AND PELVIS WITH CONTRAST  Technique:  Multidetector CT imaging of the abdomen and pelvis was performed following the standard protocol during bolus administration of intravenous contrast.  Contrast: OMNIPAQUE IOHEXOL 300 MG/ML  SOLN  Comparison: None.  Findings: Post cholecystectomy.  Liver, pancreas, spleen, right adrenal gland are within normal limits.  1.3 cm left adrenal nodule is indeterminate.  Benign-appearing cyst in the left kidney.  Indeterminate hypodensities in the right kidney.  There is a central hypodensity in the right kidney on image 36.  Nondistended air and fluid-filled small bowel loops are scattered across abdomen with air-fluid levels.  No transition point to suggest obstruction.  Diverticulosis of the descending and sigmoid colon is present without definite evidence of acute diverticulitis.  Small amount of free fluid is seen within the mesentery on images 62-66.  There is also fluid extending from the tip of the liver and along the right paracolic gutter with an unusual and lobulated  appearance.  Loculated fluid is suggestive of unknown significance.  Advanced degenerative disc disease with facet arthropathy is present in the lumbar spine.  An element of spinal stenosis occurs at L4-5.  Atherosclerotic changes of the aorta, mid SMA, and iliac arteries are noted.  No obvious focal significant stenosis.  Unremarkable bladder.  Uterus absent.  No obvious ovarian mass.  IMPRESSION: Small bowel air fluid levels in an ileus  pattern and no definite transition point.  Small amount of free fluid in the mesentery suggesting an inflammatory process.  Nonspecific 1.3 cm left adrenal nodule.  If there is a history of malignancy or strong concern for malignancy, MRI can be performed to further characterize.  Loculated peritoneal fluid along the right paracolic gutter of unknown significance.  No obvious omental caking or enhancing peritoneal mass.  Degenerative changes in the lumbar spine.   Original Report Authenticated By: Jolaine Click, M.D.    Dg Chest Port 1 View  06/03/2012  *RADIOLOGY REPORT*  Clinical Data: Chest pain and cough.  Acute respiratory failure.  PORTABLE CHEST - 1 VIEW  Comparison: 1 day prior  Findings: Right IJ central line terminates at low SVC versus caval atrial junction.  Nasogastric extends beyond the  inferior aspect of the film.  Numerous leads and wires project over the chest. Cardiomegaly accentuated by AP portable technique.  No pleural effusion or pneumothorax.  Low lung volumes with resultant pulmonary interstitial prominence.  IMPRESSION: No acute cardiopulmonary disease.   Original Report Authenticated By: Jeronimo Greaves, M.D.    Dg Chest Port 1 View  06/02/2012  *RADIOLOGY REPORT*  Clinical Data: Placement.  PORTABLE CHEST - 1 VIEW  Comparison: 06/02/2012 at 0435 hours  Findings: Shallow inspiration.  Cardiac enlargement with normal pulmonary vascularity given the technique.  Enteric tube remains unchanged in position in the upper stomach.  Interval placement of a right  central venous catheter with tip overlying the cavoatrial junction.  No pneumothorax.  No developing consolidation in the lungs.  Probable atelectasis in the lung bases.  IMPRESSION: Interval placement of right central venous catheter with tip over the cavoatrial junction.   Original Report Authenticated By: Burman Nieves, M.D.    Dg Chest Port 1 View  06/02/2012  *RADIOLOGY REPORT*  Clinical Data: Abdominal pain.  Nausea and vomiting.  PORTABLE CHEST - 1 VIEW  Comparison: 06/01/2012  Findings: Shallow inspiration.  Mild cardiac enlargement with normal pulmonary vascularity.  Linear fibrosis in the left mid lung.  No focal airspace consolidation.  No pneumothorax.  No free intra-abdominal air is noted.  Since the previous study, an enteric tube has been placed with tip below the left hemidiaphragm consistent with location in the lower stomach.  Otherwise no significant change.  IMPRESSION: Shallow inspiration with mild cardiac enlargement.  No active consolidation.   Original Report Authenticated By: Burman Nieves, M.D.    Dg Abd Portable 1v  06/02/2012  *RADIOLOGY REPORT*  Clinical Data: Concern for perforated bowel.  Abdominal pain.  PORTABLE ABDOMEN - 1 VIEW  Comparison: CT abdomen and pelvis 06/01/2012.  The  Findings: Limited upright portable view of the abdomen demonstrates no evidence of free intra-abdominal air.  The lower abdomen is not well included and bowel gas pattern cannot be commented upon. The study is technically limited due to motion artifact.  IMPRESSION: No free air demonstrated on limited upright view.   Original Report Authenticated By: Burman Nieves, M.D.    Dg Abd Portable 2v  06/02/2012  *RADIOLOGY REPORT*  Clinical Data: Abdominal pain.  Nausea and vomiting.  PORTABLE ABDOMEN - 2 VIEW  Comparison: 06/02/2012 at 0253 hours.  Findings: Scattered gas and stool throughout the colon.  No small or large bowel distension.  No free intra-abdominal air.  There are small air-fluid  levels suggesting strain of [place] which can be associated with dilated fluid-filled bowel.  Obstruction is not excluded.  If in the colon, this suggest liquid stool.  This corresponds with multiple fluid filled small bowel loops on previous CT scan earlier today.  Residual contrast material in the bladder from recent CT injection.  Degenerative changes in the lumbar spine and hips.  IMPRESSION: Multiple bead-like air-fluid levels suggesting gas within predominately fluid-filled bowel.  This corresponds to a similar finding on CT.  No free intra-abdominal air.   Original Report Authenticated By: Burman Nieves, M.D.     Medications: Scheduled Meds:    . aspirin EC  81 mg Oral Daily  . cholestyramine light  4 g Oral BID WC  . diltiazem  60 mg Oral Q6H  . feeding supplement  1 Container Oral TID BM  . heparin subcutaneous  5,000 Units Subcutaneous Q8H  . loperamide  2 mg Oral BID  . pantoprazole (PROTONIX) IV  40 mg Intravenous Q24H  . piperacillin-tazobactam (ZOSYN)  IV  3.375 g Intravenous Q8H  . vancomycin  1,000 mg Intravenous Q12H      LOS: 4 days   RAI,RIPUDEEP M.D. [facility] 06/05/2012, 2:10 PM Pager: [phone]  If 7PM-7AM, please contact night-coverage [email or web address] Password [email or web address]

## 2012-06-06 LAB — CBC
MCHC: 32.1 g/dL (ref 30.0–36.0)
Platelets: 378 10*3/uL (ref 150–400)
RDW: 17.5 % — ABNORMAL HIGH (ref 11.5–15.5)
WBC: 15.2 10*3/uL — ABNORMAL HIGH (ref 4.0–10.5)

## 2012-06-06 LAB — BASIC METABOLIC PANEL
BUN: 4 mg/dL — ABNORMAL LOW (ref 6–23)
Chloride: 108 mEq/L (ref 96–112)
Creatinine, Ser: 0.83 mg/dL (ref 0.50–1.10)
GFR calc Af Amer: 82 mL/min — ABNORMAL LOW (ref >90)
GFR calc non Af Amer: 71 mL/min — ABNORMAL LOW (ref >90)
Potassium: 2.6 mEq/L — CL (ref 3.5–5.1)

## 2012-06-06 LAB — PHOSPHORUS: Phosphorus: 2.6 mg/dL (ref 2.3–4.6)

## 2012-06-06 LAB — MAGNESIUM: Magnesium: 1.8 mg/dL (ref 1.5–2.5)

## 2012-06-06 MED ORDER — POTASSIUM CHLORIDE CRYS ER 20 MEQ PO TBCR: 40.0000 meq | EXTENDED_RELEASE_TABLET | Freq: Three times a day (TID) | ORAL | Status: DC

## 2012-06-06 MED ORDER — SALINE SPRAY 0.65 % NA SOLN
1.0000 | NASAL | Status: DC | PRN
  Filled 2012-06-06: qty 44

## 2012-06-06 MED ORDER — POTASSIUM CHLORIDE CRYS ER 20 MEQ PO TBCR
40.0000 meq | EXTENDED_RELEASE_TABLET | Freq: Three times a day (TID) | ORAL | Status: AC
  Administered 2012-06-06 (×3): 40 meq via ORAL
  Filled 2012-06-06 (×3): qty 2

## 2012-06-06 MED ORDER — LISINOPRIL-HYDROCHLOROTHIAZIDE 20-25 MG PO TABS: 1.0000 | ORAL_TABLET | Freq: Every day | ORAL | Status: DC

## 2012-06-06 MED ORDER — HYDROCHLOROTHIAZIDE 25 MG PO TABS
25.0000 mg | ORAL_TABLET | Freq: Every day | ORAL | Status: DC
  Administered 2012-06-07 – 2012-06-11 (×5): 25 mg via ORAL
  Filled 2012-06-06 (×5): qty 1

## 2012-06-06 MED ORDER — LISINOPRIL 20 MG PO TABS
20.0000 mg | ORAL_TABLET | Freq: Every day | ORAL | Status: DC
  Administered 2012-06-07 – 2012-06-11 (×5): 20 mg via ORAL
  Filled 2012-06-06 (×5): qty 1

## 2012-06-06 MED ORDER — GUAIFENESIN ER 600 MG PO TB12
600.0000 mg | ORAL_TABLET | Freq: Two times a day (BID) | ORAL | Status: DC
  Administered 2012-06-06 – 2012-06-11 (×6): 600 mg via ORAL
  Filled 2012-06-06 (×11): qty 1

## 2012-06-06 MED ORDER — DILTIAZEM HCL ER 240 MG PO CP24
240.0000 mg | ORAL_CAPSULE | Freq: Every day | ORAL | Status: DC
  Administered 2012-06-07 – 2012-06-09 (×3): 240 mg via ORAL
  Filled 2012-06-06 (×3): qty 1

## 2012-06-06 NOTE — Progress Notes (Signed)
[facility] PROGRESS NOTE  Kimberly Sellers RUE:[id number] DOB: 1946/01/03 DOA: 06/01/2012 PCP: [name], MD  Assessment/Plan: 1. Acute abdominal pain, presumed ileus, possible gastroenteritis, sepsis on admission: Presented with abdominal pain, nausea, vomiting, severe lactic acidosis. CT on admission demonstrated ileus, loculated paracolic gutter of unclear significance, mesenteric free fluid consider inflammatory process. Was started empirically on Zosyn and Flagyl. Sepsis/lactic acidosis resolved with IV fluids. General surgery signed off and recommended no intervention. Leukocytosis improved but not resolved. Microbiology unremarkable. Significance of persistent leukocytosis is unclear. Repeat imaging, labs in the morning. 2. Status post mixed metabolic alkalosis and AG metabolic acidosis: Resolved. Thought to be secondary to vomiting and lactate. 3. Hypokalemia: Replete. 4. SVT: Occurred in context of acute illness, sepsis. Review of the record does not demonstrate any evidence of atrial fibrillation. Anticoagulation not indicated. Resume long-acting diltiazem. EKGs as below. Continue Cardizem. TSH normal. Echocardiogram unremarkable. EKG 1/17: Sinus tachycardia EKG 1/19: Supraventricular tachycardia 5. Adrenal nodule: Consider outpatient evaluation.  6. Hypertension: Poor control. Resume lisinopril, hydrochlorothiazide in addition to long-acting diltiazem.  Discontinue central line today. Will place a peripheral line.  Code Status: Full Family Communication: None present Disposition Plan: Consult physical therapy  Kimberly Sacks, MD  [facility] Team 5 Pager [phone] If 7PM-7AM, please contact night-coverage at [email or web address], password [facility] 06/06/2012, 2:13 PM  LOS: 5 days   Brief narrative: 67 y/o woman with PMH only significant for HTN who presents with a 24 hour history of subjective fevers, chills, diffuse abdominal pain, n/v, body aches. She continues to have  intractable n/v despite treatment in the ED and we have been asked to admit her for further treatment. CT scan in the ED was remarkable for ileus.   Early hospitalization developed supraventricular tachycardia, lactic acid 8.1, white blood cell count up to 33. Transferred to ICU for increasing tachycardia, tachypnea and lactic acidosis. NGT was placed with 3 L of feculent material obtained shortly after placement. Sudden SVT while ambulating 1/19. Subsequently improved. Transferred to the hospitalist service.  Consultants:  General surgery  Critical care medicine  Procedures:  2-D echocardiogram: Left ventricle: The cavity size was normal. There was mild concentric hypertrophy. Systolic function was normal. The estimated ejection fraction was in the range of 50% to 55%. Wall motion was normal; there were no regional wall motion abnormalities. There was an increased relative contribution of atrial contraction to ventricular filling. Doppler parameters are consistent with abnormal left ventricular relaxation (grade 1 diastolic dysfunction).  Antibiotics:  Zosyn 1/17 >>  Vancomycin 1/18 >>  HPI/Subjective: Afebrile, vital signs stable. Tolerating liquids. No vomiting. Loose stools continued. No abdominal pain.  Objective: Filed Vitals:   06/05/12 0510 06/05/12 1357 06/05/12 2119 06/06/12 0631  BP: 159/72 141/71 149/111 154/82  Pulse: 79 66 86 87  Temp: 98.6 F (37 C) 99.3 F (37.4 C) 98.9 F (37.2 C) 98.8 F (37.1 C)  TempSrc:  Oral Oral   Resp: 18 18 19 20   Height:      Weight:      SpO2: 98% 99% 96% 100%    Intake/Output Summary (Last 24 hours) at 06/06/12 1413 Last data filed at 06/06/12 1230  Gross per 24 hour  Intake   1182 ml  Output   2302 ml  Net  -1120 ml   Filed Weights   06/01/12 1648  Weight: 108.8 kg (239 lb 13.8 oz)    Exam:  General:  Appears calm and comfortable, sitting in chair Cardiovascular: RRR, no m/r/g. No LE edema. Respiratory: CTA  bilaterally, no w/r/r. Normal respiratory effort. Abdomen: soft, nt, question mildly distended Psychiatric: grossly normal mood and affect, speech fluent and appropriate Neurologic: grossly non-focal.  Data Reviewed: Basic Metabolic Panel:  Lab 06/06/12 7253 06/05/12 0607 06/04/12 0445 06/03/12 0547 06/02/12 0550 06/01/12 2155  NA 144 -- 142 144 142 138  K 2.6* -- 3.3* 3.0* 3.5 3.4*  CL 108 -- 110 111 109 100  CO2 25 -- 23 21 19  18*  GLUCOSE 96 -- 87 156* 146* 203*  BUN 4* -- 12 21 24* 17  CREATININE 0.83 -- 0.70 0.71 1.01 0.79  CALCIUM 8.4 -- 8.2* 8.3* 8.3* 10.0  MG 1.8 1.9 2.0 1.9 -- --  PHOS 2.6 2.2* 1.5* 1.6* -- --   Liver Function Tests:  Lab 06/03/12 0547 06/02/12 0550 06/01/12 1255  AST 13 15 12   ALT 8 10 8   ALKPHOS 56 64 91  BILITOT 0.3 0.6 0.3  PROT 5.9* 5.9* 8.0  ALBUMIN 2.4* 2.4* 3.5    Lab 06/01/12 1255  LIPASE 20  AMYLASE --   CBC:  Lab 06/06/12 0618 06/04/12 0445 06/03/12 0547 06/02/12 0550 06/01/12 1723 06/01/12 1119  WBC 15.2* 18.2* 13.1* 17.6* 33.8* --  NEUTROABS -- -- -- 14.9* -- 18.0*  HGB 11.2* 10.8* 12.5 14.0 15.7* --  HCT 34.9* 33.0* 38.1 41.9 47.5* --  MCV 73.8* 74.2* 74.0* 73.8* 75.6* --  PLT 378 340 396 413* 532* --   Cardiac Enzymes:  Lab 06/03/12 0546 06/02/12 1142  CKTOTAL -- --  CKMB -- --  CKMBINDEX -- --  TROPONINI <0.30 <0.30     Recent Results (from the past 240 hour(s))  CULTURE, BLOOD (ROUTINE X 2)     Status: Normal (Preliminary result)   Collection Time   06/02/12 11:42 AM      Component Value Range Status Comment   Specimen Description BLOOD RIGHT ARM   Final    Special Requests BOTTLES DRAWN AEROBIC AND ANAEROBIC 10CC   Final    Culture  Setup Time 06/02/2012 20:53   Final    Culture     Final    Value:        BLOOD CULTURE RECEIVED NO GROWTH TO DATE CULTURE WILL BE HELD FOR 5 DAYS BEFORE ISSUING A FINAL NEGATIVE REPORT   Report Status PENDING   Incomplete   CULTURE, BLOOD (ROUTINE X 2)     Status: Normal  (Preliminary result)   Collection Time   06/02/12 11:48 AM      Component Value Range Status Comment   Specimen Description BLOOD RIGHT HAND   Final    Special Requests BOTTLES DRAWN AEROBIC ONLY 10CC   Final    Culture  Setup Time 06/02/2012 20:53   Final    Culture     Final    Value:        BLOOD CULTURE RECEIVED NO GROWTH TO DATE CULTURE WILL BE HELD FOR 5 DAYS BEFORE ISSUING A FINAL NEGATIVE REPORT   Report Status PENDING   Incomplete   CLOSTRIDIUM DIFFICILE BY PCR     Status: Normal   Collection Time   06/02/12  1:35 PM      Component Value Range Status Comment   C difficile by pcr NEGATIVE  NEGATIVE Final      Studies: No results found.  Scheduled Meds:   . aspirin EC  81 mg Oral Daily  . diltiazem  60 mg Oral Q6H  . feeding supplement  1 Container Oral TID BM  .  heparin subcutaneous  5,000 Units Subcutaneous Q8H  . loperamide  2 mg Oral BID  . pantoprazole (PROTONIX) IV  40 mg Intravenous Q24H  . piperacillin-tazobactam (ZOSYN)  IV  3.375 g Intravenous Q8H  . potassium chloride  40 mEq Oral TID  . vancomycin  1,000 mg Intravenous Q12H   Continuous Infusions:   . sodium chloride 75 mL/hr at 06/06/12 0003    Principal Problem:  *Abdominal pain Active Problems:  Nausea & vomiting  Body aches  Flu-like symptoms  Ileus  HTN (hypertension)  Adrenal nodule  Lactic acidosis  New onset a-fib  Sepsis     Kimberly Sacks, MD  [facility] Team 5 Pager [phone] If 7PM-7AM, please contact night-coverage at [email or web address], password [facility] 06/06/2012, 2:13 PM  LOS: 5 days   Time spent: 35 minutes

## 2012-06-06 NOTE — Progress Notes (Signed)
CRITICAL VALUE ALERT  Critical value received: K+=2.6  Date of notification:06/06/12  Time of notification:0727  Critical value read back:yes  Nurse who received alert: Chuck Hint  MD notified (1st page):  Irene Limbo  Time of first page:0730  MD notified (2nd page):  Time of second page:  Responding MD: Irene Limbo  Time MD responded: [phone]

## 2012-06-07 ENCOUNTER — Inpatient Hospital Stay (HOSPITAL_COMMUNITY): Payer: Medicare Other

## 2012-06-07 DIAGNOSIS — K56609 Unspecified intestinal obstruction, unspecified as to partial versus complete obstruction: Secondary | ICD-10-CM

## 2012-06-07 DIAGNOSIS — K56600 Partial intestinal obstruction, unspecified as to cause: Secondary | ICD-10-CM

## 2012-06-07 LAB — CBC
Hemoglobin: 11.2 g/dL — ABNORMAL LOW (ref 12.0–15.0)
MCH: 24.2 pg — ABNORMAL LOW (ref 26.0–34.0)
MCV: 74.1 fL — ABNORMAL LOW (ref 78.0–100.0)
RBC: 4.63 MIL/uL (ref 3.87–5.11)
WBC: 16.6 10*3/uL — ABNORMAL HIGH (ref 4.0–10.5)

## 2012-06-07 LAB — PHOSPHORUS: Phosphorus: 2.7 mg/dL (ref 2.3–4.6)

## 2012-06-07 LAB — BASIC METABOLIC PANEL
CO2: 29 mEq/L (ref 19–32)
Calcium: 8.8 mg/dL (ref 8.4–10.5)
Chloride: 109 mEq/L (ref 96–112)
Creatinine, Ser: 0.93 mg/dL (ref 0.50–1.10)
Glucose, Bld: 87 mg/dL (ref 70–99)

## 2012-06-07 NOTE — Progress Notes (Signed)
[facility] PROGRESS NOTE  Kimberly Sellers ZOX:[id number] DOB: 1945-10-31 DOA: 06/01/2012 PCP: [name], MD  Assessment/Plan: Presented with abdominal pain, nausea, vomiting, severe lactic acidosis. CT on admission demonstrated ileus, loculated paracolic gutter of unclear significance, mesenteric free fluid consider inflammatory process. Was started empirically on Zosyn and Flagyl. Sepsis/lactic acidosis resolved with IV fluids. General surgery signed off and recommended no intervention. Leukocytosis improved but not resolved. Microbiology unremarkable.  1. Acute abdominal pain, presumed ileus, possible gastroenteritis, sepsis on admission: Repeat imaging suggests partial small bowel obstruction worsening. Make n.p.o. Surgery consult requested. Consider repeat CT pending surgical recommendations.  Significance of persistent leukocytosis is unclear. Continue antibiotics. 2. Status post mixed metabolic alkalosis and AG metabolic acidosis: Resolved. Thought to be secondary to vomiting and lactate. 3. Hypokalemia: Repleted. 4. SVT: Occurred in context of acute illness, sepsis. Review of the record does not demonstrate any evidence of atrial fibrillation. Anticoagulation not indicated. Resume long-acting diltiazem. EKGs as below. Continue Cardizem. TSH normal. Echocardiogram unremarkable. EKG 1/17: Sinus tachycardia EKG 1/19: Supraventricular tachycardia 5. Adrenal nodule: Consider outpatient evaluation.  6. Hypertension: Some improvement. Resumed medications  Code Status: Full Family Communication: None present unable to reach daughter and niece telephone numbers provided Disposition Plan: Consult physical therapy  [name], MD  [facility] Team 5 Pager [phone] If 7PM-7AM, please contact night-coverage at [email or web address], password [facility] 06/07/2012, 9:59 AM  LOS: 6 days   Brief narrative: 67 y/o woman with PMH only significant for HTN who presents with a 24 hour history of  subjective fevers, chills, diffuse abdominal pain, n/v, body aches. She continues to have intractable n/v despite treatment in the ED and we have been asked to admit her for further treatment. CT scan in the ED was remarkable for ileus.   Early in hospitalization developed supraventricular tachycardia, lactic acid 8.1, white blood cell count up to 33. Transferred to ICU for increasing tachycardia, tachypnea and lactic acidosis. NGT was placed with 3 L of feculent material obtained shortly after placement. Sudden SVT while ambulating 1/19. Subsequently improved. Transferred to the hospitalist service.  Consultants:  General surgery  Critical care medicine  Procedures:  2-D echocardiogram: Left ventricle: The cavity size was normal. There was mild concentric hypertrophy. Systolic function was normal. The estimated ejection fraction was in the range of 50% to 55%. Wall motion was normal; there were no regional wall motion abnormalities. There was an increased relative contribution of atrial contraction to ventricular filling. Doppler parameters are consistent with abnormal left ventricular relaxation (grade 1 diastolic dysfunction).  Antibiotics:  Zosyn 1/17 >>  Vancomycin 1/18 >>  HPI/Subjective: Afebrile, vital signs stable. Tolerating liquids. Frequent small liquid BM, some nausea, no vomiting. No real abdominal pain.  Objective: Filed Vitals:   06/06/12 0631 06/06/12 1445 06/06/12 2137 06/07/12 0649  BP: 154/82 156/82 151/66 141/62  Pulse: 87 88 76 77  Temp: 98.8 F (37.1 C) 98.6 F (37 C) 98.9 F (37.2 C) 99 F (37.2 C)  TempSrc:  Oral Oral Oral  Resp: 20 20 18 18   Height:      Weight:      SpO2: 100% 100% 100% 94%    Intake/Output Summary (Last 24 hours) at 06/07/12 0959 Last data filed at 06/07/12 0700  Gross per 24 hour  Intake    700 ml  Output    801 ml  Net   -101 ml   Filed Weights   06/01/12 1648  Weight: 108.8 kg (239 lb 13.8 oz)     Exam:  General:  Appears calm and comfortable Cardiovascular: RRR, no m/r/g. No LE edema. Respiratory: CTA bilaterally, no w/r/r. Normal respiratory effort. Abdomen: soft, nt, appears distended, positive bowel sounds Psychiatric: grossly normal mood and affect, speech fluent and appropriate Neurologic: grossly non-focal.  Data Reviewed: Basic Metabolic Panel:  Lab 06/07/12 1610 06/06/12 0618 06/05/12 0607 06/04/12 0445 06/03/12 0547 06/02/12 0550  NA 145 144 -- 142 144 142  K 3.6 2.6* -- 3.3* 3.0* 3.5  CL 109 108 -- 110 111 109  CO2 29 25 -- 23 21 19   GLUCOSE 87 96 -- 87 156* 146*  BUN 3* 4* -- 12 21 24*  CREATININE 0.93 0.83 -- 0.70 0.71 1.01  CALCIUM 8.8 8.4 -- 8.2* 8.3* 8.3*  MG 1.9 1.8 1.9 2.0 1.9 --  PHOS 2.7 2.6 2.2* 1.5* 1.6* --   Liver Function Tests:  Lab 06/03/12 0547 06/02/12 0550 06/01/12 1255  AST 13 15 12   ALT 8 10 8   ALKPHOS 56 64 91  BILITOT 0.3 0.6 0.3  PROT 5.9* 5.9* 8.0  ALBUMIN 2.4* 2.4* 3.5    Lab 06/01/12 1255  LIPASE 20  AMYLASE --   CBC:  Lab 06/07/12 0555 06/06/12 0618 06/04/12 0445 06/03/12 0547 06/02/12 0550 06/01/12 1119  WBC 16.6* 15.2* 18.2* 13.1* 17.6* --  NEUTROABS -- -- -- -- 14.9* 18.0*  HGB 11.2* 11.2* 10.8* 12.5 14.0 --  HCT 34.3* 34.9* 33.0* 38.1 41.9 --  MCV 74.1* 73.8* 74.2* 74.0* 73.8* --  PLT 388 378 340 396 413* --   Cardiac Enzymes:  Lab 06/03/12 0546 06/02/12 1142  CKTOTAL -- --  CKMB -- --  CKMBINDEX -- --  TROPONINI <0.30 <0.30     Recent Results (from the past 240 hour(s))  CULTURE, BLOOD (ROUTINE X 2)     Status: Normal (Preliminary result)   Collection Time   06/02/12 11:42 AM      Component Value Range Status Comment   Specimen Description BLOOD RIGHT ARM   Final    Special Requests BOTTLES DRAWN AEROBIC AND ANAEROBIC 10CC   Final    Culture  Setup Time 06/02/2012 20:53   Final    Culture     Final    Value:        BLOOD CULTURE RECEIVED NO GROWTH TO DATE CULTURE WILL BE HELD FOR 5 DAYS BEFORE  ISSUING A FINAL NEGATIVE REPORT   Report Status PENDING   Incomplete   CULTURE, BLOOD (ROUTINE X 2)     Status: Normal (Preliminary result)   Collection Time   06/02/12 11:48 AM      Component Value Range Status Comment   Specimen Description BLOOD RIGHT HAND   Final    Special Requests BOTTLES DRAWN AEROBIC ONLY 10CC   Final    Culture  Setup Time 06/02/2012 20:53   Final    Culture     Final    Value:        BLOOD CULTURE RECEIVED NO GROWTH TO DATE CULTURE WILL BE HELD FOR 5 DAYS BEFORE ISSUING A FINAL NEGATIVE REPORT   Report Status PENDING   Incomplete   CLOSTRIDIUM DIFFICILE BY PCR     Status: Normal   Collection Time   06/02/12  1:35 PM      Component Value Range Status Comment   C difficile by pcr NEGATIVE  NEGATIVE Final      Studies: Dg Abd 2 Views  06/07/2012  *RADIOLOGY REPORT*  Clinical Data: Abdominal distention, follow up of ileus  ABDOMEN - 2 VIEW  Comparison: Abdomen films of 06/02/2012  Findings: There are more dilated loops of small bowel measuring up to 4.8 cm in diameter, with differential air-fluid levels consistent with increase in partial small bowel obstruction.  A small amount of colonic bowel gas is seen.  No free air is noted.  IMPRESSION: Some worsening of small bowel obstruction.  No free air.   Original Report Authenticated By: Dwyane Dee, M.D.     Scheduled Meds:    . aspirin EC  81 mg Oral Daily  . diltiazem  240 mg Oral Daily  . feeding supplement  1 Container Oral TID BM  . guaiFENesin  600 mg Oral BID  . heparin subcutaneous  5,000 Units Subcutaneous Q8H  . lisinopril  20 mg Oral Daily   And  . hydrochlorothiazide  25 mg Oral Daily  . loperamide  2 mg Oral BID  . pantoprazole (PROTONIX) IV  40 mg Intravenous Q24H  . piperacillin-tazobactam (ZOSYN)  IV  3.375 g Intravenous Q8H  . vancomycin  1,000 mg Intravenous Q12H   Continuous Infusions:   Principal Problem:  *Abdominal pain Active Problems:  Nausea & vomiting  Body aches  Flu-like  symptoms  Ileus  HTN (hypertension)  Adrenal nodule  Lactic acidosis  Sepsis     [name], MD  [facility] Team 5 Pager [phone] If 7PM-7AM, please contact night-coverage at [email or web address], password [facility] 06/07/2012, 9:59 AM  LOS: 6 days   Time spent: 25 minutes

## 2012-06-07 NOTE — Progress Notes (Signed)
Patient seen and examined.  Clinically, she does not have a small bowel obstruction despite what the x-ray suggests.  She has a small reducible ventral hernia in the periumbilical region.  Leukocytosis is noted but source is unclear.  Will start her on a full liquid diet and see how she does.  May need a repeat CT if leukocytosis persists.

## 2012-06-07 NOTE — Progress Notes (Signed)
ANTIBIOTIC CONSULT NOTE - FOLLOW UP  Pharmacy Consult for vancomycin Indication: r/o infection  Labs:  [facility] 06/07/12 0555 06/06/12 0618  WBC 16.6* 15.2*  HGB 11.2* 11.2*  PLT 388 378  LABCREA -- --  CREATININE 0.93 0.83   Estimated Creatinine Clearance: 71 ml/min (by C-G formula based on Cr of 0.93).  Basename 06/07/12 0555  VANCOTROUGH 16.6  VANCOPEAK --  VANCORANDOM --  GENTTROUGH --  GENTPEAK --  GENTRANDOM --  TOBRATROUGH --  TOBRAPEAK --  TOBRARND --  AMIKACINPEAK --  AMIKACINTROU --  AMIKACIN --     Assessment/Plan:  67yo female therapeutic on vancomycin with initial dosing.  Will continue at current dose and monitor levels, ?LOT.  Colleen Can PharmD BCPS 06/07/2012,6:48 AM

## 2012-06-07 NOTE — Progress Notes (Signed)
NUTRITION FOLLOW UP  Intervention:   1. Discontinue Resource Breeze 2. Diet advancement per MD 3. RD to continue to follow nutrition care plan  Nutrition Dx:   Inadequate oral intake now related to nausea as evidenced by pt report and poor po intake. Improving.  Goal:   Patient to meet >/=90% of estimated nutrition needs. Unmet.  Monitor:   Weight trends, labs, I/Os, PO intake, nausea    Assessment:  Continues with persistent leukocytosis at this time. Advanced to full liquids on 1/20. Tolerating liquids, having small liquid BM with nausea. Pt able to consume 100% of liquid trays. Continues with order for Resource Breeze PO TID; pt is refusing. Repeat imaging suggest partial small bowel obstruction worsening. Subsequently downgraded to NPO. Surgery re-evaluated pt with recommendations for CT scan to re-evaluate fluid collections. They note pt should remain NPO until decide on CT.  Critical value of low potassium on 1/22. Currently repleted and now WNL.   Height: Ht Readings from Last 1 Encounters:  06/01/12 5\' 5"  (1.651 m)    Weight Status:   Wt Readings from Last 1 Encounters:  06/01/12 239 lb 13.8 oz (108.8 kg)  No new weight information at this time.   Re-estimated needs:  Kcal: 1950 - 2100 kcal  Protein: 110 - 120 g daily  Fluid: 1.9 - 2 L   Skin: Intact   Diet Order: NPO   Intake/Output Summary (Last 24 hours) at 06/07/12 1127 Last data filed at 06/07/12 1100  Gross per 24 hour  Intake    700 ml  Output   1252 ml  Net   -552 ml    Last BM: 1//22   Labs:   Lab 06/07/12 0555 06/06/12 0618 06/05/12 0607 06/04/12 0445  NA 145 144 -- 142  K 3.6 2.6* -- 3.3*  CL 109 108 -- 110  CO2 29 25 -- 23  BUN 3* 4* -- 12  CREATININE 0.93 0.83 -- 0.70  CALCIUM 8.8 8.4 -- 8.2*  MG 1.9 1.8 1.9 --  PHOS 2.7 2.6 2.2* --  GLUCOSE 87 96 -- 87    CBG (last 3)  No results found for this basename: GLUCAP:3 in the last 72 hours  Scheduled Meds:    . aspirin EC  81  mg Oral Daily  . diltiazem  240 mg Oral Daily  . feeding supplement  1 Container Oral TID BM  . guaiFENesin  600 mg Oral BID  . heparin subcutaneous  5,000 Units Subcutaneous Q8H  . lisinopril  20 mg Oral Daily   And  . hydrochlorothiazide  25 mg Oral Daily  . loperamide  2 mg Oral BID  . pantoprazole (PROTONIX) IV  40 mg Intravenous Q24H  . piperacillin-tazobactam (ZOSYN)  IV  3.375 g Intravenous Q8H  . vancomycin  1,000 mg Intravenous Q12H    Continuous Infusions:  None at this time    Jarold Motto MS, RD, LDN Pager: [phone] After-hours pager: [phone]

## 2012-06-07 NOTE — Progress Notes (Signed)
Subjective: We are being re-consulted on this patient for "worsening bowel obstruction" on KUB.  The pt notes minimal pain which is not currently present in the LLQ.  She had some mild nausea which resolved and no N/V today.  Pt denies fever, chills, dysuria.  Pt had 3 BM's (1 large brown, 2 watery diarrhea) and is passing a lot of gas.  She is up ambulating through the halls without problems.  H/o lap chole, no other surgeries.  Objective: Vital signs in last 24 hours: Temp:  [98.6 F (37 C)-99 F (37.2 C)] 99 F (37.2 C) June 29, 2022 0649) Pulse Rate:  [76-88] 77  June 29, 2022 0649) Resp:  [18-20] 18  2022/06/29 0649) BP: (141-156)/(62-82) 141/62 mmHg 29-Jun-2022 0649) SpO2:  [94 %-100 %] 94 % Jun 29, 2022 0649) Last BM Date: 06/06/12  Intake/Output from previous day: 01/22 0701 - 06-29-2022 0700 In: 700 [P.O.:700] Out: 801 [Urine:800; Stool:1] Intake/Output this shift: Total I/O In: -  Out: 451 [Urine:450; Stool:1]  PE: Gen:  Alert, NAD, pleasant Card:  RRR, no M/G/R heard Pulm:  CTA, no W/R/R Abd: Soft, NT, mild distension, +BS, no HSM, no peritoneal signs  Lab Results:   Basename 06-29-12 0555 06/06/12 0618  WBC 16.6* 15.2*  HGB 11.2* 11.2*  HCT 34.3* 34.9*  PLT 388 378   BMET  Basename June 29, 2012 0555 06/06/12 0618  NA 145 144  K 3.6 2.6*  CL 109 108  CO2 29 25  GLUCOSE 87 96  BUN 3* 4*  CREATININE 0.93 0.83  CALCIUM 8.8 8.4   PT/INR No results found for this basename: LABPROT:2,INR:2 in the last 72 hours CMP     Component Value Date/Time   NA 145 2012/06/29 0555   K 3.6 06-29-2012 0555   CL 109 06-29-2012 0555   CO2 29 06/29/12 0555   GLUCOSE 87 2012-06-29 0555   BUN 3* 2012/06/29 0555   CREATININE 0.93 06/29/2012 0555   CALCIUM 8.8 06-29-12 0555   PROT 5.9* 06/03/2012 0547   ALBUMIN 2.4* 06/03/2012 0547   AST 13 06/03/2012 0547   ALT 8 06/03/2012 0547   ALKPHOS 56 06/03/2012 0547   BILITOT 0.3 06/03/2012 0547   GFRNONAA 62* 06/29/2012 0555   GFRAA 72* 06/29/12 0555   Lipase     Component Value Date/Time   LIPASE 20 06/01/2012 1255       Studies/Results: Dg Abd 2 Views  2012/06/29  *RADIOLOGY REPORT*  Clinical Data: Abdominal distention, follow up of ileus  ABDOMEN - 2 VIEW  Comparison: Abdomen films of 06/02/2012  Findings: There are more dilated loops of small bowel measuring up to 4.8 cm in diameter, with differential air-fluid levels consistent with increase in partial small bowel obstruction.  A small amount of colonic bowel gas is seen.  No free air is noted.  IMPRESSION: Some worsening of small bowel obstruction.  No free air.   Original Report Authenticated By: Dwyane Dee, M.D.     Anti-infectives: Anti-infectives     Start     Dose/Rate Route Frequency Ordered Stop   06/02/12 1800   vancomycin (VANCOCIN) IVPB 1000 mg/200 mL premix        1,000 mg 200 mL/hr over 60 Minutes Intravenous Every 12 hours 06/02/12 0550     06/02/12 0600   metroNIDAZOLE (FLAGYL) IVPB 500 mg  Status:  Discontinued        500 mg 100 mL/hr over 60 Minutes Intravenous Every 8 hours 06/02/12 0547 06/04/12 1046   06/02/12 0600   vancomycin (VANCOCIN)  2,500 mg in sodium chloride 0.9 % 500 mL IVPB        2,500 mg 250 mL/hr over 120 Minutes Intravenous  Once 06/02/12 0550 06/02/12 1008   06/01/12 2300  piperacillin-tazobactam (ZOSYN) IVPB 3.375 g       3.375 g 12.5 mL/hr over 240 Minutes Intravenous Every 8 hours 06/01/12 2250     06/01/12 1730   oseltamivir (TAMIFLU) capsule 75 mg  Status:  Discontinued        75 mg Oral 2 times daily 06/01/12 1713 06/02/12 0337           Assessment/Plan pSBO worse on KUB, but clinically patient's pain is less, had 3 BM this am, passing gas and has not had food intolerance 1.  Given WBC may need to get a CT scan to re-eval fluid collections, will discuss with Dr. Abbey Chatters 2.  Cont Zosyn, may need to start invanz if fluid collections are more significant and may need IR consult 3.  Made NPO by primary, will continue this until we  decide on CT 4.  Not likely need surgery given her current presentation    LOS: 6 days    DORT, [facility] 06/07/2012, 11:37 AM Pager: [phone]

## 2012-06-08 ENCOUNTER — Inpatient Hospital Stay (HOSPITAL_COMMUNITY): Payer: Medicare Other

## 2012-06-08 LAB — CULTURE, BLOOD (ROUTINE X 2)

## 2012-06-08 LAB — BASIC METABOLIC PANEL
CO2: 28 mEq/L (ref 19–32)
Calcium: 8.7 mg/dL (ref 8.4–10.5)
Chloride: 104 mEq/L (ref 96–112)
Glucose, Bld: 101 mg/dL — ABNORMAL HIGH (ref 70–99)
Sodium: 143 mEq/L (ref 135–145)

## 2012-06-08 LAB — CBC
Hemoglobin: 10.8 g/dL — ABNORMAL LOW (ref 12.0–15.0)
MCH: 23.9 pg — ABNORMAL LOW (ref 26.0–34.0)
MCV: 74.9 fL — ABNORMAL LOW (ref 78.0–100.0)
Platelets: 389 10*3/uL (ref 150–400)
RBC: 4.51 MIL/uL (ref 3.87–5.11)
WBC: 14.9 10*3/uL — ABNORMAL HIGH (ref 4.0–10.5)

## 2012-06-08 LAB — PHOSPHORUS: Phosphorus: 3.5 mg/dL (ref 2.3–4.6)

## 2012-06-08 MED ORDER — IOHEXOL 300 MG/ML  SOLN
100.0000 mL | Freq: Once | INTRAMUSCULAR | Status: AC | PRN
  Administered 2012-06-08: 100 mL via INTRAVENOUS

## 2012-06-08 MED ORDER — POTASSIUM CHLORIDE CRYS ER 20 MEQ PO TBCR
40.0000 meq | EXTENDED_RELEASE_TABLET | Freq: Four times a day (QID) | ORAL | Status: AC
  Administered 2012-06-08 (×4): 40 meq via ORAL
  Filled 2012-06-08 (×4): qty 2

## 2012-06-08 MED ORDER — IOHEXOL 300 MG/ML  SOLN
25.0000 mL | INTRAMUSCULAR | Status: AC
  Administered 2012-06-08 (×2): 25 mL via ORAL

## 2012-06-08 NOTE — Progress Notes (Signed)
Patient seen and examined.  Having multiple loose stools.  Potassium 2.4.  Will repeat CT to look for a source of infection.

## 2012-06-08 NOTE — Progress Notes (Signed)
Subjective: Pt notes nausea and abdominal pain over night.  Pt states less bloating, still passing flatus, and only small loose BM's yesterday afternoon to today.  Pt ambulating frequently.  Pt not fully tolerating diet, but ate a small amt of BF this am.  Objective: Vital signs in last 24 hours: Temp:  [98.2 F (36.8 C)-99.1 F (37.3 C)] 99.1 F (37.3 C) (01/24 0611) Pulse Rate:  [71-83] 83  (01/24 0611) Resp:  [18-20] 18  (01/24 0611) BP: (135-141)/(62-65) 141/62 mmHg (01/24 0611) SpO2:  [97 %-100 %] 97 % (01/24 0611) Last BM Date: 2012/06/17  Intake/Output from previous day: 06-17-22 0701 - 01/24 0700 In: 480 [P.O.:480] Out: 701 [Urine:700; Stool:1] Intake/Output this shift:    PE: Gen:  Alert, NAD, pleasant Card:  RRR, no M/G/R heard Pulm:  CTA, no W/R/R Abd: Soft, mild distension, mild tenderness, +BS, no HSM  Lab Results:   [facility] 06/08/12 0620 06/17/12 0555  WBC 14.9* 16.6*  HGB 10.8* 11.2*  HCT 33.8* 34.3*  PLT 389 388   BMET  Basename 06/08/12 0620 June 17, 2012 0555  NA 143 145  K 2.4* 3.6  CL 104 109  CO2 28 29  GLUCOSE 101* 87  BUN <3* 3*  CREATININE 0.93 0.93  CALCIUM 8.7 8.8   PT/INR No results found for this basename: LABPROT:2,INR:2 in the last 72 hours CMP     Component Value Date/Time   NA 143 06/08/2012 0620   K 2.4* 06/08/2012 0620   CL 104 06/08/2012 0620   CO2 28 06/08/2012 0620   GLUCOSE 101* 06/08/2012 0620   BUN <3* 06/08/2012 0620   CREATININE 0.93 06/08/2012 0620   CALCIUM 8.7 06/08/2012 0620   PROT 5.9* 06/03/2012 0547   ALBUMIN 2.4* 06/03/2012 0547   AST 13 06/03/2012 0547   ALT 8 06/03/2012 0547   ALKPHOS 56 06/03/2012 0547   BILITOT 0.3 06/03/2012 0547   GFRNONAA 62* 06/08/2012 0620   GFRAA 72* 06/08/2012 0620   Lipase     Component Value Date/Time   LIPASE 20 06/01/2012 1255       Studies/Results: Dg Abd 2 Views  June 17, 2012  *RADIOLOGY REPORT*  Clinical Data: Abdominal distention, follow up of ileus  ABDOMEN - 2 VIEW   Comparison: Abdomen films of 06/02/2012  Findings: There are more dilated loops of small bowel measuring up to 4.8 cm in diameter, with differential air-fluid levels consistent with increase in partial small bowel obstruction.  A small amount of colonic bowel gas is seen.  No free air is noted.  IMPRESSION: Some worsening of small bowel obstruction.  No free air.   Original Report Authenticated By: Dwyane Dee, M.D.     Anti-infectives: Anti-infectives     Start     Dose/Rate Route Frequency Ordered Stop   06/02/12 1800   vancomycin (VANCOCIN) IVPB 1000 mg/200 mL premix        1,000 mg 200 mL/hr over 60 Minutes Intravenous Every 12 hours 06/02/12 0550     06/02/12 0600   metroNIDAZOLE (FLAGYL) IVPB 500 mg  Status:  Discontinued        500 mg 100 mL/hr over 60 Minutes Intravenous Every 8 hours 06/02/12 0547 06/04/12 1046   06/02/12 0600   vancomycin (VANCOCIN) 2,500 mg in sodium chloride 0.9 % 500 mL IVPB        2,500 mg 250 mL/hr over 120 Minutes Intravenous  Once 06/02/12 0550 06/02/12 1008   06/01/12 2300  piperacillin-tazobactam (ZOSYN) IVPB 3.375 g  3.375 g 12.5 mL/hr over 240 Minutes Intravenous Every 8 hours 06/01/12 2250     06/01/12 1730   oseltamivir (TAMIFLU) capsule 75 mg  Status:  Discontinued        75 mg Oral 2 times daily 06/01/12 1713 06/02/12 0337           Assessment/Plan pSBO, ileus, vs possible abscess worse on SBO worse on KUB yesterday, clinically today slightly worse overnight 1. Will get CT scan to re-eval fluid collections 2. Cont Zosyn for now, may need to start invanz if fluid collections are more significant and may need IR consult  3. Will await repeat CT for results and disposition and plan  Leukocytosis - slightly improved today, but still elevated Hypokalemia - being supplemented    LOS: 7 days    Kimberly Sellers, Kimberly Sellers 06/08/2012, 9:43 AM Pager: [phone]

## 2012-06-08 NOTE — Progress Notes (Signed)
[facility] PROGRESS NOTE  Aylana Hirschfeld ZOX:[id number] DOB: March 01, 1946 DOA: 06/01/2012 PCP: [name], MD  Assessment/Plan: 1. Acute abdominal pain, presumed ileus, possible gastroenteritis, sepsis on admission: Overall seems to be clinically improved. Surgery consult appreciated. Consider repeat CT pending surgical recommendations.  Significance of persistent leukocytosis is unclear. 7 days of antibiotics today. Discontinue antibiotics and monitor. 2. Leukocytosis: Further review the chart raises the suspicion of a chronic process. Leukocytosis was noted 04/2009, 01/2009, 04/2007, ranging from 12.7-14.0. Furthermore an ultrasound was ordered in 2002 for leukocytosis. Will try to obtain office notes from Dr. Gita Kudo office. Check a peripheral smear. I am not doubtful that leukocytosis represents infection. 3. Status post mixed metabolic alkalosis and AG metabolic acidosis: Resolved. Thought to be secondary to vomiting and lactate. 4. Hypokalemia: Replete. Magnesium normal. 5. SVT: Occurred in context of acute illness, sepsis. Review of the record does not demonstrate any evidence of atrial fibrillation. Anticoagulation not indicated. Continue long-acting diltiazem. EKGs as below. Continue Cardizem. TSH normal. Echocardiogram unremarkable. EKG 1/17: Sinus tachycardia EKG 1/19: Supraventricular tachycardia. Note SVT also seen 05/2002 per cardiology note. 6. Adrenal nodule: Consider outpatient evaluation.  7. Hypertension: Some improvement. Resumed medications  History of bullous pemphigoid.  05/2000 leukocytosis noted. Ultrasound performed to assess for splenomegaly at that time. No splenomegaly.  Leukocytosis noted 04/2009, 01/2009, 04/2007 ranging from 12.7 and 14.0.  Code Status: Full Family Communication: None present unable to reach daughter and niece telephone numbers provided Disposition Plan: Consult physical therapy  [name], MD  [facility] Team 5 Pager  [phone] If 7PM-7AM, please contact night-coverage at [email or web address], password [facility] 06/08/2012, 9:09 AM  LOS: 7 days   Brief narrative: 67 y/o woman with PMH only significant for HTN who presents with a 24 hour history of subjective fevers, chills, diffuse abdominal pain, n/v, body aches. She continues to have intractable n/v despite treatment in the ED and we have been asked to admit her for further treatment. CT scan in the ED was remarkable for ileus.   Early in hospitalization developed supraventricular tachycardia, lactic acid 8.1, white blood cell count up to 33. Transferred to ICU for increasing tachycardia, tachypnea and lactic acidosis. NGT was placed with 3 L of feculent material obtained shortly after placement. Sudden SVT while ambulating 1/19. Subsequently improved. Transferred to the hospitalist service.  Presented with abdominal pain, nausea, vomiting, severe lactic acidosis. CT on admission demonstrated ileus, loculated paracolic gutter of unclear significance, mesenteric free fluid consider inflammatory process. Was started empirically on Zosyn and Flagyl. Sepsis/lactic acidosis resolved with IV fluids. General surgery signed off and recommended no intervention. Leukocytosis improved but not resolved. Microbiology unremarkable.  Consultants:  General surgery  Critical care medicine  Procedures:  2-D echocardiogram: Left ventricle: The cavity size was normal. There was mild concentric hypertrophy. Systolic function was normal. The estimated ejection fraction was in the range of 50% to 55%. Wall motion was normal; there were no regional wall motion abnormalities. There was an increased relative contribution of atrial contraction to ventricular filling. Doppler parameters are consistent with abnormal left ventricular relaxation (grade 1 diastolic dysfunction).  Antibiotics:  Zosyn 1/17 >>  Vancomycin 1/18 >>  HPI/Subjective: Afebrile, vital signs stable. Overall little  better. No abdominal pain. Some nausea, no vomiting. Tolerating liquids. Loose stools continue.  Objective: Filed Vitals:   06/07/12 0649 06/07/12 1348 06/07/12 2142 06/08/12 0611  BP: 141/62 135/65 137/62 141/62  Pulse: 77 71 73 83  Temp: 99 F (37.2 C) 98.2 F (36.8 C) 98.6 F (37  C) 99.1 F (37.3 C)  TempSrc: Oral Oral Oral Oral  Resp: 18 20 20 18   Height:      Weight:      SpO2: 94% 100% 99% 97%    Intake/Output Summary (Last 24 hours) at 06/08/12 0909 Last data filed at 06/07/12 1700  Gross per 24 hour  Intake    480 ml  Output    701 ml  Net   -221 ml   Filed Weights   06/01/12 1648  Weight: 108.8 kg (239 lb 13.8 oz)    Exam:  General:  Appears calm and comfortable Cardiovascular: RRR, no m/r/g. No LE edema. Respiratory: CTA bilaterally, no w/r/r. Normal respiratory effort. Abdomen: soft, nt, nondistended Psychiatric: grossly normal mood and affect, speech fluent and appropriate  Data Reviewed: Basic Metabolic Panel:  Lab 06/08/12 0981 06/07/12 0555 06/06/12 0618 06/05/12 0607 06/04/12 0445 06/03/12 0547  NA 143 145 144 -- 142 144  K 2.4* 3.6 2.6* -- 3.3* 3.0*  CL 104 109 108 -- 110 111  CO2 28 29 25  -- 23 21  GLUCOSE 101* 87 96 -- 87 156*  BUN <3* 3* 4* -- 12 21  CREATININE 0.93 0.93 0.83 -- 0.70 0.71  CALCIUM 8.7 8.8 8.4 -- 8.2* 8.3*  MG 1.7 1.9 1.8 1.9 2.0 --  PHOS 3.5 2.7 2.6 2.2* 1.5* --   Liver Function Tests:  Lab 06/03/12 0547 06/02/12 0550 06/01/12 1255  AST 13 15 12   ALT 8 10 8   ALKPHOS 56 64 91  BILITOT 0.3 0.6 0.3  PROT 5.9* 5.9* 8.0  ALBUMIN 2.4* 2.4* 3.5    Lab 06/01/12 1255  LIPASE 20  AMYLASE --   CBC:  Lab 06/08/12 0620 06/07/12 0555 06/06/12 0618 06/04/12 0445 06/03/12 0547 06/02/12 0550 06/01/12 1119  WBC 14.9* 16.6* 15.2* 18.2* 13.1* -- --  NEUTROABS -- -- -- -- -- 14.9* 18.0*  HGB 10.8* 11.2* 11.2* 10.8* 12.5 -- --  HCT 33.8* 34.3* 34.9* 33.0* 38.1 -- --  MCV 74.9* 74.1* 73.8* 74.2* 74.0* -- --  PLT 389 388 378  340 396 -- --   Cardiac Enzymes:  Lab 06/03/12 0546 06/02/12 1142  CKTOTAL -- --  CKMB -- --  CKMBINDEX -- --  TROPONINI <0.30 <0.30     Recent Results (from the past 240 hour(s))  CULTURE, BLOOD (ROUTINE X 2)     Status: Normal   Collection Time   06/02/12 11:42 AM      Component Value Range Status Comment   Specimen Description BLOOD RIGHT ARM   Final    Special Requests BOTTLES DRAWN AEROBIC AND ANAEROBIC 10CC   Final    Culture  Setup Time 06/02/2012 20:53   Final    Culture NO GROWTH 5 DAYS   Final    Report Status 06/08/2012 FINAL   Final   CULTURE, BLOOD (ROUTINE X 2)     Status: Normal   Collection Time   06/02/12 11:48 AM      Component Value Range Status Comment   Specimen Description BLOOD RIGHT HAND   Final    Special Requests BOTTLES DRAWN AEROBIC ONLY 10CC   Final    Culture  Setup Time 06/02/2012 20:53   Final    Culture NO GROWTH 5 DAYS   Final    Report Status 06/08/2012 FINAL   Final   CLOSTRIDIUM DIFFICILE BY PCR     Status: Normal   Collection Time   06/02/12  1:35 PM  Component Value Range Status Comment   C difficile by pcr NEGATIVE  NEGATIVE Final      Studies: Dg Abd 2 Views  06/07/2012  *RADIOLOGY REPORT*  Clinical Data: Abdominal distention, follow up of ileus  ABDOMEN - 2 VIEW  Comparison: Abdomen films of 06/02/2012  Findings: There are more dilated loops of small bowel measuring up to 4.8 cm in diameter, with differential air-fluid levels consistent with increase in partial small bowel obstruction.  A small amount of colonic bowel gas is seen.  No free air is noted.  IMPRESSION: Some worsening of small bowel obstruction.  No free air.   Original Report Authenticated By: Dwyane Dee, M.D.     Scheduled Meds:    . aspirin EC  81 mg Oral Daily  . diltiazem  240 mg Oral Daily  . guaiFENesin  600 mg Oral BID  . heparin subcutaneous  5,000 Units Subcutaneous Q8H  . lisinopril  20 mg Oral Daily   And  . hydrochlorothiazide  25 mg Oral Daily   . loperamide  2 mg Oral BID  . pantoprazole (PROTONIX) IV  40 mg Intravenous Q24H  . piperacillin-tazobactam (ZOSYN)  IV  3.375 g Intravenous Q8H  . vancomycin  1,000 mg Intravenous Q12H   Continuous Infusions:   Principal Problem:  *Abdominal pain Active Problems:  Nausea & vomiting  Body aches  Flu-like symptoms  Ileus  HTN (hypertension)  Adrenal nodule  Lactic acidosis  Sepsis  Partial small bowel obstruction     [name], MD  [facility] Team 5 Pager [phone] If 7PM-7AM, please contact night-coverage at [email or web address], password [facility] 06/08/2012, 9:09 AM  LOS: 7 days   Time spent: 20 minutes

## 2012-06-08 NOTE — Progress Notes (Signed)
CRITICAL VALUE ALERT  Critical value received:  K+ - 2.4   Date of notification:  06/08/2012  Time of notification:  0904  Critical value read back:yes  Nurse who received alert:  Nickola Major  MD notified (1st page):  Dr. Irene Limbo  Time of first page:  0905  MD notified (2nd page):  Time of second page:  Responding MD:  Dr. Irene Limbo  Time MD responded:  [phone]

## 2012-06-09 DIAGNOSIS — E876 Hypokalemia: Secondary | ICD-10-CM

## 2012-06-09 LAB — CBC WITH DIFFERENTIAL/PLATELET
Basophils Absolute: 0 10*3/uL (ref 0.0–0.1)
Eosinophils Relative: 2 % (ref 0–5)
HCT: 33.5 % — ABNORMAL LOW (ref 36.0–46.0)
Hemoglobin: 10.8 g/dL — ABNORMAL LOW (ref 12.0–15.0)
Lymphocytes Relative: 13 % (ref 12–46)
Lymphs Abs: 2.1 10*3/uL (ref 0.7–4.0)
MCV: 74.8 fL — ABNORMAL LOW (ref 78.0–100.0)
Monocytes Absolute: 1.4 10*3/uL — ABNORMAL HIGH (ref 0.1–1.0)
Monocytes Relative: 9 % (ref 3–12)
Neutro Abs: 12.5 10*3/uL — ABNORMAL HIGH (ref 1.7–7.7)
RBC: 4.48 MIL/uL (ref 3.87–5.11)
RDW: 17.3 % — ABNORMAL HIGH (ref 11.5–15.5)
WBC: 16.2 10*3/uL — ABNORMAL HIGH (ref 4.0–10.5)

## 2012-06-09 LAB — BASIC METABOLIC PANEL
CO2: 30 mEq/L (ref 19–32)
Chloride: 105 mEq/L (ref 96–112)
Creatinine, Ser: 1.14 mg/dL — ABNORMAL HIGH (ref 0.50–1.10)
GFR calc Af Amer: 56 mL/min — ABNORMAL LOW (ref >90)
Potassium: 3.4 mEq/L — ABNORMAL LOW (ref 3.5–5.1)
Sodium: 145 mEq/L (ref 135–145)

## 2012-06-09 LAB — MAGNESIUM: Magnesium: 1.8 mg/dL (ref 1.5–2.5)

## 2012-06-09 MED ORDER — METOPROLOL TARTRATE 50 MG PO TABS
50.0000 mg | ORAL_TABLET | Freq: Two times a day (BID) | ORAL | Status: DC
  Administered 2012-06-10 – 2012-06-11 (×3): 50 mg via ORAL
  Filled 2012-06-09 (×4): qty 1

## 2012-06-09 MED ORDER — POTASSIUM CHLORIDE CRYS ER 20 MEQ PO TBCR
40.0000 meq | EXTENDED_RELEASE_TABLET | Freq: Two times a day (BID) | ORAL | Status: AC
  Administered 2012-06-09 – 2012-06-11 (×4): 40 meq via ORAL
  Filled 2012-06-09 (×4): qty 2

## 2012-06-09 NOTE — Progress Notes (Signed)
Subjective: Having bms, a little nausea , tol clears, ct yesterday  Objective: Vital signs in last 24 hours: Temp:  [98.9 F (37.2 C)-99 F (37.2 C)] 99 F (37.2 C) (01/25 0615) Pulse Rate:  [75-88] 88  (01/25 0615) Resp:  [16-17] 16  (01/25 0615) BP: (97-142)/(50-60) 142/50 mmHg (01/25 0615) SpO2:  [96 %-100 %] 96 % (01/25 0615) Last BM Date: 06/08/12  Intake/Output from previous day: 01/24 0701 - 01/25 0700 In: -  Out: 1 [Urine:1] Intake/Output this shift:    GI: obese nontender  Lab Results:   Basename 06/09/12 0552 06/08/12 0620  WBC 16.2* 14.9*  HGB 10.8* 10.8*  HCT 33.5* 33.8*  PLT 429* 389   BMET  Basename 06/09/12 0552 06/08/12 0620  NA 145 143  K 3.4* 2.4*  CL 105 104  CO2 30 28  GLUCOSE 94 101*  BUN 3* <3*  CREATININE 1.14* 0.93  CALCIUM 9.1 8.7    Studies/Results: Ct Abdomen Pelvis W Contrast  06/08/2012  *RADIOLOGY REPORT*  Clinical Data: Worsening abdominal pain and nausea.  Dilated bowel loops on KUB. Follow up right abdominal fluid collection on prior CT.  CT ABDOMEN AND PELVIS WITH CONTRAST  Technique:  Multidetector CT imaging of the abdomen and pelvis was performed following the standard protocol during bolus administration of intravenous contrast.  Contrast: 1 OMNIPAQUE IOHEXOL 300 MG/ML  SOLN, OMNIPAQUE IOHEXOL 300 MG/ML  SOLN  Comparison: 06/01/2012  Findings: There is persistent generalized dilatation of small bowel as well as gaseous distention of the colon with air-fluid levels seen throughout.  This is consistent with an ileus.  No transition point is seen.  Diverticulosis is again seen involving the descending and sigmoid colon, however there is no evidence of diverticulitis.  No focal inflammatory process is identified. Normal appendix is visualized.  A small amount of free fluid is now seen in the pelvic cul-de-sac which is nonspecific.  No definite abscess or other abnormal fluid collections are identified.  Prior hysterectomy  noted.  Adnexal regions are unremarkable in appearance.  Shotty lymph nodes are again seen within the small bowel mesentery, but no pathologically enlarged nodes are identified within the abdomen or pelvis.  No soft tissue masses are identified. Several renal cysts are again seen bilaterally however there is no evidence of renal mass or hydronephrosis.  The liver, spleen, and pancreas are normal appearance.  Prior cholecystectomy noted.  No evidence of biliary ductal dilatation.  A small to left adrenal soft tissue nodule measuring 1.4 cm is stable and likely represents a small adrenal adenoma.  IMPRESSION:  1.  Persistent adynamic ileus pattern, without significant change. 2.  Diverticulosis.  No radiographic evidence of diverticulitis. 3.  No focal inflammatory process or abscess identified.  Small amount of free fluid noted in the pelvic cul-de-sac. 4.  Stable small left adrenal nodule, likely a benign adenoma in the absence of history of malignancy.   Original Report Authenticated By: Myles Rosenthal, M.D.     Anti-infectives: Anti-infectives     Start     Dose/Rate Route Frequency Ordered Stop   06/02/12 1800   vancomycin (VANCOCIN) IVPB 1000 mg/200 mL premix  Status:  Discontinued        1,000 mg 200 mL/hr over 60 Minutes Intravenous Every 12 hours 06/02/12 0550 06/08/12 0948   06/02/12 0600   metroNIDAZOLE (FLAGYL) IVPB 500 mg  Status:  Discontinued        500 mg 100 mL/hr over 60 Minutes Intravenous Every 8 hours  06/02/12 0547 06/04/12 1046   06/02/12 0600   vancomycin (VANCOCIN) 2,500 mg in sodium chloride 0.9 % 500 mL IVPB        2,500 mg 250 mL/hr over 120 Minutes Intravenous  Once 06/02/12 0550 06/02/12 1008   06/01/12 2300   piperacillin-tazobactam (ZOSYN) IVPB 3.375 g  Status:  Discontinued        3.375 g 12.5 mL/hr over 240 Minutes Intravenous Every 8 hours 06/01/12 2250 06/08/12 0948   06/01/12 1730   oseltamivir (TAMIFLU) capsule 75 mg  Status:  Discontinued        75 mg Oral 2  times daily 06/01/12 1713 06/02/12 1610          Assessment/Plan: Ab pain, n/v/d  CT yesterday with ileus pattern, she is clinically not obstructed, no evidence of any surgical process, abdomen nontender, wbc up more unclear where.   [facility] 06/09/2012

## 2012-06-09 NOTE — Progress Notes (Signed)
[facility] PROGRESS NOTE  Lexandra Rettke ZOX:[id number] DOB: March 05, 1946 DOA: 06/01/2012 PCP: [name], MD  Assessment/Plan: 1. Ileus, sepsis on admission: CT revealed persistent ileus, etiology unclear. Aggressive potassium repletion. Given persistent ileus will discontinue Diltiazem although this is a chronic medication and replace with metoprolol. Surgery consult appreciated. Completed antibiotics yesterday, no fever. Doubt leukocytosis suggestive of infection. 2. Leukocytosis: Further review of the chart confirms leukocytosis is a chronic process. Leukocytosis was noted 04/2009, 01/2009, 04/2007, ranging from 12.7-14.0. Furthermore records from Dr. Renelda Loma office not a workup in 2002 for leukocytosis including abdominal ultrasound to exclude splenomegaly. At that time the etiology was unclear but no further evaluation was suggested. Check a peripheral smear. I do not think this represents infection. 3. Status post mixed metabolic alkalosis and AG metabolic acidosis: Resolved. Thought to be secondary to vomiting and lactate. 4. Hypokalemia: Replete. Magnesium normal. 5. SVT: Occurred in context of acute illness, sepsis. Review of the record does not demonstrate any evidence of atrial fibrillation. Anticoagulation not indicated. Continue long-acting diltiazem. EKGs as below. Beta blocker. TSH normal. Echocardiogram unremarkable. EKG 1/17: Sinus tachycardia EKG 1/19: Supraventricular tachycardia. Note SVT also seen 05/2002 per cardiology note. 6. Adrenal nodule: Consider outpatient evaluation.  7. Hypertension: Some improvement. Resumed medications  Code Status: Full Family Communication: None present  Disposition Plan: Pending improvement  [name], MD  [facility] Team 5 Pager [phone] If 7PM-7AM, please contact night-coverage at [email or web address], password [facility] 06/09/2012, 4:54 PM  LOS: 8 days   Brief narrative: 67 y/o woman with PMH only significant for HTN who presents  with a 24 hour history of subjective fevers, chills, diffuse abdominal pain, n/v, body aches. She continues to have intractable n/v despite treatment in the ED and we have been asked to admit her for further treatment. CT scan in the ED was remarkable for ileus.   Early in hospitalization developed supraventricular tachycardia, lactic acid 8.1, white blood cell count up to 33. Transferred to ICU for increasing tachycardia, tachypnea and lactic acidosis. NGT was placed with 3 L of feculent material obtained shortly after placement. Sudden SVT while ambulating 1/19. Subsequently improved. Transferred to the hospitalist service.  Presented with abdominal pain, nausea, vomiting, severe lactic acidosis. CT on admission demonstrated ileus, loculated paracolic gutter of unclear significance, mesenteric free fluid consider inflammatory process. Was started empirically on Zosyn and Flagyl. Sepsis/lactic acidosis resolved with IV fluids. General surgery signed off and recommended no intervention. Leukocytosis improved but not resolved. Microbiology unremarkable.  Consultants:  General surgery  Critical care medicine  Procedures:  2-D echocardiogram: Left ventricle: The cavity size was normal. There was mild concentric hypertrophy. Systolic function was normal. The estimated ejection fraction was in the range of 50% to 55%. Wall motion was normal; there were no regional wall motion abnormalities. There was an increased relative contribution of atrial contraction to ventricular filling. Doppler parameters are consistent with abnormal left ventricular relaxation (grade 1 diastolic dysfunction).  Antibiotics:  Zosyn 1/17 >>1/24  Vancomycin 1/18 >> 1/24  HPI/Subjective: Afebrile, vital signs stable. Still having some loose stools. Some nausea. Tolerating liquids.  Objective: Filed Vitals:   06/08/12 1407 06/08/12 2100 06/09/12 0615 06/09/12 1407  BP: 97/60 131/54 142/50 124/49  Pulse:  75 88 77    Temp: 98.9 F (37.2 C) 99 F (37.2 C) 99 F (37.2 C) 98.8 F (37.1 C)  TempSrc: Oral Oral Oral Oral  Resp: 17 16 16 19   Height:      Weight:      SpO2:  97% 100% 96% 99%    Intake/Output Summary (Last 24 hours) at 06/09/12 1654 Last data filed at 06/09/12 0800  Gross per 24 hour  Intake    240 ml  Output      1 ml  Net    239 ml   Filed Weights   06/01/12 1648  Weight: 108.8 kg (239 lb 13.8 oz)    Exam:  General:  Appears calm and comfortable Cardiovascular: RRR, no m/r/g.  Respiratory: CTA bilaterally, no w/r/r. Normal respiratory effort. Abdomen: soft, nt, nondistended Psychiatric: grossly normal mood and affect, speech fluent and appropriate  Data Reviewed: Basic Metabolic Panel:  Lab 06/09/12 1610 06/08/12 0620 06/07/12 0555 06/06/12 0618 06/05/12 0607 06/04/12 0445  NA 145 143 145 144 -- 142  K 3.4* 2.4* 3.6 2.6* -- 3.3*  CL 105 104 109 108 -- 110  CO2 30 28 29 25  -- 23  GLUCOSE 94 101* 87 96 -- 87  BUN 3* <3* 3* 4* -- 12  CREATININE 1.14* 0.93 0.93 0.83 -- 0.70  CALCIUM 9.1 8.7 8.8 8.4 -- 8.2*  MG 1.8 1.7 1.9 1.8 1.9 --  PHOS -- 3.5 2.7 2.6 2.2* 1.5*   Liver Function Tests:  Lab 06/03/12 0547  AST 13  ALT 8  ALKPHOS 56  BILITOT 0.3  PROT 5.9*  ALBUMIN 2.4*   CBC:  Lab 06/09/12 0552 06/08/12 0620 06/07/12 0555 06/06/12 0618 06/04/12 0445  WBC 16.2* 14.9* 16.6* 15.2* 18.2*  NEUTROABS 12.5* -- -- -- --  HGB 10.8* 10.8* 11.2* 11.2* 10.8*  HCT 33.5* 33.8* 34.3* 34.9* 33.0*  MCV 74.8* 74.9* 74.1* 73.8* 74.2*  PLT 429* 389 388 378 340   Cardiac Enzymes:  Lab 06/03/12 0546  CKTOTAL --  CKMB --  CKMBINDEX --  TROPONINI <0.30     Recent Results (from the past 240 hour(s))  CULTURE, BLOOD (ROUTINE X 2)     Status: Normal   Collection Time   06/02/12 11:42 AM      Component Value Range Status Comment   Specimen Description BLOOD RIGHT ARM   Final    Special Requests BOTTLES DRAWN AEROBIC AND ANAEROBIC 10CC   Final    Culture  Setup Time  06/02/2012 20:53   Final    Culture NO GROWTH 5 DAYS   Final    Report Status 06/08/2012 FINAL   Final   CULTURE, BLOOD (ROUTINE X 2)     Status: Normal   Collection Time   06/02/12 11:48 AM      Component Value Range Status Comment   Specimen Description BLOOD RIGHT HAND   Final    Special Requests BOTTLES DRAWN AEROBIC ONLY 10CC   Final    Culture  Setup Time 06/02/2012 20:53   Final    Culture NO GROWTH 5 DAYS   Final    Report Status 06/08/2012 FINAL   Final   CLOSTRIDIUM DIFFICILE BY PCR     Status: Normal   Collection Time   06/02/12  1:35 PM      Component Value Range Status Comment   C difficile by pcr NEGATIVE  NEGATIVE Final      Studies: Ct Abdomen Pelvis W Contrast  06/08/2012  *RADIOLOGY REPORT*  Clinical Data: Worsening abdominal pain and nausea.  Dilated bowel loops on KUB. Follow up right abdominal fluid collection on prior CT.  CT ABDOMEN AND PELVIS WITH CONTRAST  Technique:  Multidetector CT imaging of the abdomen and pelvis was performed following the standard protocol  during bolus administration of intravenous contrast.  Contrast: 1 OMNIPAQUE IOHEXOL 300 MG/ML  SOLN, OMNIPAQUE IOHEXOL 300 MG/ML  SOLN  Comparison: 06/01/2012  Findings: There is persistent generalized dilatation of small bowel as well as gaseous distention of the colon with air-fluid levels seen throughout.  This is consistent with an ileus.  No transition point is seen.  Diverticulosis is again seen involving the descending and sigmoid colon, however there is no evidence of diverticulitis.  No focal inflammatory process is identified. Normal appendix is visualized.  A small amount of free fluid is now seen in the pelvic cul-de-sac which is nonspecific.  No definite abscess or other abnormal fluid collections are identified.  Prior hysterectomy noted.  Adnexal regions are unremarkable in appearance.  Shotty lymph nodes are again seen within the small bowel mesentery, but no pathologically enlarged nodes are  identified within the abdomen or pelvis.  No soft tissue masses are identified. Several renal cysts are again seen bilaterally however there is no evidence of renal mass or hydronephrosis.  The liver, spleen, and pancreas are normal appearance.  Prior cholecystectomy noted.  No evidence of biliary ductal dilatation.  A small to left adrenal soft tissue nodule measuring 1.4 cm is stable and likely represents a small adrenal adenoma.  IMPRESSION:  1.  Persistent adynamic ileus pattern, without significant change. 2.  Diverticulosis.  No radiographic evidence of diverticulitis. 3.  No focal inflammatory process or abscess identified.  Small amount of free fluid noted in the pelvic cul-de-sac. 4.  Stable small left adrenal nodule, likely a benign adenoma in the absence of history of malignancy.   Original Report Authenticated By: Myles Rosenthal, M.D.     Scheduled Meds:    . aspirin EC  81 mg Oral Daily  . diltiazem  240 mg Oral Daily  . guaiFENesin  600 mg Oral BID  . heparin subcutaneous  5,000 Units Subcutaneous Q8H  . lisinopril  20 mg Oral Daily   And  . hydrochlorothiazide  25 mg Oral Daily  . loperamide  2 mg Oral BID  . pantoprazole (PROTONIX) IV  40 mg Intravenous Q24H   Continuous Infusions:   Principal Problem:  *Abdominal pain Active Problems:  Nausea & vomiting  Body aches  Flu-like symptoms  Ileus  HTN (hypertension)  Adrenal nodule  Lactic acidosis  Sepsis  Partial small bowel obstruction     [name], MD  [facility] Team 5 Pager [phone] If 7PM-7AM, please contact night-coverage at [email or web address], password [facility] 06/09/2012, 4:54 PM  LOS: 8 days   Time spent: 20 minutes

## 2012-06-10 DIAGNOSIS — I498 Other specified cardiac arrhythmias: Secondary | ICD-10-CM

## 2012-06-10 DIAGNOSIS — I471 Supraventricular tachycardia: Secondary | ICD-10-CM

## 2012-06-10 LAB — BASIC METABOLIC PANEL
BUN: 4 mg/dL — ABNORMAL LOW (ref 6–23)
CO2: 32 mEq/L (ref 19–32)
Chloride: 104 mEq/L (ref 96–112)
Creatinine, Ser: 1.03 mg/dL (ref 0.50–1.10)
GFR calc Af Amer: 63 mL/min — ABNORMAL LOW (ref >90)
Potassium: 3.2 mEq/L — ABNORMAL LOW (ref 3.5–5.1)

## 2012-06-10 MED ORDER — PANTOPRAZOLE SODIUM 40 MG PO TBEC
40.0000 mg | DELAYED_RELEASE_TABLET | Freq: Every day | ORAL | Status: DC
  Administered 2012-06-11: 40 mg via ORAL
  Filled 2012-06-10: qty 1

## 2012-06-10 MED ORDER — POTASSIUM CHLORIDE CRYS ER 20 MEQ PO TBCR
40.0000 meq | EXTENDED_RELEASE_TABLET | Freq: Once | ORAL | Status: AC
  Administered 2012-06-10: 40 meq via ORAL
  Filled 2012-06-10: qty 2

## 2012-06-10 NOTE — Progress Notes (Signed)
Patient  Ambulated X2 on entire unit on RA with no distress. Nurse to continue monitoring patient

## 2012-06-10 NOTE — Progress Notes (Addendum)
Subjective: Feels fine, wants to go, still with a little nausea and loose stool  Objective: Vital signs in last 24 hours: Temp:  [98.3 F (36.8 C)-99 F (37.2 C)] 98.3 F (36.8 C) (01/26 0625) Pulse Rate:  [73-82] 73  (01/26 0625) Resp:  [19-20] 20  (01/26 0625) BP: (124-151)/(49-80) 132/51 mmHg (01/26 0625) SpO2:  [97 %-99 %] 97 % (01/26 0625) Last BM Date: 06/09/12  Intake/Output from previous day: 01/25 0701 - 01/26 0700 In: 2040 [P.O.:2040] Out: 3 [Urine:3] Intake/Output this shift:    GI: soft nontender   Lab Results:   Basename 06/09/12 0552 06/08/12 0620  WBC 16.2* 14.9*  HGB 10.8* 10.8*  HCT 33.5* 33.8*  PLT 429* 389   BMET  Basename 06/10/12 0630 06/09/12 0552  NA 143 145  K 3.2* 3.4*  CL 104 105  CO2 32 30  GLUCOSE 92 94  BUN 4* 3*  CREATININE 1.03 1.14*  CALCIUM 8.9 9.1    Studies/Results: Ct Abdomen Pelvis W Contrast  06/08/2012  *RADIOLOGY REPORT*  Clinical Data: Worsening abdominal pain and nausea.  Dilated bowel loops on KUB. Follow up right abdominal fluid collection on prior CT.  CT ABDOMEN AND PELVIS WITH CONTRAST  Technique:  Multidetector CT imaging of the abdomen and pelvis was performed following the standard protocol during bolus administration of intravenous contrast.  Contrast: 1 OMNIPAQUE IOHEXOL 300 MG/ML  SOLN, OMNIPAQUE IOHEXOL 300 MG/ML  SOLN  Comparison: 06/01/2012  Findings: There is persistent generalized dilatation of small bowel as well as gaseous distention of the colon with air-fluid levels seen throughout.  This is consistent with an ileus.  No transition point is seen.  Diverticulosis is again seen involving the descending and sigmoid colon, however there is no evidence of diverticulitis.  No focal inflammatory process is identified. Normal appendix is visualized.  A small amount of free fluid is now seen in the pelvic cul-de-sac which is nonspecific.  No definite abscess or other abnormal fluid collections are  identified.  Prior hysterectomy noted.  Adnexal regions are unremarkable in appearance.  Shotty lymph nodes are again seen within the small bowel mesentery, but no pathologically enlarged nodes are identified within the abdomen or pelvis.  No soft tissue masses are identified. Several renal cysts are again seen bilaterally however there is no evidence of renal mass or hydronephrosis.  The liver, spleen, and pancreas are normal appearance.  Prior cholecystectomy noted.  No evidence of biliary ductal dilatation.  A small to left adrenal soft tissue nodule measuring 1.4 cm is stable and likely represents a small adrenal adenoma.  IMPRESSION:  1.  Persistent adynamic ileus pattern, without significant change. 2.  Diverticulosis.  No radiographic evidence of diverticulitis. 3.  No focal inflammatory process or abscess identified.  Small amount of free fluid noted in the pelvic cul-de-sac. 4.  Stable small left adrenal nodule, likely a benign adenoma in the absence of history of malignancy.   Original Report Authenticated By: Myles Rosenthal, M.D.     Anti-infectives: Anti-infectives     Start     Dose/Rate Route Frequency Ordered Stop   06/02/12 1800   vancomycin (VANCOCIN) IVPB 1000 mg/200 mL premix  Status:  Discontinued        1,000 mg 200 mL/hr over 60 Minutes Intravenous Every 12 hours 06/02/12 0550 06/08/12 0948   06/02/12 0600   metroNIDAZOLE (FLAGYL) IVPB 500 mg  Status:  Discontinued        500 mg 100 mL/hr over 60 Minutes  Intravenous Every 8 hours 06/02/12 0547 06/04/12 1046   06/02/12 0600   vancomycin (VANCOCIN) 2,500 mg in sodium chloride 0.9 % 500 mL IVPB        2,500 mg 250 mL/hr over 120 Minutes Intravenous  Once 06/02/12 0550 06/02/12 1008   06/01/12 2300   piperacillin-tazobactam (ZOSYN) IVPB 3.375 g  Status:  Discontinued        3.375 g 12.5 mL/hr over 240 Minutes Intravenous Every 8 hours 06/01/12 2250 06/08/12 0948   06/01/12 1730   oseltamivir (TAMIFLU) capsule 75 mg  Status:   Discontinued        75 mg Oral 2 times daily 06/01/12 1713 06/02/12 0337          Assessment/Plan: Viral illness, likely  No indication for surgery, will sign off   [facility] 06/10/2012

## 2012-06-10 NOTE — Progress Notes (Signed)
Physical Therapy Discharge Patient Details Name: Kimberlyn Quiocho MRN: [id number] DOB: 18-Mar-1946 Today's Date: 06/10/2012 Time: 1001-1001 PT Time Calculation (min): 0 min  Patient discharged from PT services secondary to:  Spoke with patient at bedside, she reports up and ambulating frequently, denies disturbances of gait, balance or gross motor strength.  She lives alone but has multiple family members who can and will provide any assistance she needs.  She denies worries or concerns about ability to access home or to perform daily activities.  PT eval deferred at her agreement.  She does ask for more education from nurse/MD regarding side effects of medications that may increase her risk of falling due to dizziness, drowsiness or muscle weakness, and she was directed to ask nurse about side effects at next administration.  There are no acute or post acute PT needs identified at this time and patient instructed to report any change in function to MD in the future.  Signing off case.   GP     Dennis Bast 06/10/2012, 10:06 AM

## 2012-06-10 NOTE — Progress Notes (Signed)
[facility] PROGRESS NOTE  Talli Kimmer JYN:[id number] DOB: [birth year] DOA: 06/01/2012 PCP: [name], MD  Assessment/Plan: 1. Ileus, sepsis on admission: Clinically improving, suspect secondary to hypokalemia and diltiazem. Surgery signed off. CT revealed persistent ileus, etiology unclear. Aggressive potassium repletion. Completed antibiotics yesterday, no fever.  2. Leukocytosis: followup as outpatient.Further review of the chart confirms leukocytosis is a chronic process. Leukocytosis was noted 04/2009, 01/2009, 04/2007, ranging from 12.7-14.0. Furthermore records from Dr. Renelda Loma office not a workup in 2002 for leukocytosis including abdominal ultrasound to exclude splenomegaly. At that time the etiology was unclear but no further evaluation was suggested. Check a peripheral smear. I do not think this represents infection. 3. Status post mixed metabolic alkalosis and AG metabolic acidosis: Resolved. Thought to be secondary to vomiting and lactate. 4. Hypokalemia: Replete further. Magnesium normal. 5. SVT: Occurred in context of acute illness, sepsis. Review of the record does not demonstrate any evidence of atrial fibrillation. Anticoagulation not indicated. Continue long-acting diltiazem. EKGs as below. Beta blocker. TSH normal. Echocardiogram unremarkable. EKG 1/17: Sinus tachycardia EKG 1/19: Supraventricular tachycardia. Note SVT also seen 05/2002 per cardiology note. 6. Adrenal nodule: Consider outpatient evaluation.  7. Hypertension: Stable.  Code Status: Full Family Communication: None present  Disposition Plan: Home 1/27  [name], MD  [facility] Team 5 Pager [phone] If 7PM-7AM, please contact night-coverage at [email or web address], password [facility] 06/10/2012, 1:44 PM  LOS: 9 days   Brief narrative: 67 y/o woman with PMH only significant for HTN who presents with a 24 hour history of subjective fevers, chills, diffuse abdominal pain, n/v, body aches. She continues  to have intractable n/v despite treatment in the ED and we have been asked to admit her for further treatment. CT scan in the ED was remarkable for ileus.   Early in hospitalization developed supraventricular tachycardia, lactic acid 8.1, white blood cell count up to 33. Transferred to ICU for increasing tachycardia, tachypnea and lactic acidosis. NGT was placed with 3 L of feculent material obtained shortly after placement. Sudden SVT while ambulating 1/19. Subsequently improved. Transferred to the hospitalist service.  Presented with abdominal pain, nausea, vomiting, severe lactic acidosis. CT on admission demonstrated ileus, loculated paracolic gutter of unclear significance, mesenteric free fluid consider inflammatory process. Was started empirically on Zosyn and Flagyl. Sepsis/lactic acidosis resolved with IV fluids. General surgery signed off and recommended no intervention. Leukocytosis improved but not resolved. Microbiology unremarkable.  Consultants:  General surgery  Critical care medicine  Procedures:  2-D echocardiogram: Left ventricle: The cavity size was normal. There was mild concentric hypertrophy. Systolic function was normal. The estimated ejection fraction was in the range of 50% to 55%. Wall motion was normal; there were no regional wall motion abnormalities. There was an increased relative contribution of atrial contraction to ventricular filling. Doppler parameters are consistent with abnormal left ventricular relaxation (grade 1 diastolic dysfunction).  Antibiotics:  Zosyn 1/17 >>1/24  Vancomycin 1/18 >> 1/24  HPI/Subjective: Overall feeling better. Some nausea however would like to advance diet. Remains afebrile with stable vitals.  Objective: Filed Vitals:   06/09/12 0615 06/09/12 1407 06/09/12 2150 06/10/12 0625  BP: 142/50 124/49 151/80 132/51  Pulse: 88 77 82 73  Temp: 99 F (37.2 C) 98.8 F (37.1 C) 99 F (37.2 C) 98.3 F (36.8 C)  TempSrc: Oral Oral  Oral Oral  Resp: 16 19 20 20   Height:      Weight:      SpO2: 96% 99% 98% 97%    Intake/Output Summary (Last  24 hours) at 06/10/12 1344 Last data filed at 06/10/12 4098  Gross per 24 hour  Intake   1440 ml  Output      3 ml  Net   1437 ml   Filed Weights   06/01/12 1648  Weight: 108.8 kg (239 lb 13.8 oz)    Exam:  General:  Appears calm and comfortable Cardiovascular: RRR, no m/r/g.  Respiratory: CTA bilaterally, no w/r/r. Normal respiratory effort. Abdomen: soft, nt, nondistended, positive bowel sounds Psychiatric: grossly normal mood and affect, speech fluent and appropriate  Data Reviewed: Basic Metabolic Panel:  Lab 06/10/12 1191 06/09/12 0552 06/08/12 0620 06/07/12 0555 06/06/12 0618 06/05/12 0607 06/04/12 0445  NA 143 145 143 145 144 -- --  K 3.2* 3.4* 2.4* 3.6 2.6* -- --  CL 104 105 104 109 108 -- --  CO2 32 30 28 29 25  -- --  GLUCOSE 92 94 101* 87 96 -- --  BUN 4* 3* <3* 3* 4* -- --  CREATININE 1.03 1.14* 0.93 0.93 0.83 -- --  CALCIUM 8.9 9.1 8.7 8.8 8.4 -- --  MG 1.9 1.8 1.7 1.9 1.8 -- --  PHOS -- -- 3.5 2.7 2.6 2.2* 1.5*   CBC:  Lab 06/09/12 0552 06/08/12 0620 06/07/12 0555 06/06/12 0618 06/04/12 0445  WBC 16.2* 14.9* 16.6* 15.2* 18.2*  NEUTROABS 12.5* -- -- -- --  HGB 10.8* 10.8* 11.2* 11.2* 10.8*  HCT 33.5* 33.8* 34.3* 34.9* 33.0*  MCV 74.8* 74.9* 74.1* 73.8* 74.2*  PLT 429* 389 388 378 340    Recent Results (from the past 240 hour(s))  CULTURE, BLOOD (ROUTINE X 2)     Status: Normal   Collection Time   06/02/12 11:42 AM      Component Value Range Status Comment   Specimen Description BLOOD RIGHT ARM   Final    Special Requests BOTTLES DRAWN AEROBIC AND ANAEROBIC 10CC   Final    Culture  Setup Time 06/02/2012 20:53   Final    Culture NO GROWTH 5 DAYS   Final    Report Status 06/08/2012 FINAL   Final   CULTURE, BLOOD (ROUTINE X 2)     Status: Normal   Collection Time   06/02/12 11:48 AM      Component Value Range Status Comment   Specimen  Description BLOOD RIGHT HAND   Final    Special Requests BOTTLES DRAWN AEROBIC ONLY 10CC   Final    Culture  Setup Time 06/02/2012 20:53   Final    Culture NO GROWTH 5 DAYS   Final    Report Status 06/08/2012 FINAL   Final   CLOSTRIDIUM DIFFICILE BY PCR     Status: Normal   Collection Time   06/02/12  1:35 PM      Component Value Range Status Comment   C difficile by pcr NEGATIVE  NEGATIVE Final      Studies: No results found.  Scheduled Meds:    . aspirin EC  81 mg Oral Daily  . guaiFENesin  600 mg Oral BID  . heparin subcutaneous  5,000 Units Subcutaneous Q8H  . lisinopril  20 mg Oral Daily   And  . hydrochlorothiazide  25 mg Oral Daily  . loperamide  2 mg Oral BID  . metoprolol tartrate  50 mg Oral BID  . pantoprazole (PROTONIX) IV  40 mg Intravenous Q24H  . potassium chloride  40 mEq Oral BID   Continuous Infusions:   Principal Problem:  *Abdominal pain Active Problems:  Nausea & vomiting  Body aches  Flu-like symptoms  Ileus  HTN (hypertension)  Adrenal nodule  Lactic acidosis  Sepsis  Partial small bowel obstruction  Hypokalemia     [name], MD  [facility] Team 5 Pager [phone] If 7PM-7AM, please contact night-coverage at [email or web address], password [facility] 06/10/2012, 1:44 PM  LOS: 9 days   Time spent: 15 minutes

## 2012-06-11 LAB — BASIC METABOLIC PANEL
BUN: 6 mg/dL (ref 6–23)
CO2: 28 mEq/L (ref 19–32)
Chloride: 108 mEq/L (ref 96–112)
GFR calc Af Amer: 63 mL/min — ABNORMAL LOW (ref >90)
Potassium: 3.9 mEq/L (ref 3.5–5.1)

## 2012-06-11 MED ORDER — POTASSIUM CHLORIDE CRYS ER 20 MEQ PO TBCR: 20.0000 meq | EXTENDED_RELEASE_TABLET | Freq: Every day | ORAL | Status: DC

## 2012-06-11 MED ORDER — POTASSIUM CHLORIDE CRYS ER 20 MEQ PO TBCR
40.0000 meq | EXTENDED_RELEASE_TABLET | Freq: Every day | ORAL | Status: DC
  Administered 2012-06-11: 40 meq via ORAL
  Filled 2012-06-11: qty 2

## 2012-06-11 MED ORDER — METOPROLOL TARTRATE 50 MG PO TABS: 50.0000 mg | ORAL_TABLET | Freq: Two times a day (BID) | ORAL | Status: DC

## 2012-06-11 NOTE — Discharge Summary (Signed)
Physician Discharge Summary  Kimberly Sellers ZOX:[id number] DOB: Jun 21, 1945 DOA: 06/01/2012  PCP: [name], MD  Admit date: 06/01/2012 Discharge date: 06/11/2012  Recommendations for Outpatient Follow-up:  1. Followup resolution of ileus  2. Followup hypokalemia, consider repeat basic metabolic panel is clinically indicated  3. Followup chronic leukocytosis, see discussion below  4. Probable benign adrenal adenoma, consider further evaluation as an outpatient as clinically indicated, see report below   Follow-up Information    Follow up with [facility], [place]. In 1 week.   Contact information:   [address] [place] [address] [phone]         Discharge Diagnoses:  1. Abdominal pain, ileus, sepsis on admission  2. Prolonged ileus 3. Hypokalemia 4. Mixed metabolic alkalosis and anion gap metabolic acidosis 5. Supraventricular tachycardia 6. Chronic leukocytosis  7. Adrenal nodule  Discharge Condition:  improved Disposition:  home   Diet recommendation: heart healthy   Filed Weights   06/01/12 1648  Weight: 108.8 kg (239 lb 13.8 oz)    History of present illness:  67 y/o woman with PMH only significant for HTN who presents with a 24 hour history of subjective fevers, chills, diffuse abdominal pain, n/v, body aches. She continues to have intractable n/v despite treatment in the ED and we have been asked to admit her for further treatment. CT scan in the ED was remarkable for ileus.   Hospital Course:  [name] was admitted for further evaluation of nausea, vomiting and abdominal pain. CT on admission demonstrated ileus, loculated paracolic gutter of unclear significance, mesenteric free fluid consider inflammatory process. Was started empirically on Zosyn and Flagyl. Early in hospitalization developed supraventricular tachycardia, lactic acid 8.1, white blood cell count up to 33. Transferred to ICU for increasing tachycardia, tachypnea and lactic  acidosis. NGT was placed with 3 L of feculent material obtained shortly after placement. She was placed on broad-spectrum antibiotics and seen in consultation with Gen. surgery. Conservative management was pursued with gastric decompression. Subsequently improved with supportive care and was transferred to the hospitalist service. The etiology for lactic acidosis is unclear the patient was treated with broad-spectrum antibiotics. Repeat CT demonstrated no evidence of abscess or fluid collection. General surgery signed off and recommended no intervention.  Hospitalization was prolonged by ileus which gradually improved with potassium replacement and discontinuation of diltiazem. Leukocytosis was noted throughout hospitalization. Further record review demonstrated this was a chronic process, see below. Patient had episodes of supraventricular tachycardia, this resolved with medication, she continues on beta blocker at this point. Tolerating diet and stable for discharge.  1. Ileus, sepsis on admission: Clinically resolved, suspect secondary to hypokalemia and diltiazem. Surgery signed off. CT revealed persistent ileus, etiology unclear. Continue outpatient potassium repletion. Completed antibiotics, no fever.    2. Leukocytosis: followup as outpatient. Chart review confirms leukocytosis is a chronic process. Leukocytosis was noted 04/2009, 01/2009, 04/2007, ranging from 12.7-14.0. Furthermore records from Dr. Renelda Loma office not a workup in 2002 for leukocytosis including abdominal ultrasound to exclude splenomegaly. At that time the etiology was unclear but no further evaluation was suggested. Consider outpatient followup with hematology as clinically indicated.  3. Status post mixed metabolic alkalosis and AG metabolic acidosis: Resolved. Thought to be secondary to vomiting and lactate.  4. Hypokalemia: Replete further.    5. SVT: Occurred in context of acute illness, sepsis. Review of the record does not  demonstrate any evidence of atrial fibrillation.  EKGs as below. Continue Beta blocker. TSH normal. Echocardiogram unremarkable. EKG  1/17: Sinus tachycardia EKG 1/19: Supraventricular tachycardia. Note SVT also seen 05/2002 per cardiology note.  6. Adrenal nodule: Consider outpatient evaluation but thought to be benign .   7. Hypertension: Stable.  Consultants:  General surgery   Critical care medicine   Physical therapy: No followup  Procedures:  2-D echocardiogram: Left ventricle: The cavity size was normal. There was mild concentric hypertrophy. Systolic function was normal. The estimated ejection fraction was in the range of 50% to 55%. Wall motion was normal; there were no regional wall motion abnormalities. There was an increased relative contribution of atrial contraction to ventricular filling. Doppler parameters are consistent with abnormal left ventricular relaxation (grade 1 diastolic dysfunction).  Antibiotics:  Zosyn 1/17 >>1/24   Vancomycin 1/18 >> 1/24  Discharge Instructions  Discharge Orders    Future Orders Please Complete By Expires   Diet - low sodium heart healthy      Discharge instructions      Comments:   Diltiazem was stopped as this medication can cause slow bowel function. Metoprolol was substituted for this medication to help control your heart rate. Your white blood cell count is noted to be mildly elevated, this has been noted for approximately 12 years now and your primary care physician will be notified but for the present no further evaluation is recommended. Take potassium until your prescription runs out. Call your physician or seek immediate medical attention for recurrent nausea, vomiting, increased pain, fever or worsening of condition.   Activity as tolerated - No restrictions          Medication List     As of 06/11/2012 10:46 AM    STOP taking these medications         diltiazem 240 MG 24 hr capsule   Commonly known as: DILACOR XR       TAKE these medications         aspirin 81 MG tablet   Take 81 mg by mouth daily.      lisinopril-hydrochlorothiazide 20-25 MG per tablet   Commonly known as: PRINZIDE,ZESTORETIC   Take 1 tablet by mouth daily.      metoprolol 50 MG tablet   Commonly known as: LOPRESSOR   Take 1 tablet (50 mg total) by mouth 2 (two) times daily.      omeprazole 20 MG capsule   Commonly known as: PRILOSEC   Take 20 mg by mouth 2 (two) times daily.      potassium chloride SA 20 MEQ tablet   Commonly known as: K-DUR,KLOR-CON   Take 1 tablet (20 mEq total) by mouth daily.      VITAMIN C PO   Take 1 tablet by mouth daily.      VITAMIN D PO   Take 1 tablet by mouth daily.        The results of significant diagnostics from this hospitalization (including imaging, microbiology, ancillary and laboratory) are listed below for reference.    Significant Diagnostic Studies: Ct Abdomen Pelvis W Contrast  06/08/2012  *RADIOLOGY REPORT*  Clinical Data: Worsening abdominal pain and nausea.  Dilated bowel loops on KUB. Follow up right abdominal fluid collection on prior CT.  CT ABDOMEN AND PELVIS WITH CONTRAST  Technique:  Multidetector CT imaging of the abdomen and pelvis was performed following the standard protocol during bolus administration of intravenous contrast.  Contrast: 1 OMNIPAQUE IOHEXOL 300 MG/ML  SOLN, OMNIPAQUE IOHEXOL 300 MG/ML  SOLN  Comparison: 06/01/2012  Findings: There is persistent generalized dilatation of  small bowel as well as gaseous distention of the colon with air-fluid levels seen throughout.  This is consistent with an ileus.  No transition point is seen.  Diverticulosis is again seen involving the descending and sigmoid colon, however there is no evidence of diverticulitis.  No focal inflammatory process is identified. Normal appendix is visualized.  A small amount of free fluid is now seen in the pelvic cul-de-sac which is nonspecific.  No definite abscess or other abnormal  fluid collections are identified.  Prior hysterectomy noted.  Adnexal regions are unremarkable in appearance.  Shotty lymph nodes are again seen within the small bowel mesentery, but no pathologically enlarged nodes are identified within the abdomen or pelvis.  No soft tissue masses are identified. Several renal cysts are again seen bilaterally however there is no evidence of renal mass or hydronephrosis.  The liver, spleen, and pancreas are normal appearance.  Prior cholecystectomy noted.  No evidence of biliary ductal dilatation.  A small to left adrenal soft tissue nodule measuring 1.4 cm is stable and likely represents a small adrenal adenoma.  IMPRESSION:  1.  Persistent adynamic ileus pattern, without significant change. 2.  Diverticulosis.  No radiographic evidence of diverticulitis. 3.  No focal inflammatory process or abscess identified.  Small amount of free fluid noted in the pelvic cul-de-sac. 4.  Stable small left adrenal nodule, likely a benign adenoma in the absence of history of malignancy.   Original Report Authenticated By: Myles Rosenthal, M.D.    Ct Abdomen Pelvis W Contrast  06/01/2012  *RADIOLOGY REPORT*  Clinical Data: Nausea, vomiting, diarrhea, 52  CT ABDOMEN AND PELVIS WITH CONTRAST  Technique:  Multidetector CT imaging of the abdomen and pelvis was performed following the standard protocol during bolus administration of intravenous contrast.  Contrast: OMNIPAQUE IOHEXOL 300 MG/ML  SOLN  Comparison: None.  Findings: Post cholecystectomy.  Liver, pancreas, spleen, right adrenal gland are within normal limits.  1.3 cm left adrenal nodule is indeterminate.  Benign-appearing cyst in the left kidney.  Indeterminate hypodensities in the right kidney.  There is a central hypodensity in the right kidney on image 36.  Nondistended air and fluid-filled small bowel loops are scattered across abdomen with air-fluid levels.  No transition point to suggest obstruction.  Diverticulosis of the  descending and sigmoid colon is present without definite evidence of acute diverticulitis.  Small amount of free fluid is seen within the mesentery on images 62-66.  There is also fluid extending from the tip of the liver and along the right paracolic gutter with an unusual and lobulated appearance.  Loculated fluid is suggestive of unknown significance.  Advanced degenerative disc disease with facet arthropathy is present in the lumbar spine.  An element of spinal stenosis occurs at L4-5.  Atherosclerotic changes of the aorta, mid SMA, and iliac arteries are noted.  No obvious focal significant stenosis.  Unremarkable bladder.  Uterus absent.  No obvious ovarian mass.  IMPRESSION: Small bowel air fluid levels in an ileus pattern and no definite transition point.  Small amount of free fluid in the mesentery suggesting an inflammatory process.  Nonspecific 1.3 cm left adrenal nodule.  If there is a history of malignancy or strong concern for malignancy, MRI can be performed to further characterize.  Loculated peritoneal fluid along the right paracolic gutter of unknown significance.  No obvious omental caking or enhancing peritoneal mass.  Degenerative changes in the lumbar spine.   Original Report Authenticated By: Jolaine Click, M.D.  Microbiology: Recent Results (from the past 240 hour(s))  CULTURE, BLOOD (ROUTINE X 2)     Status: Normal   Collection Time   06/02/12 11:42 AM      Component Value Range Status Comment   Specimen Description BLOOD RIGHT ARM   Final    Special Requests BOTTLES DRAWN AEROBIC AND ANAEROBIC 10CC   Final    Culture  Setup Time 06/02/2012 20:53   Final    Culture NO GROWTH 5 DAYS   Final    Report Status 06/08/2012 FINAL   Final   CULTURE, BLOOD (ROUTINE X 2)     Status: Normal   Collection Time   06/02/12 11:48 AM      Component Value Range Status Comment   Specimen Description BLOOD RIGHT HAND   Final    Special Requests BOTTLES DRAWN AEROBIC ONLY 10CC   Final    Culture   Setup Time 06/02/2012 20:53   Final    Culture NO GROWTH 5 DAYS   Final    Report Status 06/08/2012 FINAL   Final   CLOSTRIDIUM DIFFICILE BY PCR     Status: Normal   Collection Time   06/02/12  1:35 PM      Component Value Range Status Comment   C difficile by pcr NEGATIVE  NEGATIVE Final      Labs: Basic Metabolic Panel:  Lab 06/11/12 6962 06/10/12 0630 06/09/12 0552 06/08/12 0620 06/07/12 0555 06/06/12 0618 06/05/12 0607  NA 145 143 145 143 145 -- --  K 3.9 3.2* 3.4* 2.4* 3.6 -- --  CL 108 104 105 104 109 -- --  CO2 28 32 30 28 29  -- --  GLUCOSE 96 92 94 101* 87 -- --  BUN 6 4* 3* <3* 3* -- --  CREATININE 1.04 1.03 1.14* 0.93 0.93 -- --  CALCIUM 8.8 8.9 9.1 8.7 8.8 -- --  MG 1.8 1.9 1.8 1.7 1.9 -- --  PHOS -- -- -- 3.5 2.7 2.6 2.2*   CBC:  Lab 06/09/12 0552 06/08/12 0620 06/07/12 0555 06/06/12 0618  WBC 16.2* 14.9* 16.6* 15.2*  NEUTROABS 12.5* -- -- --  HGB 10.8* 10.8* 11.2* 11.2*  HCT 33.5* 33.8* 34.3* 34.9*  MCV 74.8* 74.9* 74.1* 73.8*  PLT 429* 389 388 378   C Principal Problem:  *Abdominal pain Active Problems:  Nausea & vomiting  Body aches  Flu-like symptoms  Ileus  HTN (hypertension)  Adrenal nodule  Lactic acidosis  Sepsis  Partial small bowel obstruction  Hypokalemia  Supraventricular tachycardia   Time coordinating discharge: 25 minutes  Signed:  [name], MD [facility] 06/11/2012, 10:46 AM

## 2012-06-11 NOTE — Progress Notes (Signed)
Patient discharged to home with instructions. 

## 2012-06-11 NOTE — Progress Notes (Signed)
[facility] PROGRESS NOTE  Natesha Hassey YQM:[id number] DOB: 01/25/1946 DOA: 06/01/2012 PCP: [name], MD  Assessment/Plan: 1. Ileus, sepsis on admission: Clinically i resolved, suspect secondary to hypokalemia and diltiazem. Surgery signed off. CT revealed persistent ileus, etiology unclear. Continue outpatient potassium repletion. Completed antibiotics, no fever.  2. Leukocytosis: followup as outpatient. Chart review confirms leukocytosis is a chronic process. Leukocytosis was noted 04/2009, 01/2009, 04/2007, ranging from 12.7-14.0. Furthermore records from Dr. Renelda Loma office not a workup in 2002 for leukocytosis including abdominal ultrasound to exclude splenomegaly. At that time the etiology was unclear but no further evaluation was suggested. Consider outpatient followup with hematology as clinically indicated. 3. Status post mixed metabolic alkalosis and AG metabolic acidosis: Resolved. Thought to be secondary to vomiting and lactate. 4. Hypokalemia: Replete further.  5. SVT: Occurred in context of acute illness, sepsis. Review of the record does not demonstrate any evidence of atrial fibrillation.  EKGs as below. Continue Beta blocker. TSH normal. Echocardiogram unremarkable. EKG 1/17: Sinus tachycardia EKG 1/19: Supraventricular tachycardia. Note SVT also seen 05/2002 per cardiology note. 6. Adrenal nodule: Consider outpatient evaluation.  7. Hypertension: Stable.   Code Status: Full Family Communication: None present  Disposition Plan: Home 1/27  [name], MD  [facility] Team 5 Pager [phone] If 7PM-7AM, please contact night-coverage at [email or web address], password [facility] 06/11/2012, 10:31 AM  LOS: 10 days   Brief narrative: 67 y/o woman with PMH only significant for HTN who presents with a 24 hour history of subjective fevers, chills, diffuse abdominal pain, n/v, body aches. She continues to have intractable n/v despite treatment in the ED and we have been asked to  admit her for further treatment. CT scan in the ED was remarkable for ileus.   Early in hospitalization developed supraventricular tachycardia, lactic acid 8.1, white blood cell count up to 33. Transferred to ICU for increasing tachycardia, tachypnea and lactic acidosis. NGT was placed with 3 L of feculent material obtained shortly after placement. Sudden SVT while ambulating 1/19. Subsequently improved. Transferred to the hospitalist service.  Presented with abdominal pain, nausea, vomiting, severe lactic acidosis. CT on admission demonstrated ileus, loculated paracolic gutter of unclear significance, mesenteric free fluid consider inflammatory process. Was started empirically on Zosyn and Flagyl. Sepsis/lactic acidosis resolved with IV fluids. General surgery signed off and recommended no intervention. Leukocytosis improved but not resolved. Microbiology unremarkable.  Consultants:  General surgery  Critical care medicine  Physical therapy: No followup  Procedures:  2-D echocardiogram: Left ventricle: The cavity size was normal. There was mild concentric hypertrophy. Systolic function was normal. The estimated ejection fraction was in the range of 50% to 55%. Wall motion was normal; there were no regional wall motion abnormalities. There was an increased relative contribution of atrial contraction to ventricular filling. Doppler parameters are consistent with abnormal left ventricular relaxation (grade 1 diastolic dysfunction).  Antibiotics:  Zosyn 1/17 >>1/24  Vancomycin 1/18 >> 1/24  HPI/Subjective: Feels better. Tolerating regular diet. Diarrhea has decreased. No pain of note.  Objective: Filed Vitals:   06/10/12 0625 06/10/12 1409 06/10/12 2115 06/11/12 0528  BP: 132/51 152/78 145/63 144/74  Pulse: 73 62 86 80  Temp: 98.3 F (36.8 C) 98.4 F (36.9 C) 98.7 F (37.1 C) 99.1 F (37.3 C)  TempSrc: Oral Oral Oral Oral  Resp: 20 16 16 16   Height:      Weight:      SpO2: 97%  98% 97% 99%    Intake/Output Summary (Last 24 hours) at 06/11/12 1031 Last data  filed at 06/11/12 0200  Gross per 24 hour  Intake   1800 ml  Output    650 ml  Net   1150 ml   Filed Weights   06/01/12 1648  Weight: 108.8 kg (239 lb 13.8 oz)    Exam:  General:  Appears calm and comfortable Cardiovascular: RRR, no m/r/g.  Respiratory: CTA bilaterally, no w/r/r. Normal respiratory effort. Abdomen: soft, nt, nondistended Psychiatric: grossly normal mood and affect, speech fluent and appropriate  Data Reviewed: Basic Metabolic Panel:  Lab 06/11/12 0981 06/10/12 0630 06/09/12 0552 06/08/12 0620 06/07/12 0555 06/06/12 0618 06/05/12 0607  NA 145 143 145 143 145 -- --  K 3.9 3.2* 3.4* 2.4* 3.6 -- --  CL 108 104 105 104 109 -- --  CO2 28 32 30 28 29  -- --  GLUCOSE 96 92 94 101* 87 -- --  BUN 6 4* 3* <3* 3* -- --  CREATININE 1.04 1.03 1.14* 0.93 0.93 -- --  CALCIUM 8.8 8.9 9.1 8.7 8.8 -- --  MG 1.8 1.9 1.8 1.7 1.9 -- --  PHOS -- -- -- 3.5 2.7 2.6 2.2*   CBC:  Lab 06/09/12 0552 06/08/12 0620 06/07/12 0555 06/06/12 0618  WBC 16.2* 14.9* 16.6* 15.2*  NEUTROABS 12.5* -- -- --  HGB 10.8* 10.8* 11.2* 11.2*  HCT 33.5* 33.8* 34.3* 34.9*  MCV 74.8* 74.9* 74.1* 73.8*  PLT 429* 389 388 378    Recent Results (from the past 240 hour(s))  CULTURE, BLOOD (ROUTINE X 2)     Status: Normal   Collection Time   06/02/12 11:42 AM      Component Value Range Status Comment   Specimen Description BLOOD RIGHT ARM   Final    Special Requests BOTTLES DRAWN AEROBIC AND ANAEROBIC 10CC   Final    Culture  Setup Time 06/02/2012 20:53   Final    Culture NO GROWTH 5 DAYS   Final    Report Status 06/08/2012 FINAL   Final   CULTURE, BLOOD (ROUTINE X 2)     Status: Normal   Collection Time   06/02/12 11:48 AM      Component Value Range Status Comment   Specimen Description BLOOD RIGHT HAND   Final    Special Requests BOTTLES DRAWN AEROBIC ONLY 10CC   Final    Culture  Setup Time 06/02/2012 20:53    Final    Culture NO GROWTH 5 DAYS   Final    Report Status 06/08/2012 FINAL   Final   CLOSTRIDIUM DIFFICILE BY PCR     Status: Normal   Collection Time   06/02/12  1:35 PM      Component Value Range Status Comment   C difficile by pcr NEGATIVE  NEGATIVE Final      Studies: No results found.  Scheduled Meds:    . aspirin EC  81 mg Oral Daily  . guaiFENesin  600 mg Oral BID  . heparin subcutaneous  5,000 Units Subcutaneous Q8H  . lisinopril  20 mg Oral Daily   And  . hydrochlorothiazide  25 mg Oral Daily  . loperamide  2 mg Oral BID  . metoprolol tartrate  50 mg Oral BID  . pantoprazole  40 mg Oral Daily   Continuous Infusions:   Principal Problem:  *Abdominal pain Active Problems:  Nausea & vomiting  Body aches  Flu-like symptoms  Ileus  HTN (hypertension)  Adrenal nodule  Lactic acidosis  Sepsis  Partial small bowel obstruction  Hypokalemia  Supraventricular tachycardia     [name], MD  [facility] Team 5 Pager [phone] If 7PM-7AM, please contact night-coverage at [email or web address], password [facility] 06/11/2012, 10:31 AM  LOS: 10 days

## 2012-08-08 ENCOUNTER — Other Ambulatory Visit (HOSPITAL_COMMUNITY): Payer: Self-pay | Admitting: Internal Medicine

## 2012-08-08 DIAGNOSIS — Z1231 Encounter for screening mammogram for malignant neoplasm of breast: Secondary | ICD-10-CM

## 2012-09-17 ENCOUNTER — Ambulatory Visit (HOSPITAL_COMMUNITY)
Admission: RE | Admit: 2012-09-17 | Discharge: 2012-09-17 | Disposition: A | Discharge status: Home / Self Care | Payer: Medicare Other | Source: Ambulatory Visit | Attending: Internal Medicine | Admitting: Internal Medicine

## 2012-09-17 DIAGNOSIS — Z1231 Encounter for screening mammogram for malignant neoplasm of breast: Secondary | ICD-10-CM

## 2013-08-20 ENCOUNTER — Other Ambulatory Visit (HOSPITAL_COMMUNITY): Payer: Self-pay | Admitting: Internal Medicine

## 2013-08-20 DIAGNOSIS — Z1231 Encounter for screening mammogram for malignant neoplasm of breast: Secondary | ICD-10-CM

## 2013-09-19 ENCOUNTER — Ambulatory Visit (HOSPITAL_COMMUNITY)
Admission: RE | Admit: 2013-09-19 | Discharge: 2013-09-19 | Disposition: A | Discharge status: Home / Self Care | Payer: Medicare Other | Source: Ambulatory Visit | Attending: Internal Medicine | Admitting: Internal Medicine

## 2013-09-19 DIAGNOSIS — Z1231 Encounter for screening mammogram for malignant neoplasm of breast: Secondary | ICD-10-CM | POA: Insufficient documentation

## 2014-09-24 ENCOUNTER — Other Ambulatory Visit (HOSPITAL_COMMUNITY): Payer: Self-pay | Admitting: Internal Medicine

## 2014-09-24 DIAGNOSIS — Z1231 Encounter for screening mammogram for malignant neoplasm of breast: Secondary | ICD-10-CM

## 2014-09-30 ENCOUNTER — Ambulatory Visit (HOSPITAL_COMMUNITY)
Admission: RE | Admit: 2014-09-30 | Discharge: 2014-09-30 | Disposition: A | Discharge status: Home / Self Care | Payer: Medicare Other | Source: Ambulatory Visit | Attending: Internal Medicine | Admitting: Internal Medicine

## 2014-09-30 DIAGNOSIS — Z1231 Encounter for screening mammogram for malignant neoplasm of breast: Secondary | ICD-10-CM | POA: Diagnosis not present

## 2015-06-18 ENCOUNTER — Encounter (HOSPITAL_COMMUNITY): Payer: Self-pay | Admitting: *Deleted

## 2015-06-18 ENCOUNTER — Emergency Department (HOSPITAL_COMMUNITY)
Admission: EM | Admit: 2015-06-18 | Discharge: 2015-06-18 | Disposition: A | Discharge status: Home / Self Care | Payer: Medicare Other | Attending: Emergency Medicine | Admitting: Emergency Medicine

## 2015-06-18 ENCOUNTER — Emergency Department (HOSPITAL_COMMUNITY): Payer: Medicare Other

## 2015-06-18 DIAGNOSIS — I10 Essential (primary) hypertension: Secondary | ICD-10-CM | POA: Insufficient documentation

## 2015-06-18 DIAGNOSIS — R531 Weakness: Secondary | ICD-10-CM | POA: Diagnosis not present

## 2015-06-18 DIAGNOSIS — Z7982 Long term (current) use of aspirin: Secondary | ICD-10-CM | POA: Diagnosis not present

## 2015-06-18 DIAGNOSIS — R000 Tachycardia, unspecified: Secondary | ICD-10-CM | POA: Diagnosis not present

## 2015-06-18 DIAGNOSIS — Z79899 Other long term (current) drug therapy: Secondary | ICD-10-CM | POA: Insufficient documentation

## 2015-06-18 DIAGNOSIS — F419 Anxiety disorder, unspecified: Secondary | ICD-10-CM | POA: Insufficient documentation

## 2015-06-18 DIAGNOSIS — M79601 Pain in right arm: Secondary | ICD-10-CM | POA: Insufficient documentation

## 2015-06-18 LAB — CBC
HCT: 43.6 % (ref 36.0–46.0)
HEMOGLOBIN: 14.8 g/dL (ref 12.0–15.0)
MCH: 27.4 pg (ref 26.0–34.0)
MCHC: 33.9 g/dL (ref 30.0–36.0)
MCV: 80.7 fL (ref 78.0–100.0)
Platelets: 304 10*3/uL (ref 150–400)
RBC: 5.4 MIL/uL — ABNORMAL HIGH (ref 3.87–5.11)
RDW: 14.5 % (ref 11.5–15.5)
WBC: 11.7 10*3/uL — ABNORMAL HIGH (ref 4.0–10.5)

## 2015-06-18 LAB — BASIC METABOLIC PANEL
ANION GAP: 11 (ref 5–15)
BUN: 15 mg/dL (ref 6–20)
CALCIUM: 9.4 mg/dL (ref 8.9–10.3)
CO2: 24 mmol/L (ref 22–32)
Chloride: 102 mmol/L (ref 101–111)
Creatinine, Ser: 0.88 mg/dL (ref 0.44–1.00)
GFR calc Af Amer: >60 mL/min (ref >60)
GFR calc non Af Amer: >60 mL/min (ref >60)
GLUCOSE: 120 mg/dL — AB (ref 65–99)
Potassium: 3.7 mmol/L (ref 3.5–5.1)
Sodium: 137 mmol/L (ref 135–145)

## 2015-06-18 LAB — TROPONIN I

## 2015-06-18 NOTE — ED Notes (Signed)
The pt is c/o weakness and tachycardia today no pain.  She checked her blood  Sugar and it was in the 200s.  She was diet controlled blood sugars  No diabetic med .  Rt arm pain for 2 days  She had motrin that helped.  Some sob

## 2015-06-18 NOTE — ED Provider Notes (Signed)
CSN: [id number]     Arrival date & time 06/18/15  1845 History   First MD Initiated Contact with Patient 06/18/15 2051     Chief Complaint  Patient presents with  . Weakness      HPI The pt is c/o weakness and tachycardia today no pain. She checked her blood Sugar and it was in the 200s. She was diet controlled blood sugars No diabetic med . Rt arm pain for 2 days She had motrin that helped. Some sob Past Medical History  Diagnosis Date  . Hypertension   . Anxiety    Past Surgical History  Procedure Laterality Date  . Cholecystectomy     No family history on file. Social History  Substance Use Topics  . Smoking status: Never Smoker   . Smokeless tobacco: None  . Alcohol Use: No   OB History    No data available     Review of Systems  All other systems reviewed and are negative.     Allergies  Review of patient's allergies indicates no known allergies.  Home Medications   Prior to Admission medications   Medication Sig Start Date End Date Taking? Authorizing Provider  amLODipine (NORVASC) 10 MG tablet Take 10 mg by mouth daily.   Yes Historical Provider, MD  aspirin 81 MG tablet Take 81 mg by mouth daily.   Yes Historical Provider, MD  Cholecalciferol (VITAMIN D3) 2000 units capsule Take 2,000 Units by mouth daily.   Yes Historical Provider, MD  cycloSPORINE (RESTASIS) 0.05 % ophthalmic emulsion Place 1 drop into both eyes daily as needed. For dry eyes per patient   Yes Historical Provider, MD  lisinopril-hydrochlorothiazide (PRINZIDE,ZESTORETIC) 20-25 MG tablet Take 1 tablet by mouth daily.   Yes Historical Provider, MD  loratadine (CLARITIN) 10 MG tablet Take 10 mg by mouth daily.   Yes Historical Provider, MD  pantoprazole (PROTONIX) 20 MG tablet Take 20 mg by mouth daily.   Yes Historical Provider, MD  QUEtiapine (SEROQUEL) 25 MG tablet Take 25 mg by mouth at bedtime.   Yes Historical Provider, MD  metoprolol (LOPRESSOR) 50 MG tablet Take 1 tablet (50 mg  total) by mouth 2 (two) times daily. Patient not taking: Reported on 06/18/2015 06/11/12   Standley Brooking, MD  potassium chloride SA (K-DUR,KLOR-CON) 20 MEQ tablet Take 1 tablet (20 mEq total) by mouth daily. Patient not taking: Reported on 06/18/2015 06/11/12   Standley Brooking, MD   BP 117/62 mmHg  Pulse 79  Temp(Src) 98.8 F (37.1 C) (Oral)  Resp 18  Ht  (1.676 m)  Wt 222 lb 4.8 oz (100.835 kg)  BMI 35.90 kg/m2  SpO2 97% Physical Exam  Constitutional: She is oriented to person, place, and time. She appears well-developed and well-nourished. No distress.  HENT:  Head: Normocephalic and atraumatic.  Eyes: Pupils are equal, round, and reactive to light.  Neck: Normal range of motion.  Cardiovascular: Normal rate and intact distal pulses.   Pulmonary/Chest: No respiratory distress.  Abdominal: Normal appearance. She exhibits no distension. There is no tenderness. There is no rebound.  Musculoskeletal: Normal range of motion.  Neurological: She is alert and oriented to person, place, and time. No cranial nerve deficit.  Skin: Skin is warm and dry. No rash noted.  Psychiatric: She has a normal mood and affect. Her behavior is normal.  Nursing note and vitals reviewed.   ED Course  Procedures (including critical care time) Labs Review Labs Reviewed  BASIC METABOLIC PANEL -  Abnormal; Notable for the following:    Glucose, Bld 120 (*)    All other components within normal limits  CBC - Abnormal; Notable for the following:    WBC 11.7 (*)    RBC 5.40 (*)    All other components within normal limits  TROPONIN I    Imaging Review Dg Chest 2 View  06/18/2015  CLINICAL DATA:  Chest pain and shortness of breath. Complaining of generalized upper chest tightness for 2 weeks increasing tonight. Short of breath on exertion over the last week with some productive cough. EXAM: CHEST  2 VIEW COMPARISON:  06/03/2012 FINDINGS: The heart size and mediastinal contours are within normal  limits. Both lungs are clear. No pleural effusion or pneumothorax. Bony thorax is intact. IMPRESSION: No active cardiopulmonary disease. Electronically Signed   By: Amie Portland M.D.   On: 06/18/2015 19:33   I have personally reviewed and evaluated these images and lab results as part of my medical decision-making.   EKG Interpretation   Date/Time:  Thursday June 18 2015 21:57:45 EST Ventricular Rate:  79 PR Interval:  183 QRS Duration: 96 QT Interval:  403 QTC Calculation: 462 R Axis:   -22 Text Interpretation:  Sinus rhythm Borderline left axis deviation Low  voltage, precordial leads Borderline T abnormalities, anterior leads  Confirmed by Radford Pax  MD, Myles Tavella ([id number]) on 06/18/2015 11:10:56 PM      MDM   Final diagnoses:  Weakness        Nelva Nay, MD 06/18/15 2311

## 2015-06-18 NOTE — ED Notes (Signed)
Pt ready for discharge, MD made aware

## 2015-06-18 NOTE — Discharge Instructions (Signed)
Weakness °Weakness is a lack of strength. You may feel weak all over your body or just in one part of your body. Weakness can be serious. In some cases, you may need more medical tests. °HOME CARE °· Rest. °· Eat a well-balanced diet. °· Try to exercise every day. °· Only take medicines as told by your doctor. °GET HELP RIGHT AWAY IF:  °· You cannot do your normal daily activities. °· You cannot walk up and down stairs, or you feel very tired when you do so. °· You have shortness of breath or chest pain. °· You have trouble moving parts of your body. °· You have weakness in only one body part or on only one side of the body. °· You have a fever. °· You have trouble speaking or swallowing. °· You cannot control when you pee (urinate) or poop (bowel movement). °· You have black or bloody throw up (vomit) or poop. °· Your weakness gets worse or spreads to other body parts. °· You have new aches or pains. °MAKE SURE YOU:  °· Understand these instructions. °· Will watch your condition. °· Will get help right away if you are not doing well or get worse. °  °This information is not intended to replace advice given to you by your health care provider. Make sure you discuss any questions you have with your health care provider. °  °Document Released: 04/14/2008 Document Revised: 11/01/2011 Document Reviewed: 07/01/2011 °Elsevier Interactive Patient Education ©2016 [employer] Inc[employer] ° °

## 2015-08-25 ENCOUNTER — Other Ambulatory Visit: Payer: Self-pay

## 2015-08-25 DIAGNOSIS — Z1231 Encounter for screening mammogram for malignant neoplasm of breast: Secondary | ICD-10-CM

## 2015-10-02 ENCOUNTER — Ambulatory Visit
Admission: RE | Admit: 2015-10-02 | Discharge: 2015-10-02 | Disposition: A | Discharge status: Home / Self Care | Payer: Medicare Other | Source: Ambulatory Visit

## 2015-10-02 DIAGNOSIS — Z1231 Encounter for screening mammogram for malignant neoplasm of breast: Secondary | ICD-10-CM

## 2015-11-25 ENCOUNTER — Ambulatory Visit: Payer: Medicare Other | Attending: Internal Medicine | Admitting: Physical Therapy

## 2015-11-25 ENCOUNTER — Encounter: Payer: Self-pay | Admitting: Physical Therapy

## 2015-11-25 DIAGNOSIS — M25551 Pain in right hip: Secondary | ICD-10-CM | POA: Diagnosis not present

## 2015-11-25 DIAGNOSIS — M6281 Muscle weakness (generalized): Secondary | ICD-10-CM | POA: Insufficient documentation

## 2015-11-25 DIAGNOSIS — R262 Difficulty in walking, not elsewhere classified: Secondary | ICD-10-CM | POA: Insufficient documentation

## 2015-11-25 DIAGNOSIS — M25542 Pain in joints of left hand: Secondary | ICD-10-CM | POA: Insufficient documentation

## 2015-11-25 DIAGNOSIS — M25541 Pain in joints of right hand: Secondary | ICD-10-CM | POA: Diagnosis present

## 2015-11-25 NOTE — Patient Instructions (Signed)
Access Code: [email or web address]  URL: [email or web address]  Date: 11/25/2015  Prepared by: Army FossaJessica Azoria Abbett   Exercises  Standing Hip Abduction with Counter Support - 15 reps - 2 sets - 2 hold - 1x daily - 7x weekly  Standing Weight Shift Side to Side - 15 reps - 2 sets - 2 hold - 1x daily - 7x weekly  Standing Alternating Knee Flexion - 20 reps - 2 sets - 0 hold - 1x daily - 7x weekly

## 2015-11-25 NOTE — Therapy (Signed)
[facility] [address] [place], [place], [address] Phone: [phone]   Fax:  [phone]  Physical Therapy Evaluation  Patient Details  Name: Kimberly Sellers MRN: [id number] Date of Birth: 03/02/1946 Referring Provider: Willey BladeEric Dean, MD  Encounter Date: 11/25/2015      PT End of Session - 11/25/15 0930    Visit Number 1   Number of Visits 13   Date for PT Re-Evaluation 01/08/16   Authorization Type [employer] MCR  KX at visit 15   PT Start Time 0846   PT Stop Time 0933   PT Time Calculation (min) 47 min   Activity Tolerance Patient tolerated treatment well   Behavior During Therapy [facility] for tasks assessed/performed      Past Medical History  Diagnosis Date  . Hypertension   . Anxiety     Past Surgical History  Procedure Laterality Date  . Cholecystectomy      There were no vitals filed for this visit.       Subjective Assessment - 11/25/15 0852    Subjective Hands tighten up sometimes, do not stop her becuase she does some exercises for them. Fell in 2013 when she stepped on a bend in the floor. 2015 hip pain began presenting. She is able to do housework but it is painful. Pulling sensation sown posterior leg.    Limitations House hold activities;Standing;Walking   How long can you sit comfortably? unlimited with back support   How long can you walk comfortably? 20 min   Patient Stated Goals lay on R hip, return to walking program (45 min daily), yard work, helping elderly neighbors   Currently in Pain? Yes   Pain Score 8   10/10 this morning spot cleaning carpet   Pain Location Hip   Pain Orientation Right   Pain Descriptors / Indicators Sharp;Sore   Pain Type Chronic pain   Aggravating Factors  laying on hip, walking too much   Pain Relieving Factors topical pain relievers   Multiple Pain Sites Yes   Pain Score 7   Pain Location Hand  bilateral   Pain Orientation Right;Left   Pain Descriptors / Indicators  Aching   Aggravating Factors  scrubbing   Pain Relieving Factors exercises, rub alcohol and put on gloves, massaging            [facility] PT Assessment - 11/25/15 0001    Assessment   Medical Diagnosis OA bilateral hands, R hip pain   Referring Provider Willey BladeEric Dean, MD   Onset Date/Surgical Date --  2015   Hand Dominance Right   Next MD Visit Approx Oct, 2017   Prior Therapy no   Precautions   Precautions None   Restrictions   Weight Bearing Restrictions No   Balance Screen   Has the patient fallen in the past 6 months Yes   How many times? 1   Home Environment   Living Environment Private residence   Living Arrangements Alone   Type of Home House   Home Access Stairs to enter   Entrance Stairs-Number of Steps 5   Entrance Stairs-Rails Right;Left   Prior Function   Level of Independence Independent   Cognition   Sellers Cognitive Status Within Functional Limits for tasks assessed   Observation/Other Assessments   Other Surveys  Other Surveys   Lower Extremity Functional Scale  unable to determine score due to pt missing lines of questions(will attempt next visit)   Posture/Postural Control   Posture Comments L  trunk shift; hips drifted to R; L knee bent and L foot externally rotated   ROM / Strength   AROM / PROM / Strength Strength;AROM   Strength   Strength Assessment Site Hand;Hip   Right/Left hand Right;Left   Right Hand Grip (lbs) 45   Left Hand Grip (lbs) 50   Right/Left Hip Right;Left   Right Hip Flexion 4-/5   Right Hip Extension 3+/5  measured in supine   Right Hip ABduction 3-/5  unable to lift without rolling post to utilize quads   Left Hip Flexion 3+/5   Left Hip Extension 4/5  measured from supine   Left Hip ABduction 3/5  able to lift without compensation from quads only   Ambulation/Gait   Gait Comments flexed posture lacking trunk rotation; lacking hip extension; flat foot bilateral with external rotation noted; bilateral knee valgus                    [facility] Adult PT Treatment/Exercise - 11/25/15 0001    Exercises   Exercises Knee/Hip   Knee/Hip Exercises: Stretches   Passive Hamstring Stretch 2 reps;10 seconds   Knee/Hip Exercises: Standing   Knee Flexion 10 reps;Both   Hip Abduction 20 reps;Both   Other Standing Knee Exercises lateral weight shifts with glut squeeze                PT Education - 11/25/15 1253    Education provided Yes   Education Details anatomy of condition, POC, HEP   Person(s) Educated Patient   Methods Explanation;Demonstration;Tactile cues;Verbal cues;Handout   Comprehension Verbalized understanding;Returned demonstration;Verbal cues required;Tactile cues required;Need further instruction          PT Short Term Goals - 11/25/15 1310    PT SHORT TERM GOAL #1   Title pt will be independent with HEP as it has been established by 8/4   Time 3   Period Weeks   Status New   PT SHORT TERM GOAL #2   Title Pt will be able to demonstrate appropriate activation of glut med by performing 10 sidelying hip abduction without compensation from quadriceps   Time 3   Period Weeks   Status New           PT Long Term Goals - 11/25/15 1311    PT LONG TERM GOAL #1   Title Pt will be able to ambulate for 45 min to return to exercise and safely navigate around the community   Time 6   Period Weeks   Status New   PT LONG TERM GOAL #2   Title Pt will demonstrate 5lb grip strength increase bilaterally to indicate improved functional grip for lifting and other household chores   Time 6   Period Weeks   Status New   PT LONG TERM GOAL #3   Title Pt will verbalize decreased pain and improved strength enough to safely return to aiding elderly neighbors   Time 6   Period Weeks   Status New   PT LONG TERM GOAL #4   Title Pt will demontrate gait pattern with hip extension and appropriate heel toe pattern bilaterally   Time 6   Period Weeks   Status New                Plan - 11/25/15 1256    Clinical Impression Statement Pt presents to PT today with complaints of pain in bilateral hands as well as R hip. See flowsheet for outline on posture and  gait pattern. Pt willl benefit from skilled PT in order to address gait and postural deviations by challenging weaknesses as well as reach functional LTGs.    Rehab Potential Good   PT Frequency 2x / week   PT Duration 6 weeks   PT Treatment/Interventions ADLs/Self Care Home Management;Cryotherapy;Electrical Stimulation;Ultrasound;Traction;Moist Heat;Iontophoresis /ml Dexamethasone;Balance training;Therapeutic exercise;Therapeutic activities;Functional mobility training;Stair training;Gait training;Neuromuscular re-education;Patient/family education;Manual techniques;Passive range of motion;Dry needling;Taping   PT Next Visit Plan nu step, hip strengthening, putty for grip, REdo LEFS- cues to complete each line   PT Home Exercise Plan standing hip abd, lateral weight shifts, standing HS curls   Consulted and Agree with Plan of Care Patient      Patient will benefit from skilled therapeutic intervention in order to improve the following deficits and impairments:  Abnormal gait, Improper body mechanics, Pain, Postural dysfunction, Decreased strength, Difficulty walking  Visit Diagnosis: Pain in right hip - Plan: PT plan of care cert/re-cert  Pain in joints of right hand - Plan: PT plan of care cert/re-cert  Pain in joints of left hand - Plan: PT plan of care cert/re-cert  Difficulty in walking, not elsewhere classified - Plan: PT plan of care cert/re-cert  Muscle weakness (generalized) - Plan: PT plan of care cert/re-cert      G-Codes - Dec 02, 2015 1315    Functional Assessment Tool Used clinical judgement, LEFS to be completed next visit   Functional Limitation Mobility: Walking and moving around   Mobility: Walking and Moving Around Current Status (R6045) At least 60 percent but less than 80 percent  impaired, limited or restricted   Mobility: Walking and Moving Around Goal Status (W0981) At least 40 percent but less than 60 percent impaired, limited or restricted       Problem List Patient Active Problem List   Diagnosis Date Noted  . Supraventricular tachycardia (HCC) 06/10/2012  . Hypokalemia 06/09/2012  . Partial small bowel obstruction (HCC) 06/07/2012  . Sepsis (HCC) 06/03/2012  . Lactic acidosis 06/02/2012  . Abdominal pain 06/01/2012  . Nausea & vomiting 06/01/2012  . Body aches 06/01/2012  . Flu-like symptoms 06/01/2012  . Ileus (HCC) 06/01/2012  . HTN (hypertension) 06/01/2012  . Adrenal nodule (HCC) 06/01/2012    Kimberly Sellers PT, DPT 2015/12/02 1:22 PM   [facility] [address] [place], [place], [address] Phone: [phone]   Fax:  [phone]  Name: Kimberly Sellers MRN: [id number] Date of Birth: 01/21/1946

## 2015-12-01 ENCOUNTER — Ambulatory Visit: Payer: Medicare Other | Admitting: Physical Therapy

## 2015-12-01 DIAGNOSIS — M6281 Muscle weakness (generalized): Secondary | ICD-10-CM

## 2015-12-01 DIAGNOSIS — M25541 Pain in joints of right hand: Secondary | ICD-10-CM

## 2015-12-01 DIAGNOSIS — M25551 Pain in right hip: Secondary | ICD-10-CM | POA: Diagnosis not present

## 2015-12-01 NOTE — Patient Instructions (Addendum)
  From exercise drawer: 1-2 x a day 3-5 X each,  10 to 30 second holds.  Hamstring stretch, piriformis stretch, McKenzie lateral techniques   Hand exercises and Joint protection, 5-10 x each 1-2 x a day.
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
## 2015-12-04 ENCOUNTER — Encounter: Payer: Self-pay | Admitting: Physical Therapy

## 2015-12-04 ENCOUNTER — Ambulatory Visit: Payer: Medicare Other | Admitting: Physical Therapy

## 2015-12-04 DIAGNOSIS — M25551 Pain in right hip: Secondary | ICD-10-CM | POA: Diagnosis not present

## 2015-12-04 DIAGNOSIS — R262 Difficulty in walking, not elsewhere classified: Secondary | ICD-10-CM

## 2015-12-04 DIAGNOSIS — M6281 Muscle weakness (generalized): Secondary | ICD-10-CM

## 2015-12-04 DIAGNOSIS — M25542 Pain in joints of left hand: Secondary | ICD-10-CM

## 2015-12-04 DIAGNOSIS — M25541 Pain in joints of right hand: Secondary | ICD-10-CM

## 2015-12-04 NOTE — Patient Instructions (Signed)
Access Code: [email or web address]  URL: [email or web address]  Date: 12/04/2015  Prepared by: Army FossaJessica Janira Mandell   Exercises  Bridge with Hip Abduction and Resistance - 30 reps - 1 sets - 5 hold - 2x daily - 7x weekly  Supine Lower Trunk Rotation - 20 reps - 2x daily - 7x weekly  Supine Piriformis Stretch with Foot on Ground - 3 reps - 1 sets - 30s hold - 1x daily - 7x weekly

## 2015-12-04 NOTE — Therapy (Signed)
[facility] [address] [place], [place], [address] Phone: [phone]   Fax:  [phone]  Physical Therapy Treatment  Patient Details  Name: Kimberly Sellers MRN: [id number] Date of Birth: 02/28/1946 Referring Provider: Willey BladeEric Dean, MD  Encounter Date: 12/04/2015      PT End of Session - 12/04/15 0900    Visit Number 3   Number of Visits 13   Date for PT Re-Evaluation 01/08/16   Authorization Type UHC MCR  KX at visit 15   PT Start Time [phone]  pt arrived late   PT Stop Time 0930   PT Time Calculation (min) 35 min   Activity Tolerance Patient tolerated treatment well   Behavior During Therapy [facility] for tasks assessed/performed      Past Medical History  Diagnosis Date  . Hypertension   . Anxiety     Past Surgical History  Procedure Laterality Date  . Cholecystectomy      There were no vitals filed for this visit.      Subjective Assessment - 12/04/15 0858    Subjective Pt reports it is about the same, a little better at times. Hand exercises are helping.    Pain Score 6   8/10 when first waking   Pain Location Hip   Pain Orientation Right   Pain Descriptors / Indicators Sore   Pain Score 8   Pain Location Hand   Pain Orientation Right;Left   Pain Descriptors / Indicators Aching                         [facility] Adult PT Treatment/Exercise - 12/04/15 0001    Lumbar Exercises: Stretches   Lower Trunk Rotation Other (comment)  x10 each direction   Piriformis Stretch 3 reps;10 seconds   Knee/Hip Exercises: Aerobic   Nustep 5 min L4   Knee/Hip Exercises: Standing   Side Lunges Limitations L hip tap to wall, PT holding red tband   Other Standing Knee Exercises R foot on step hip hike for L hip strength   Knee/Hip Exercises: Supine   Bridges with Clamshell 20 reps;10 reps  red tband   Other Supine Knee/Hip Exercises hooklying clam red tband   Knee/Hip Exercises: Sidelying   Clams x30                 PT Education - 12/04/15 0900    Education provided Yes   Education Details exercise form/rationale   Person(s) Educated Patient   Methods Explanation;Demonstration;Tactile cues;Verbal cues   Comprehension Verbalized understanding;Returned demonstration;Verbal cues required;Tactile cues required;Need further instruction          PT Short Term Goals - 12/01/15 1152    PT SHORT TERM GOAL #1   Title pt will be independent with HEP as it has been established by 8/4   Time 3   Period Weeks   Status On-going   PT SHORT TERM GOAL #2   Title Pt will be able to demonstrate appropriate activation of glut med by performing 10 sidelying hip abduction without compensation from quadriceps   Baseline cues   Time 3   Period Weeks   Status On-going           PT Long Term Goals - 11/25/15 1311    PT LONG TERM GOAL #1   Title Pt will be able to ambulate for 45 min to return to exercise and safely navigate around the community   Time 6   Period  Weeks   Status New   PT LONG TERM GOAL #2   Title Pt will demonstrate 5lb grip strength increase bilaterally to indicate improved functional grip for lifting and other household chores   Time 6   Period Weeks   Status New   PT LONG TERM GOAL #3   Title Pt will verbalize decreased pain and improved strength enough to safely return to aiding elderly neighbors   Time 6   Period Weeks   Status New   PT LONG TERM GOAL #4   Title Pt will demontrate gait pattern with hip extension and appropriate heel toe pattern bilaterally   Time 6   Period Weeks   Status New               Plan - 12/04/15 0931    Clinical Impression Statement Pt reported feeling better after session and verbalized feeling that exercises are helping her.   PT Next Visit Plan [facility] hip strengthening   PT Home Exercise Plan hand/wrist, piriformis, hamstring stretches, bridges, clams, LTR   Consulted and Agree with Plan of Care Patient      Patient  will benefit from skilled therapeutic intervention in order to improve the following deficits and impairments:     Visit Diagnosis: Pain in right hip  Pain in joints of right hand  Muscle weakness (generalized)  Pain in joints of left hand  Difficulty in walking, not elsewhere classified     Problem List Patient Active Problem List   Diagnosis Date Noted  . Supraventricular tachycardia (HCC) 06/10/2012  . Hypokalemia 06/09/2012  . Partial small bowel obstruction (HCC) 06/07/2012  . Sepsis (HCC) 06/03/2012  . Lactic acidosis 06/02/2012  . Abdominal pain 06/01/2012  . Nausea & vomiting 06/01/2012  . Body aches 06/01/2012  . Flu-like symptoms 06/01/2012  . Ileus (HCC) 06/01/2012  . HTN (hypertension) 06/01/2012  . Adrenal nodule (HCC) 06/01/2012    Kimberly Sellers PT, DPT 12/04/2015 10:16 AM   [facility] [address] [place], [place], [address] Phone: [phone]   Fax:  [phone]  Name: Kimberly Sellers MRN: [id number] Date of Birth: 09-09-1945

## 2015-12-07 ENCOUNTER — Ambulatory Visit: Payer: Medicare Other | Admitting: Physical Therapy

## 2015-12-07 ENCOUNTER — Encounter: Payer: Self-pay | Admitting: Physical Therapy

## 2015-12-07 VITALS — BP 124/84

## 2015-12-07 DIAGNOSIS — R262 Difficulty in walking, not elsewhere classified: Secondary | ICD-10-CM

## 2015-12-07 DIAGNOSIS — M6281 Muscle weakness (generalized): Secondary | ICD-10-CM

## 2015-12-07 DIAGNOSIS — M25541 Pain in joints of right hand: Secondary | ICD-10-CM

## 2015-12-07 DIAGNOSIS — M25551 Pain in right hip: Secondary | ICD-10-CM | POA: Diagnosis not present

## 2015-12-07 DIAGNOSIS — M25542 Pain in joints of left hand: Secondary | ICD-10-CM

## 2015-12-07 NOTE — Therapy (Signed)
[facility] [address] [place], [place], [address] Phone: [phone]   Fax:  [phone]  Physical Therapy Treatment  Patient Details  Name: Kimberly Sellers MRN: [id number] Date of Birth: 07-Nov-1945 Referring Provider: Willey Blade, MD  Encounter Date: 12/07/2015      PT End of Session - 12/07/15 0940    Visit Number 4   Number of Visits 13   Date for PT Re-Evaluation 01/08/16   Authorization Type UHC MCR  KX at visit 15   PT Start Time 0932   PT Stop Time 1015   PT Time Calculation (min) 43 min   Activity Tolerance Patient tolerated treatment well   Behavior During Therapy [facility] for tasks assessed/performed      Past Medical History:  Diagnosis Date  . Anxiety   . Hypertension     Past Surgical History:  Procedure Laterality Date  . CHOLECYSTECTOMY      Vitals:   12/07/15 1024  BP: 124/84        Subjective Assessment - 12/07/15 0937    Subjective Verbalizes compliance with HEP and is feeling pretty good. Went for a 20 min walk this morning, helped decrease pain a little. Hand is feeling a lot better with increased use.    Patient Stated Goals lay on R hip, return to walking program (45 min daily), yard work, helping elderly neighbors   Pain Score 6   7/10 upon waking   Pain Location Hip   Pain Orientation Right   Pain Descriptors / Indicators Tightness   Pain Score 4   Pain Location Hand   Pain Orientation Right;Left                         [facility] Adult PT Treatment/Exercise - 12/07/15 0001      Lumbar Exercises: Stretches   Piriformis Stretch 2 reps;30 seconds     Knee/Hip Exercises: Aerobic   Nustep 10 min L4     Knee/Hip Exercises: Standing   Gait Training incr step width fwd and retro   Other Standing Knee Exercises R foot on step hip hike for L hip strength ( and opposite)   Other Standing Knee Exercises wide tandem weight shift     Knee/Hip Exercises: Supine   Bridges with  Clamshell Other (comment)  2 min, 5s holds green tband   Other Supine Knee/Hip Exercises hooklying clam green tband                PT Education - 12/07/15 0940    Education provided Yes   Education Details exercise form/rationale   Person(s) Educated Patient   Methods Explanation;Demonstration;Tactile cues;Verbal cues   Comprehension Verbalized understanding;Returned demonstration;Verbal cues required;Tactile cues required;Need further instruction          PT Short Term Goals - 12/01/15 1152      PT SHORT TERM GOAL #1   Title pt will be independent with HEP as it has been established by 8/4   Time 3   Period Weeks   Status On-going     PT SHORT TERM GOAL #2   Title Pt will be able to demonstrate appropriate activation of glut med by performing 10 sidelying hip abduction without compensation from quadriceps   Baseline cues   Time 3   Period Weeks   Status On-going           PT Long Term Goals - 11/25/15 1311      PT LONG  TERM GOAL #1   Title Pt will be able to ambulate for 45 min to return to exercise and safely navigate around the community   Time 6   Period Weeks   Status New     PT LONG TERM GOAL #2   Title Pt will demonstrate 5lb grip strength increase bilaterally to indicate improved functional grip for lifting and other household chores   Time 6   Period Weeks   Status New     PT LONG TERM GOAL #3   Title Pt will verbalize decreased pain and improved strength enough to safely return to aiding elderly neighbors   Time 6   Period Weeks   Status New     PT LONG TERM GOAL #4   Title Pt will demontrate gait pattern with hip extension and appropriate heel toe pattern bilaterally   Time 6   Period Weeks   Status New               Plan - 12/07/15 1026    Clinical Impression Statement Heavy verbal, tactile and visual cuing required to perform exercises appropriately. Significant weakness in hips and poor self-awareness for limb control  without cuing. Challenged pt to increase step witdth to increase balance as well as promote activation of hip abductor musculature.    Rehab Potential Good   PT Frequency 2x / week   PT Duration 6 weeks   PT Treatment/Interventions ADLs/Self Care Home Management;Cryotherapy;Electrical Stimulation;Ultrasound;Traction;Moist Heat;Iontophoresis /ml Dexamethasone;Balance training;Therapeutic exercise;Therapeutic activities;Functional mobility training;Stair training;Gait training;Neuromuscular re-education;Patient/family education;Manual techniques;Passive range of motion;Dry needling;Taping   PT Next Visit Plan [facility] hip strengthening, gait training, abductor/external rotator activation in functional positions.    PT Home Exercise Plan hand/wrist, piriformis, hamstring stretches, bridges, clams, LTR; wide step width   Consulted and Agree with Plan of Care Patient      Patient will benefit from skilled therapeutic intervention in order to improve the following deficits and impairments:  Abnormal gait, Improper body mechanics, Pain, Postural dysfunction, Decreased strength, Difficulty walking  Visit Diagnosis: Pain in right hip  Pain in joints of right hand  Muscle weakness (generalized)  Pain in joints of left hand  Difficulty in walking, not elsewhere classified     Problem List Patient Active Problem List   Diagnosis Date Noted  . Supraventricular tachycardia (HCC) 06/10/2012  . Hypokalemia 06/09/2012  . Partial small bowel obstruction (HCC) 06/07/2012  . Sepsis (HCC) 06/03/2012  . Lactic acidosis 06/02/2012  . Abdominal pain 06/01/2012  . Nausea & vomiting 06/01/2012  . Body aches 06/01/2012  . Flu-like symptoms 06/01/2012  . Ileus (HCC) 06/01/2012  . HTN (hypertension) 06/01/2012  . Adrenal nodule (HCC) 06/01/2012    Leonor Darnell C. Constance Whittle PT, DPT 12/07/15 10:32 AM   [facility] [address] [place], [place],  [address] Phone: [phone]   Fax:  [phone]  Name: Kimberly Sellers MRN: [id number] Date of Birth: February 15, 1946

## 2015-12-10 ENCOUNTER — Ambulatory Visit: Payer: Medicare Other | Admitting: Physical Therapy

## 2015-12-10 ENCOUNTER — Encounter: Payer: Self-pay | Admitting: Physical Therapy

## 2015-12-10 DIAGNOSIS — M25542 Pain in joints of left hand: Secondary | ICD-10-CM

## 2015-12-10 DIAGNOSIS — M6281 Muscle weakness (generalized): Secondary | ICD-10-CM

## 2015-12-10 DIAGNOSIS — M25551 Pain in right hip: Secondary | ICD-10-CM

## 2015-12-10 DIAGNOSIS — M25541 Pain in joints of right hand: Secondary | ICD-10-CM

## 2015-12-10 DIAGNOSIS — R262 Difficulty in walking, not elsewhere classified: Secondary | ICD-10-CM

## 2015-12-10 NOTE — Therapy (Signed)
[facility] [address] [place], [place], [address] Phone: [phone]   Fax:  [phone]  Physical Therapy Treatment  Patient Details  Name: Kimberly Sellers MRN: [id number] Date of Birth: [birth year] Referring Provider: Willey Blade, MD  Encounter Date: 12/10/2015      PT End of Session - 12/10/15 1016    Visit Number 5   Number of Visits 13   Date for PT Re-Evaluation 01/08/16   Authorization Type UHC MCR  KX at visit 15   PT Start Time 1016   PT Stop Time 1100   PT Time Calculation (min) 44 min   Activity Tolerance Patient tolerated treatment well   Behavior During Therapy [facility] for tasks assessed/performed      Past Medical History:  Diagnosis Date  . Anxiety   . Hypertension     Past Surgical History:  Procedure Laterality Date  . CHOLECYSTECTOMY      There were no vitals filed for this visit.      Subjective Assessment - 12/10/15 1018    Subjective Went and walked this morning for about 20 min and began having back soreness. Rubbed legs with alcohol and then continued with her day.    Currently in Pain? Yes   Pain Score 5    Pain Location Hip   Pain Orientation Right   Pain Descriptors / Indicators --  stiffness   Pain Type Chronic pain   Aggravating Factors  laying on hip (occasionally), "overdoing"                         [facility] Adult PT Treatment/Exercise - 12/10/15 0001      Knee/Hip Exercises: Stretches   Passive Hamstring Stretch 30 seconds;Both   Passive Hamstring Stretch Limitations seated EOB with green strap   Piriformis Stretch 2 reps;10 seconds   Gastroc Stretch 30 seconds   Gastroc Stretch Limitations slant board     Knee/Hip Exercises: Aerobic   Nustep 10 min L4     Knee/Hip Exercises: Standing   Heel Raises 20 reps;10 reps   Heel Raises Limitations back againts wall   Side Lunges 15 reps;Both   Side Lunges Limitations with red plyoball press out   Hip Extension 10  reps;Both   Extension Limitations elbows on counter with knee flexed     Knee/Hip Exercises: Supine   Bridges with Clamshell 20 reps  red tband   Straight Leg Raises 20 reps;Both   Other Supine Knee/Hip Exercises small SLR with abduction x10 ea     Knee/Hip Exercises: Sidelying   Clams x30     Manual Therapy   Manual therapy comments roller R hip                PT Education - 12/10/15 1021    Education provided Yes   Education Details exercise form/rationale   Person(s) Educated Patient   Methods Explanation;Demonstration;Tactile cues;Verbal cues   Comprehension Verbalized understanding;Returned demonstration;Verbal cues required;Tactile cues required;Need further instruction          PT Short Term Goals - 12/01/15 1152      PT SHORT TERM GOAL #1   Title pt will be independent with HEP as it has been established by 8/4   Time 3   Period Weeks   Status On-going     PT SHORT TERM GOAL #2   Title Pt will be able to demonstrate appropriate activation of glut med by performing 10 sidelying hip abduction  without compensation from quadriceps   Baseline cues   Time 3   Period Weeks   Status On-going           PT Long Term Goals - 11/25/15 1311      PT LONG TERM GOAL #1   Title Pt will be able to ambulate for 45 min to return to exercise and safely navigate around the community   Time 6   Period Weeks   Status New     PT LONG TERM GOAL #2   Title Pt will demonstrate 5lb grip strength increase bilaterally to indicate improved functional grip for lifting and other household chores   Time 6   Period Weeks   Status New     PT LONG TERM GOAL #3   Title Pt will verbalize decreased pain and improved strength enough to safely return to aiding elderly neighbors   Time 6   Period Weeks   Status New     PT LONG TERM GOAL #4   Title Pt will demontrate gait pattern with hip extension and appropriate heel toe pattern bilaterally   Time 6   Period Weeks    Status New               Plan - 12/10/15 1249    Clinical Impression Statement Notable weakness continues in R hip along with valgus in L knee resulting in poor biomechanical chain posture in gait. Will continue to benefit from strengthening exercises to decrease hip pain.    PT Next Visit Plan [facility] hip strengthening, gait training, abductor/external rotator activation in functional positions.    Consulted and Agree with Plan of Care Patient      Patient will benefit from skilled therapeutic intervention in order to improve the following deficits and impairments:     Visit Diagnosis: Pain in right hip  Pain in joints of right hand  Muscle weakness (generalized)  Pain in joints of left hand  Difficulty in walking, not elsewhere classified     Problem List Patient Active Problem List   Diagnosis Date Noted  . Supraventricular tachycardia (HCC) 06/10/2012  . Hypokalemia 06/09/2012  . Partial small bowel obstruction (HCC) 06/07/2012  . Sepsis (HCC) 06/03/2012  . Lactic acidosis 06/02/2012  . Abdominal pain 06/01/2012  . Nausea & vomiting 06/01/2012  . Body aches 06/01/2012  . Flu-like symptoms 06/01/2012  . Ileus (HCC) 06/01/2012  . HTN (hypertension) 06/01/2012  . Adrenal nodule (HCC) 06/01/2012   Tenise Stetler C. Teairra Millar PT, DPT 12/10/15 12:52 PM  [facility] [address] [place], [place], [address] Phone: [phone]   Fax:  [phone]  Name: Kimberly Sellers MRN: [id number] Date of Birth: 10/05/1945

## 2015-12-14 ENCOUNTER — Ambulatory Visit: Payer: Medicare Other | Admitting: Physical Therapy

## 2015-12-14 ENCOUNTER — Encounter: Payer: Self-pay | Admitting: Physical Therapy

## 2015-12-14 VITALS — BP 124/80

## 2015-12-14 DIAGNOSIS — M25541 Pain in joints of right hand: Secondary | ICD-10-CM

## 2015-12-14 DIAGNOSIS — R262 Difficulty in walking, not elsewhere classified: Secondary | ICD-10-CM

## 2015-12-14 DIAGNOSIS — M6281 Muscle weakness (generalized): Secondary | ICD-10-CM

## 2015-12-14 DIAGNOSIS — M25542 Pain in joints of left hand: Secondary | ICD-10-CM

## 2015-12-14 DIAGNOSIS — M25551 Pain in right hip: Secondary | ICD-10-CM | POA: Diagnosis not present

## 2015-12-14 NOTE — Therapy (Signed)
[facility] [address] [place], [place], [address] Phone: [phone]   Fax:  [phone]  Physical Therapy Treatment  Patient Details  Name: Kimberly Sellers MRN: [id number] Date of Birth: [birth year] Referring Provider: Willey Blade, MD  Encounter Date: 12/14/2015      PT End of Session - 12/14/15 0925    Visit Number 6   Number of Visits 13   Date for PT Re-Evaluation 01/08/16   Authorization Type UHC MCR  KX at visit 15   PT Start Time 0833   PT Stop Time 0920   PT Time Calculation (min) 47 min   Activity Tolerance Patient tolerated treatment well   Behavior During Therapy [facility] for tasks assessed/performed      Past Medical History:  Diagnosis Date  . Anxiety   . Hypertension     Past Surgical History:  Procedure Laterality Date  . CHOLECYSTECTOMY      Vitals:   12/14/15 0927  BP: 124/80        Subjective Assessment - 12/14/15 0924    Subjective pt reports her R shoulder was bothering her yesterday. Was up at church at 6 this morning so she is a little sleepy. Reports her hip is feeling a little better   Currently in Pain? Yes   Pain Score 5    Pain Location Hip   Pain Orientation Right   Pain Type Chronic pain                                 PT Education - 12/14/15 0925    Education provided Yes   Education Details exercise form/rationale   Person(s) Educated Patient   Methods Explanation;Demonstration;Tactile cues;Verbal cues   Comprehension Verbalized understanding;Returned demonstration;Verbal cues required;Tactile cues required;Need further instruction          PT Short Term Goals - 12/01/15 1152      PT SHORT TERM GOAL #1   Title pt will be independent with HEP as it has been established by 8/4   Time 3   Period Weeks   Status On-going     PT SHORT TERM GOAL #2   Title Pt will be able to demonstrate appropriate activation of glut med by performing 10 sidelying  hip abduction without compensation from quadriceps   Baseline cues   Time 3   Period Weeks   Status On-going           PT Long Term Goals - 11/25/15 1311      PT LONG TERM GOAL #1   Title Pt will be able to ambulate for 45 min to return to exercise and safely navigate around the community   Time 6   Period Weeks   Status New     PT LONG TERM GOAL #2   Title Pt will demonstrate 5lb grip strength increase bilaterally to indicate improved functional grip for lifting and other household chores   Time 6   Period Weeks   Status New     PT LONG TERM GOAL #3   Title Pt will verbalize decreased pain and improved strength enough to safely return to aiding elderly neighbors   Time 6   Period Weeks   Status New     PT LONG TERM GOAL #4   Title Pt will demontrate gait pattern with hip extension and appropriate heel toe pattern bilaterally   Time 6   Period Weeks  Status New               Plan - 12/14/15 3244    Clinical Impression Statement Pt cont to demo LLE valgus placing excessive strain on R hip. Cues required for upright posture and functional activation of gluts.    PT Next Visit Plan check STGs. [facility] hip strengthening, gait training, abductor/external rotator activation in functional positions.       Patient will benefit from skilled therapeutic intervention in order to improve the following deficits and impairments:     Visit Diagnosis: Pain in right hip  Pain in joints of right hand  Muscle weakness (generalized)  Pain in joints of left hand  Difficulty in walking, not elsewhere classified     Problem List Patient Active Problem List   Diagnosis Date Noted  . Supraventricular tachycardia (HCC) 06/10/2012  . Hypokalemia 06/09/2012  . Partial small bowel obstruction (HCC) 06/07/2012  . Sepsis (HCC) 06/03/2012  . Lactic acidosis 06/02/2012  . Abdominal pain 06/01/2012  . Nausea & vomiting 06/01/2012  . Body aches 06/01/2012  . Flu-like symptoms  06/01/2012  . Ileus (HCC) 06/01/2012  . HTN (hypertension) 06/01/2012  . Adrenal nodule (HCC) 06/01/2012    Kimberly Sellers C. Collen Hostler PT, DPT 12/14/15 9:28 AM   [facility] Out[facility] Rehabilitation [facility] [address] [place], [place], [address] Phone: [phone]   Fax:  [phone]  Name: Kimberly Sellers MRN: [id number] Date of Birth: 09/18/1945

## 2015-12-18 ENCOUNTER — Ambulatory Visit: Payer: Medicare Other | Attending: Internal Medicine | Admitting: Physical Therapy

## 2015-12-18 ENCOUNTER — Encounter: Payer: Self-pay | Admitting: Physical Therapy

## 2015-12-18 DIAGNOSIS — M25542 Pain in joints of left hand: Secondary | ICD-10-CM | POA: Insufficient documentation

## 2015-12-18 DIAGNOSIS — R262 Difficulty in walking, not elsewhere classified: Secondary | ICD-10-CM | POA: Diagnosis present

## 2015-12-18 DIAGNOSIS — M25541 Pain in joints of right hand: Secondary | ICD-10-CM | POA: Diagnosis present

## 2015-12-18 DIAGNOSIS — M6281 Muscle weakness (generalized): Secondary | ICD-10-CM | POA: Insufficient documentation

## 2015-12-18 DIAGNOSIS — M25551 Pain in right hip: Secondary | ICD-10-CM | POA: Diagnosis present

## 2015-12-18 NOTE — Therapy (Signed)
[facility] [address] [place], [place], [address] Phone: [phone]   Fax:  [phone]  Physical Therapy Treatment  Patient Details  Name: Kimberly Sellers MRN: [id number] Date of Birth: 1945/11/05 Referring Provider: Willey Blade, MD  Encounter Date: 12/18/2015      PT End of Session - 12/18/15 1018    Visit Number 7   Number of Visits 13   Date for PT Re-Evaluation 01/08/16   Authorization Type UHC MCR  KX at visit 15   PT Start Time 1016   PT Stop Time 1057   PT Time Calculation (min) 41 min   Activity Tolerance Patient tolerated treatment well   Behavior During Therapy [facility] for tasks assessed/performed      Past Medical History:  Diagnosis Date  . Anxiety   . Hypertension     Past Surgical History:  Procedure Laterality Date  . CHOLECYSTECTOMY      There were no vitals filed for this visit.      Subjective Assessment - 12/18/15 1019    Subjective Pt reports doing a little more walking than normal since the weather is so nice and is feeling a little extra stiff today. Some pain along R IT band   Patient Stated Goals lay on R hip, return to walking program (45 min daily), yard work, helping elderly neighbors   Currently in Pain? Yes   Pain Score 4    Pain Location Hip   Pain Orientation Right   Aggravating Factors  walking for long periods   Pain Relieving Factors rest            [facility] PT Assessment - 12/18/15 0001      Strength   Right Hip Flexion 4/5   Right Hip Extension 4-/5  meas in supine   Right Hip ABduction 3+/5   Left Hip Flexion 4-/5   Left Hip Extension 4/5  meas in supine   Left Hip ABduction 3+/5                     [facility] Adult PT Treatment/Exercise - 12/18/15 0001      Knee/Hip Exercises: Stretches   Passive Hamstring Stretch 30 seconds;Both   Passive Hamstring Stretch Limitations seated EOB with green strap   Piriformis Stretch 30 seconds;Both   Gastroc Stretch  30 seconds   Gastroc Stretch Limitations slant board     Knee/Hip Exercises: Aerobic   Nustep 10 min L4     Knee/Hip Exercises: Standing   Side Lunges 15 reps;Both   Side Lunges Limitations with red plyoball press out   Hip Extension 20 reps;Both   Extension Limitations standing at counter   Wall Squat 10 reps   Wall Squat Limitations red tband around knees     Knee/Hip Exercises: Supine   Bridges Limitations x10 R foot on airex, x10 L   Bridges with Clamshell 10 reps  green tband     Knee/Hip Exercises: Sidelying   Hip ABduction 15 reps                PT Education - 12/18/15 1021    Education provided Yes   Education Details exercise form/rationale   Person(s) Educated Patient   Methods Explanation;Demonstration;Tactile cues;Verbal cues   Comprehension Verbalized understanding;Returned demonstration;Verbal cues required;Tactile cues required;Need further instruction          PT Short Term Goals - 12/18/15 1021      PT SHORT TERM GOAL #1   Title  pt will be independent with HEP as it has been established by 8/4   Status Achieved     PT SHORT TERM GOAL #2   Title Pt will be able to demonstrate appropriate activation of glut med by performing 10 sidelying hip abduction without compensation from quadriceps   Status Achieved           PT Long Term Goals - 12/18/15 1030      PT LONG TERM GOAL #1   Title Pt will be able to ambulate for 45 min to return to exercise and safely navigate around the community   Baseline 20 min (give or take 15-20 depending on the day) on 8/4   Status On-going     PT LONG TERM GOAL #2   Title Pt will demonstrate 5lb grip strength increase bilaterally to indicate improved functional grip for lifting and other household chores   Baseline R 50lb L 45lb   Status On-going     PT LONG TERM GOAL #3   Title Pt will verbalize decreased pain and improved strength enough to safely return to aiding elderly neighbors   Baseline improving  a little bit but still does not feel strong enough for everything   Status On-going     PT LONG TERM GOAL #4   Title Pt will demontrate gait pattern with hip extension and appropriate heel toe pattern bilaterally   Baseline improved ext, just past neutral bilat, cont to ambulate with flexed posture, decreased antalgic gait/hip drop   Status On-going               Plan - 12/18/15 1057    Clinical Impression Statement Is making good progress toward goals and will continue to benefit from skilled PT to continue strengthening and gait training to meet long term functional goals   PT Next Visit Plan [facility] hip strengthening, gait training, abductor/external rotator activation in functional positions.    Consulted and Agree with Plan of Care Patient      Patient will benefit from skilled therapeutic intervention in order to improve the following deficits and impairments:     Visit Diagnosis: Pain in right hip  Pain in joints of right hand  Muscle weakness (generalized)  Pain in joints of left hand  Difficulty in walking, not elsewhere classified     Problem List Patient Active Problem List   Diagnosis Date Noted  . Supraventricular tachycardia (HCC) 06/10/2012  . Hypokalemia 06/09/2012  . Partial small bowel obstruction (HCC) 06/07/2012  . Sepsis (HCC) 06/03/2012  . Lactic acidosis 06/02/2012  . Abdominal pain 06/01/2012  . Nausea & vomiting 06/01/2012  . Body aches 06/01/2012  . Flu-like symptoms 06/01/2012  . Ileus (HCC) 06/01/2012  . HTN (hypertension) 06/01/2012  . Adrenal nodule (HCC) 06/01/2012    Ladonne Sharples C. Naliyah Neth PT, DPT 12/18/15 10:59 AM   [facility] [address] [place], [place], [address] Phone: [phone]   Fax:  [phone]  Name: Kimberly Sellers MRN: [id number] Date of Birth: 1946-02-15

## 2015-12-22 ENCOUNTER — Ambulatory Visit: Payer: Medicare Other | Admitting: Physical Therapy

## 2015-12-22 ENCOUNTER — Encounter: Payer: Self-pay | Admitting: Physical Therapy

## 2015-12-22 VITALS — BP 124/78

## 2015-12-22 DIAGNOSIS — M25551 Pain in right hip: Secondary | ICD-10-CM

## 2015-12-22 DIAGNOSIS — M6281 Muscle weakness (generalized): Secondary | ICD-10-CM

## 2015-12-22 DIAGNOSIS — M25542 Pain in joints of left hand: Secondary | ICD-10-CM

## 2015-12-22 DIAGNOSIS — M25541 Pain in joints of right hand: Secondary | ICD-10-CM

## 2015-12-22 DIAGNOSIS — R262 Difficulty in walking, not elsewhere classified: Secondary | ICD-10-CM

## 2015-12-22 NOTE — Therapy (Signed)
[facility] [address] [place], [place], [address] Phone: [phone]   Fax:  [phone]  Physical Therapy Treatment  Patient Details  Name: Kimberly OverallCarolyn Cantera MRN: [id number] Date of Birth: 06/28/1945 Referring Provider: Willey BladeEric Dean, MD  Encounter Date: 12/22/2015      PT End of Session - 12/22/15 0805    Visit Number 8   Number of Visits 13   Date for PT Re-Evaluation 01/08/16   Authorization Type UHC MCR  KX at visit 15   PT Start Time 0803   PT Stop Time 0847   PT Time Calculation (min) 44 min   Activity Tolerance Patient tolerated treatment well   Behavior [facility] for [facility] assessed/performed      Past Medical History:  Diagnosis Date  . Anxiety   . Hypertension     Past Surgical History:  Procedure Laterality Date  . CHOLECYSTECTOMY      Vitals:   12/22/15 0852  BP: 124/78        Subjective Assessment - 12/22/15 0805    Subjective Pt reports her hip is feeling a litttle better but both knees hurt today, likely due to weather. Pt reports a "pulling" sensation that is hurting her hip and knee, running her hand along her IT band as she describes the pain. Reports "yesterday it was fine, must be the weather."    Patient Stated Goals lay on R hip, return to walking program (45 min daily), yard work, helping elderly neighbors   Currently in Pain? Yes   Pain Score 6    Pain Location Hip   Pain Orientation Right   Pain Descriptors / Indicators --  pulling   Pain Score 6   Pain Location Knee   Pain Orientation Right;Left   Pain Descriptors / Indicators --  pulling            [facility] PT Assessment - 12/22/15 0001      Observation/Other Assessments   Lower Extremity Functional Scale  30/80                     [facility] Adult PT Treatment/Exercise - 12/22/15 0001      Knee/Hip Exercises: Stretches   Active Hamstring Stretch 5 reps;Both   Piriformis Stretch 30 seconds;Both   Gastroc  Stretch 2 reps;30 seconds   Gastroc Stretch Limitations slant board     Knee/Hip Exercises: Aerobic   Nustep L6 8 min     Knee/Hip Exercises: Standing   Heel Raises Limitations foot on step, heel raises on lower leg   Gait Training heel toe with upright posture     Knee/Hip Exercises: Seated   Other Seated Knee/Hip Exercises inversion ball squeeze paired with DF     Knee/Hip Exercises: Supine   Bridges Limitations x20   Straight Leg Raises 20 reps;Both     Knee/Hip Exercises: Prone   Hip Extension 10 reps;Both   Hip Extension Limitations with knee flexed to 90                PT Education - 12/22/15 0809    Education provided Yes   Education Details exercise form/rationale, ITband anatomy   Person(s) Educated Patient   Methods Explanation;Demonstration;Tactile cues;Verbal cues   Comprehension Verbalized understanding;Returned demonstration;Verbal cues required;Tactile cues required;Need further instruction          PT Short Term Goals - 12/18/15 1021      PT SHORT TERM GOAL #1   Title pt will  be independent with HEP as it has been established by 8/4   Status Achieved     PT SHORT TERM GOAL #2   Title Pt will be able to demonstrate appropriate activation of glut med by performing 10 sidelying hip abduction without compensation from quadriceps   Status Achieved           PT Long Term Goals - 12/18/15 1030      PT LONG TERM GOAL #1   Title Pt will be able to ambulate for 45 min to return to exercise and safely navigate around the community   Baseline 20 min (give or take 15-20 depending on the day) on 8/4   Status On-going     PT LONG TERM GOAL #2   Title Pt will demonstrate 5lb grip strength increase bilaterally to indicate improved functional grip for lifting and other household chores   Baseline R 50lb L 45lb   Status On-going     PT LONG TERM GOAL #3   Title Pt will verbalize decreased pain and improved strength enough to safely return to aiding  elderly neighbors   Baseline improving a little bit but still does not feel strong enough for everything   Status On-going     PT LONG TERM GOAL #4   Title Pt will demontrate gait pattern with hip extension and appropriate heel toe pattern bilaterally   Baseline improved ext, just past neutral bilat, cont to ambulate with flexed posture, decreased antalgic gait/hip drop   Status On-going               Plan - 01/10/16 0810    Clinical Impression Statement Notable tightness in IT band which created concordant pain upon palpation. Pt was educated on importance of stretching IT band, especially with increased "pulling" as she is feeling today. Pt is prone to IT band tightness due to genu valgus in RLE. Demo improved stance width which has improved balance and control but cont to demo excessive bilateral foot ER that she is now able to correct with minimal VC.    PT Next Visit Plan [facility] hip strengthening, gait training, abductor/external rotator activation in functional positions.    Consulted and Agree with Plan of Care Patient      Patient will benefit from skilled therapeutic intervention in order to improve the following deficits and impairments:     Visit Diagnosis: Pain in right hip  Pain in joints of right hand  Muscle weakness (generalized)  Pain in joints of left hand  Difficulty in walking, not elsewhere classified       G-Codes - 01-10-2016 0849    Functional Assessment Tool Used Clinical judgement, LEFS 30/80 (63% impairment)   Functional Limitation Mobility: Walking and moving around   Mobility: Walking and Moving Around Current Status (W0981) At least 60 percent but less than 80 percent impaired, limited or restricted   Mobility: Walking and Moving Around Goal Status [phone]) At least 40 percent but less than 60 percent impaired, limited or restricted      Problem List Patient Active Problem List   Diagnosis Date Noted  . Supraventricular tachycardia (HCC)  06/10/2012  . Hypokalemia 06/09/2012  . Partial small bowel obstruction (HCC) 06/07/2012  . Sepsis (HCC) 06/03/2012  . Lactic acidosis 06/02/2012  . Abdominal pain 06/01/2012  . Nausea & vomiting 06/01/2012  . Body aches 06/01/2012  . Flu-like symptoms 06/01/2012  . Ileus (HCC) 06/01/2012  . HTN (hypertension) 06/01/2012  . Adrenal nodule (HCC) 06/01/2012  Telesa Jeancharles C. Keyetta Hollingworth PT, DPT 12/22/15 8:52 AM   [facility] [address] [place], [place], [address] Phone: [phone]   Fax:  [phone]  Name: Maricela Schreur MRN: [id number] Date of Birth: 12-Oct-1945

## 2015-12-24 ENCOUNTER — Ambulatory Visit: Payer: Medicare Other | Admitting: Physical Therapy

## 2015-12-24 VITALS — BP 124/86

## 2015-12-24 DIAGNOSIS — M25551 Pain in right hip: Secondary | ICD-10-CM | POA: Diagnosis not present

## 2015-12-24 DIAGNOSIS — R262 Difficulty in walking, not elsewhere classified: Secondary | ICD-10-CM

## 2015-12-24 DIAGNOSIS — M25542 Pain in joints of left hand: Secondary | ICD-10-CM

## 2015-12-24 DIAGNOSIS — M25541 Pain in joints of right hand: Secondary | ICD-10-CM

## 2015-12-24 DIAGNOSIS — M6281 Muscle weakness (generalized): Secondary | ICD-10-CM

## 2015-12-24 NOTE — Therapy (Signed)
[facility] [address] [place], [place], [address] Phone: [phone]   Fax:  [phone]  Physical Therapy Treatment  Patient Details  Name: Kimberly Sellers MRN: [id number] Date of Birth: 12/10/1945 Referring Provider: Willey BladeEric Dean, MD  Encounter Date: 12/24/2015      PT End of Session - 12/24/15 0850    Visit Number 9   Number of Visits 13   Date for PT Re-Evaluation 01/08/16   Authorization Type UHC MCR  KX at visit 15   PT Start Time 0845   PT Stop Time 0926   PT Time Calculation (min) 41 min   Activity Tolerance Patient tolerated treatment well   Behavior During Therapy [facility] for tasks assessed/performed      Past Medical History:  Diagnosis Date  . Anxiety   . Hypertension     Past Surgical History:  Procedure Laterality Date  . CHOLECYSTECTOMY      Vitals:   12/24/15 0926  BP: 124/86        Subjective Assessment - 12/24/15 0848    Subjective Pt reports hip is feeling good but her R knee is hurting today. Felt good after last visit but noticed increase in pain when walking home from church yesterday. "pulling" sensation from last visit in hip is mosly resolved, "it's back every now and then."   Patient Stated Goals lay on R hip, return to walking program (45 min daily), yard work, helping elderly neighbors   Currently in Pain? Yes   Pain Score 8    Pain Location Knee   Pain Orientation Right                         [facility] Adult PT Treatment/Exercise - 12/24/15 0001      Knee/Hip Exercises: Aerobic   Nustep L3 10 min     Knee/Hip Exercises: Supine   Bridges Limitations bilat LE over physioball 2' 5s holds   Straight Leg Raises 20 reps;Both   Knee Flexion Limitations iso hamstring curl over phsyioball 2' 5s holds   Other Supine Knee/Hip Exercises supine clam with abdominal bracing x20 green tband     Knee/Hip Exercises: Sidelying   Clams x30 each red tband     Manual Therapy   Manual therapy comments roller bilat IT bands                PT Education - 12/24/15 0850    Education provided Yes   Education Details anatomy of condition in knee, exercise form/rationale    Person(s) Educated Patient   Methods Explanation;Demonstration;Tactile cues;Verbal cues   Comprehension Verbalized understanding;Returned demonstration;Verbal cues required;Tactile cues required;Need further instruction          PT Short Term Goals - 12/18/15 1021      PT SHORT TERM GOAL #1   Title pt will be independent with HEP as it has been established by 8/4   Status Achieved     PT SHORT TERM GOAL #2   Title Pt will be able to demonstrate appropriate activation of glut med by performing 10 sidelying hip abduction without compensation from quadriceps   Status Achieved           PT Long Term Goals - 12/18/15 1030      PT LONG TERM GOAL #1   Title Pt will be able to ambulate for 45 min to return to exercise and safely navigate around the community   Baseline 20 min (give or  take 15-20 depending on the day) on 8/4   Status On-going     PT LONG TERM GOAL #2   Title Pt will demonstrate 5lb grip strength increase bilaterally to indicate improved functional grip for lifting and other household chores   Baseline R 50lb L 45lb   Status On-going     PT LONG TERM GOAL #3   Title Pt will verbalize decreased pain and improved strength enough to safely return to aiding elderly neighbors   Baseline improving a little bit but still does not feel strong enough for everything   Status On-going     PT LONG TERM GOAL #4   Title Pt will demontrate gait pattern with hip extension and appropriate heel toe pattern bilaterally   Baseline improved ext, just past neutral bilat, cont to ambulate with flexed posture, decreased antalgic gait/hip drop   Status On-going               Plan - 12/24/15 0900    Clinical Impression Statement Focus on strengthening exercises today to avoid  increase in knee pain. Pt tol exercises well and verbalized decrease in knee pain folllowing treatment. Will cont to benefit from strength training in lower extremity biomechanical chain to decrease inappropriate pressures through hip and knee joints.    PT Next Visit Plan [facility] hip strengthening, gait training, abductor/external rotator activation in functional positions.    Consulted and Agree with Plan of Care Patient      Patient will benefit from skilled therapeutic intervention in order to improve the following deficits and impairments:     Visit Diagnosis: Pain in right hip  Pain in joints of right hand  Muscle weakness (generalized)  Pain in joints of left hand  Difficulty in walking, not elsewhere classified     Problem List Patient Active Problem List   Diagnosis Date Noted  . Supraventricular tachycardia (HCC) 06/10/2012  . Hypokalemia 06/09/2012  . Partial small bowel obstruction (HCC) 06/07/2012  . Sepsis (HCC) 06/03/2012  . Lactic acidosis 06/02/2012  . Abdominal pain 06/01/2012  . Nausea & vomiting 06/01/2012  . Body aches 06/01/2012  . Flu-like symptoms 06/01/2012  . Ileus (HCC) 06/01/2012  . HTN (hypertension) 06/01/2012  . Adrenal nodule (HCC) 06/01/2012    Mannix Kroeker C. Emmelyn Schmale PT, DPT 12/24/15 10:20 AM   [facility] [address] [place], [place], [address] Phone: [phone]   Fax:  [phone]  Name: Kimberly Sellers MRN: [id number] Date of Birth: [birth year]

## 2015-12-29 ENCOUNTER — Ambulatory Visit: Payer: Medicare Other | Admitting: Physical Therapy

## 2015-12-29 VITALS — BP 114/78

## 2015-12-29 DIAGNOSIS — R262 Difficulty in walking, not elsewhere classified: Secondary | ICD-10-CM

## 2015-12-29 DIAGNOSIS — M25551 Pain in right hip: Secondary | ICD-10-CM

## 2015-12-29 DIAGNOSIS — M25542 Pain in joints of left hand: Secondary | ICD-10-CM

## 2015-12-29 DIAGNOSIS — M6281 Muscle weakness (generalized): Secondary | ICD-10-CM

## 2015-12-29 DIAGNOSIS — M25541 Pain in joints of right hand: Secondary | ICD-10-CM

## 2015-12-29 NOTE — Therapy (Signed)
[facility] [address] [place], [place], [address] Phone: [phone]   Fax:  [phone]  Physical Therapy Treatment  Patient Details  Name: Kimberly Sellers MRN: [id number] Date of Birth: 03/16/1946 Referring Provider: Willey BladeEric Dean, MD  Encounter Date: 12/29/2015      PT End of Session - 12/29/15 1317    Visit Number 10   Number of Visits 13   Date for PT Re-Evaluation 01/08/16   PT Start Time 1150   PT Stop Time 1236   PT Time Calculation (min) 46 min   Activity Tolerance Patient tolerated treatment well   Behavior During Therapy [facility] for tasks assessed/performed      Past Medical History:  Diagnosis Date  . Anxiety   . Hypertension     Past Surgical History:  Procedure Laterality Date  . CHOLECYSTECTOMY      Vitals:   12/29/15 1153  BP: 114/78        Subjective Assessment - 12/29/15 1153    Subjective Pain improving Right knee.   Currently in Pain? Yes   Pain Score --  mild to moderate   Pain Location Knee   Pain Orientation Right   Pain Descriptors / Indicators Dull;Aching;Sharp  pops   Aggravating Factors  walking,  gets stiff laying in bed   Pain Relieving Factors rest.  Exercises (even though she is sore a day or two post session)  rubbing alcohol,  prop with pillows   Pain Score 5   Pain Location Hip   Pain Orientation Right   Pain Descriptors / Indicators Sore   Aggravating Factors  walking the way I do,  sleeping on right hip   Pain Relieving Factors rest                         [facility] Adult PT Treatment/Exercise - 12/29/15 0001      Elbow Exercises   Wrist Extension Limitations seated row, 10 pounds (New machine)  stopped due to increased shoulder pain.  5 X  extra time needed to get on machine.     Lumbar Exercises: Stretches   Double Knee to Chest Stretch --  legs on ball 10 X, AA initially     Knee/Hip Exercises: Stretches   Gastroc Stretch 2 reps;20 seconds  cues  needed for avoiding moving into and out of stretch   Other Knee/Hip Stretches hip adductor stretch cues to hold longer 3 reps +     Knee/Hip Exercises: Aerobic   Nustep L5 12 minutes  patient's request.     Knee/Hip Exercises: Standing   Forward Step Up Both;1 set;10 reps;Hand Hold: 2;Step Height: 6"   Forward Step Up Limitations cues initially     Knee/Hip Exercises: Seated   Sit to Sand 10 reps     Knee/Hip Exercises: Supine   Bridges Limitations 10 X   Knee Flexion Limitations iso hamstring curl over phsyioball 2' 5s holds   Other Supine Knee/Hip Exercises isometric hip flexion (RED) 10 X 5 seconds,  cues for duration     Knee/Hip Exercises: Sidelying   Clams 10 X red band left,  30 X right, red band  cues initially     Manual Therapy   Manual Therapy --  tissue softened,     Manual therapy comments roller bilat IT bands  RT proximal /posterior hip continue to be stiff.  PT Short Term Goals - 12/18/15 1021      PT SHORT TERM GOAL #1   Title pt will be independent with HEP as it has been established by 8/4   Status Achieved     PT SHORT TERM GOAL #2   Title Pt will be able to demonstrate appropriate activation of glut med by performing 10 sidelying hip abduction without compensation from quadriceps   Status Achieved           PT Long Term Goals - 12/18/15 1030      PT LONG TERM GOAL #1   Title Pt will be able to ambulate for 45 min to return to exercise and safely navigate around the community   Baseline 20 min (give or take 15-20 depending on the day) on 8/4   Status On-going     PT LONG TERM GOAL #2   Title Pt will demonstrate 5lb grip strength increase bilaterally to indicate improved functional grip for lifting and other household chores   Baseline R 50lb L 45lb   Status On-going     PT LONG TERM GOAL #3   Title Pt will verbalize decreased pain and improved strength enough to safely return to aiding elderly neighbors    Baseline improving a little bit but still does not feel strong enough for everything   Status On-going     PT LONG TERM GOAL #4   Title Pt will demontrate gait pattern with hip extension and appropriate heel toe pattern bilaterally   Baseline improved ext, just past neutral bilat, cont to ambulate with flexed posture, decreased antalgic gait/hip drop   Status On-going               Plan - 12/29/15 1318    Clinical Impression Statement Focus continues to be as previous without increasing her knee pain.  Patrient tends to do anything you ask her to do even if painful, so she has to be monitored closely.   Mild knee pain increase with session.  She declined the need of modalities.    PT Next Visit Plan [facility] hip strengthening, gait training, abductor/external rotator activation in functional positions.    PT Home Exercise Plan continue.  She may try a few sit to stands from couch arm each time she passes by.   Consulted and Agree with Plan of Care Patient      Patient will benefit from skilled therapeutic intervention in order to improve the following deficits and impairments:  Abnormal gait, Improper body mechanics, Pain, Postural dysfunction, Decreased strength, Difficulty walking  Visit Diagnosis: Pain in right hip  Pain in joints of right hand  Muscle weakness (generalized)  Pain in joints of left hand  Difficulty in walking, not elsewhere classified     Problem List Patient Active Problem List   Diagnosis Date Noted  . Supraventricular tachycardia (HCC) 06/10/2012  . Hypokalemia 06/09/2012  . Partial small bowel obstruction (HCC) 06/07/2012  . Sepsis (HCC) 06/03/2012  . Lactic acidosis 06/02/2012  . Abdominal pain 06/01/2012  . Nausea & vomiting 06/01/2012  . Body aches 06/01/2012  . Flu-like symptoms 06/01/2012  . Ileus (HCC) 06/01/2012  . HTN (hypertension) 06/01/2012  . Adrenal nodule (HCC) 06/01/2012    [name] 12/29/2015, 1:34 PM  [facility] [address] [place], [place], [address] Phone: [phone]   Fax:  [phone]  Name: Kimberly Sellers MRN: [id number] Date of Birth: 07/03/1945   Liz BeachKaren Prajwal Fellner, PTA 12/29/15 1:34 PM Phone:  [phone] Fax: [phone]  

## 2015-12-31 ENCOUNTER — Ambulatory Visit: Payer: Medicare Other | Admitting: Physical Therapy

## 2015-12-31 DIAGNOSIS — M25542 Pain in joints of left hand: Secondary | ICD-10-CM

## 2015-12-31 DIAGNOSIS — M6281 Muscle weakness (generalized): Secondary | ICD-10-CM

## 2015-12-31 DIAGNOSIS — M25551 Pain in right hip: Secondary | ICD-10-CM | POA: Diagnosis not present

## 2015-12-31 DIAGNOSIS — M25541 Pain in joints of right hand: Secondary | ICD-10-CM

## 2015-12-31 DIAGNOSIS — R262 Difficulty in walking, not elsewhere classified: Secondary | ICD-10-CM

## 2015-12-31 NOTE — Therapy (Signed)
[place], [place], [address] Phone: [phone]   Fax:  [phone]  Physical Therapy Treatment  Patient Details  Name: Kimberly Sellers MRN: [id number] Date of Birth: Nov 25, 1945 Referring Provider: Kevan Ny, MD  Encounter Date: 12/31/2015      PT End of Session - 12/31/15 1850    Visit Number 11   Number of Visits 13   Date for PT Re-Evaluation 01/08/16   PT Start Time 0803   PT Stop Time 0850   PT Time Calculation (min) 47 min   Behavior During Therapy [facility] for tasks assessed/performed      Past Medical History:  Diagnosis Date  . Anxiety   . Hypertension     Past Surgical History:  Procedure Laterality Date  . CHOLECYSTECTOMY      There were no vitals filed for this visit.      Subjective Assessment - 12/31/15 0809    Subjective 5/10 knee right,  left knee very mild.     Currently in Pain? Yes   Pain Score 5    Pain Location Knee   Pain Orientation Right;Left  see above   Aggravating Factors  walking.  too much time in bed/ sitting   Pain Relieving Factors rest, rexercise   Pain Score --  did not report   Pain Location Hip   Pain Orientation Right   Pain Descriptors / Indicators Sore   Aggravating Factors  walking   Pain Relieving Factors rest                         [facility] Adult PT Treatment/Exercise - 12/31/15 0001      Knee/Hip Exercises: Stretches   Passive Hamstring Stretch 3 reps;30 seconds   Gastroc Stretch 2 reps;30 seconds     Knee/Hip Exercises: Aerobic   Nustep l5 10 min     Knee/Hip Exercises: Standing   Knee Flexion 5 reps   Hip Flexion 5 reps   Hip ADduction 1 set;10 reps   Hip Extension 1 set;10 reps     Knee/Hip Exercises: Seated   Long Arc Quad 5 reps   Hamstring Curl 1 set;10 reps   Sit to [employer] 10 reps                  PT Short Term Goals - 12/18/15 1021      PT SHORT TERM GOAL #1   Title pt will be independent with  HEP as it has been established by 8/4   Status Achieved     PT SHORT TERM GOAL #2   Title Pt will be able to demonstrate appropriate activation of glut med by performing 10 sidelying hip abduction without compensation from quadriceps   Status Achieved           PT Long Term Goals - 12/31/15 1855      PT LONG TERM GOAL #1   Title Pt will be able to ambulate for 45 min to return to exercise and safely navigate around the community   Time 6   Period Weeks   Status Unable to assess     PT LONG TERM GOAL #2   Title Pt will demonstrate 5lb grip strength increase bilaterally to indicate improved functional grip for lifting and other household chores   Time 6   Period Weeks   Status Unable to assess     PT LONG TERM GOAL #3   Title Pt will  verbalize decreased pain and improved strength enough to safely return to aiding elderly neighbors   Baseline not back to helping,     Time 6   Period Weeks   Status On-going     PT LONG TERM GOAL #4   Title Pt will demontrate gait pattern with hip extension and appropriate heel toe pattern bilaterally   Baseline improving,  not yet as above,  less trunk flexion   Time 6   Period Weeks   Status On-going               Plan - 12/31/15 1854    Clinical Impression Statement Exercise continued.  Gait nored to have less deviations.  No new objective measures or goals met since last visit.   PT Next Visit Plan [facility] hip strengthening, gait training, abductor/external rotator activation in functional positions.    PT Home Exercise Plan continue   Consulted and Agree with Plan of Care Patient      Patient will benefit from skilled therapeutic intervention in order to improve the following deficits and impairments:  Abnormal gait, Improper body mechanics, Pain, Postural dysfunction, Decreased strength, Difficulty walking  Visit Diagnosis: Pain in right hip  Pain in joints of right hand  Muscle weakness (generalized)  Pain in joints of  left hand  Difficulty in walking, not elsewhere classified     Problem List Patient Active Problem List   Diagnosis Date Noted  . Supraventricular tachycardia ([place]) 06/10/2012  . Hypokalemia 06/09/2012  . Partial small bowel obstruction ([place]) 06/07/2012  . Sepsis ([place]) 06/03/2012  . Lactic acidosis 06/02/2012  . Abdominal pain 06/01/2012  . Nausea & vomiting 06/01/2012  . Body aches 06/01/2012  . Flu-like symptoms 06/01/2012  . Ileus ([place]) 06/01/2012  . HTN (hypertension) 06/01/2012  . Adrenal nodule ([place]) 06/01/2012    Maelle Sheaffer 12/31/2015, 6:58 PM  [facility] [address] [place], [place], [address] Phone: [phone]   Fax:  [phone]  Name: Kimberly Sellers MRN: [id number] Date of Birth: January 25, 1946   Melvenia Needles, PTA 12/31/15 6:58 PM Phone: [phone] Fax: [phone]

## 2016-01-04 ENCOUNTER — Ambulatory Visit: Payer: Medicare Other | Admitting: Physical Therapy

## 2016-01-04 DIAGNOSIS — M25542 Pain in joints of left hand: Secondary | ICD-10-CM

## 2016-01-04 DIAGNOSIS — M6281 Muscle weakness (generalized): Secondary | ICD-10-CM

## 2016-01-04 DIAGNOSIS — M25551 Pain in right hip: Secondary | ICD-10-CM | POA: Diagnosis not present

## 2016-01-04 DIAGNOSIS — R262 Difficulty in walking, not elsewhere classified: Secondary | ICD-10-CM

## 2016-01-04 DIAGNOSIS — M25541 Pain in joints of right hand: Secondary | ICD-10-CM

## 2016-01-04 NOTE — Therapy (Signed)
[facility] [address] [place], [place], [address] Phone: [phone]   Fax:  [phone]  Physical Therapy Treatment  Patient Details  Name: Kimberly Sellers MRN: [id number] Date of Birth: 10/31/1945 Referring Provider: Willey BladeEric Dean, MD  Encounter Date: 01/04/2016      PT End of Session - 01/04/16 1112    Visit Number 12   Number of Visits 13   Date for PT Re-Evaluation 01/08/16   Authorization Type UHC MCR  KX at visit 15   PT Start Time 1104   PT Stop Time 1200   PT Time Calculation (min) 56 min      Past Medical History:  Diagnosis Date  . Anxiety   . Hypertension     Past Surgical History:  Procedure Laterality Date  . CHOLECYSTECTOMY      There were no vitals filed for this visit.      Subjective Assessment - 01/04/16 1111    Subjective My hip and knee are hurting. It really helped last treatment, the ultrasound and tape.    Currently in Pain? Yes   Pain Score 5    Pain Location Hip  and knee   Pain Orientation Right                         [facility] Adult PT Treatment/Exercise - 01/04/16 0001      Knee/Hip Exercises: Aerobic   Nustep L5 12 min     Modalities   Modalities Moist Heat;Ultrasound     Moist Heat Therapy   Number Minutes Moist Heat 15 Minutes   Moist Heat Location Hip     Ultrasound   Ultrasound Location right lateral hip, glute med   Ultrasound Parameters 1.5 w/cm2 100% 1 mhz x 1 minutes   Ultrasound Goals Pain     Manual Therapy   Manual therapy comments --  rock blade used to decrease tenderness at glut med                   PT Short Term Goals - 12/18/15 1021      PT SHORT TERM GOAL #1   Title pt will be independent with HEP as it has been established by 8/4   Status Achieved     PT SHORT TERM GOAL #2   Title Pt will be able to demonstrate appropriate activation of glut med by performing 10 sidelying hip abduction without compensation from  quadriceps   Status Achieved           PT Long Term Goals - 12/31/15 1855      PT LONG TERM GOAL #1   Title Pt will be able to ambulate for 45 min to return to exercise and safely navigate around the community   Time 6   Period Weeks   Status Unable to assess     PT LONG TERM GOAL #2   Title Pt will demonstrate 5lb grip strength increase bilaterally to indicate improved functional grip for lifting and other household chores   Time 6   Period Weeks   Status Unable to assess     PT LONG TERM GOAL #3   Title Pt will verbalize decreased pain and improved strength enough to safely return to aiding elderly neighbors   Baseline not back to helping,     Time 6   Period Weeks   Status On-going     PT LONG TERM GOAL #4   Title Pt  will demontrate gait pattern with hip extension and appropriate heel toe pattern bilaterally   Baseline improving,  not yet as above,  less trunk flexion   Time 6   Period Weeks   Status On-going               Plan - 01/04/16 1113    Clinical Impression Statement Pt reports pain decreased after modalities and tape last visit however returned yesterday and this morning is a 5/10. We repeated ultrasound and tape today and will assess response next visit. Trial of HMP to lateral hip today.    PT Next Visit Plan [facility] hip strengthening, gait training, abductor/external rotator activation in functional positions. modalites/tape as needed, asses HMP      Patient will benefit from skilled therapeutic intervention in order to improve the following deficits and impairments:  Abnormal gait, Improper body mechanics, Pain, Postural dysfunction, Decreased strength, Difficulty walking  Visit Diagnosis: Pain in right hip  Pain in joints of right hand  Muscle weakness (generalized)  Pain in joints of left hand  Difficulty in walking, not elsewhere classified     Problem List Patient Active Problem List   Diagnosis Date Noted  . Supraventricular  tachycardia ([facility]) 06/10/2012  . Hypokalemia 06/09/2012  . Partial small bowel obstruction ([facility]) 06/07/2012  . Sepsis ([facility]) 06/03/2012  . Lactic acidosis 06/02/2012  . Abdominal pain 06/01/2012  . Nausea & vomiting 06/01/2012  . Body aches 06/01/2012  . Flu-like symptoms 06/01/2012  . Ileus ([facility]) 06/01/2012  . HTN (hypertension) 06/01/2012  . Adrenal nodule ([facility]) 06/01/2012    Sherrie Mustacheonoho, [place] Sanluis McGee, PTA 01/04/2016, 2:06 PM  [facility] [address] [place], [place], [address] Phone: [phone]   Fax:  [phone]  Name: Kimberly OverallCarolyn Egger MRN: [id number] Date of Birth: 11/06/1945

## 2016-01-05 ENCOUNTER — Encounter: Payer: Medicare Other | Admitting: Physical Therapy

## 2016-01-07 ENCOUNTER — Encounter: Payer: Self-pay | Admitting: Physical Therapy

## 2016-01-07 ENCOUNTER — Ambulatory Visit: Payer: Medicare Other | Admitting: Physical Therapy

## 2016-01-07 VITALS — BP 120/76

## 2016-01-07 DIAGNOSIS — M6281 Muscle weakness (generalized): Secondary | ICD-10-CM

## 2016-01-07 DIAGNOSIS — M25551 Pain in right hip: Secondary | ICD-10-CM

## 2016-01-07 DIAGNOSIS — R262 Difficulty in walking, not elsewhere classified: Secondary | ICD-10-CM

## 2016-01-07 DIAGNOSIS — M25541 Pain in joints of right hand: Secondary | ICD-10-CM

## 2016-01-07 DIAGNOSIS — M25542 Pain in joints of left hand: Secondary | ICD-10-CM

## 2016-01-07 NOTE — Patient Instructions (Signed)
Access Code: [email or web address]  URL: [email or web address]  Date: 01/07/2016  Prepared by: Army FossaJessica Chenika Nevils   Exercises  Supine Piriformis Stretch with Foot on Ground - 3 reps - 1 sets - 30 hold - 1x daily - 7x weekly  Supine Figure 4 Piriformis Stretch - 3 reps - 1 sets - 30 hold - 1x daily - 7x weekly  Seated Hamstring Stretch - 3 reps - 1 sets - 30 hold - 1x daily - 7x weekly  Supine Straight Leg Raises - 10 reps - 3 sets - 3 hold - 1x daily - 5x weekly  Sidelying Hip Abduction - 10 reps - 3 sets - 3 hold - 1x daily - 5x weekly  Clamshell - 10 reps - 3 sets - 3 hold - 1x daily - 5x weekly  Supine Bridge with Resistance Band - 10 reps - 3 sets - 5 hold - 1x daily - 5x weekly  Standing Hip Abduction - 20 reps - 1 sets - 1x daily - 5x weekly  Standing Hip Extension - 20 reps - 1 sets - 1x daily - 5x weekly  Standing Hip Flexion - 20 reps - 1 sets - 1x daily - 5x weekly  Standing Knee Flexion AROM with Chair Support - 20 reps - 1 sets - 1x daily - 5x weekly

## 2016-01-07 NOTE — Therapy (Signed)
[place], [place], [address] Phone: [phone]   Fax:  [phone]  Physical Therapy Treatment/Discharge  Patient Details  Name: Kimberly Sellers MRN: [id number] Date of Birth: 14-Jul-1945 Referring Provider: Kevan Ny, MD  Encounter Date: 01/07/2016      PT End of Session - 01/07/16 1140    Visit Number 13   Number of Visits 13   Date for PT Re-Evaluation 01/08/16   Authorization Type UHC MCR  KX at visit 15   PT Start Time 1100   PT Stop Time 1139   PT Time Calculation (min) 39 min   Activity Tolerance Patient tolerated treatment well   Behavior During Therapy [facility] for tasks assessed/performed      Past Medical History:  Diagnosis Date  . Anxiety   . Hypertension     Past Surgical History:  Procedure Laterality Date  . CHOLECYSTECTOMY      Vitals:   01/07/16 1135  BP: 120/76        Subjective Assessment - 01/07/16 1105    Subjective Pt reports hip and knee have been bothering her the last couple of days, unsure of anything she may have done. Is doing exercises at home and they help "somewhat"   Patient Stated Goals lay on R hip, return to walking program (45 min daily), yard work, helping elderly neighbors   Currently in Pain? Yes   Pain Score 4    Pain Location Hip   Pain Orientation Right   Pain Descriptors / Indicators --  pulling   Pain Type Chronic pain   Pain Location Knee   Pain Orientation Right   Pain Descriptors / Indicators --  pulling            [facility] PT Assessment - 01/07/16 0001      Strength   Right Hip Flexion 4+/5   Right Hip Extension 4/5   Right Hip ABduction 3+/5   Left Hip Flexion 4+/5   Left Hip Extension 4-/5   Left Hip ABduction 4-/5                     [facility] Adult PT Treatment/Exercise - 01/07/16 0001      Lumbar Exercises: Stretches   Piriformis Stretch Limitations figure 4 push, pull 30s ea     Knee/Hip Exercises: Stretches    Other Knee/Hip Stretches figure 4 push/pull 2x30s each     Knee/Hip Exercises: Aerobic   Nustep 10 min L4     Knee/Hip Exercises: Supine   Bridges with Clamshell 20 reps   Straight Leg Raises 20 reps;Both                PT Education - 01/07/16 1107    Education provided Yes   Education Details exercise form/rationale, knee valgus resulting in hip/knee pain & importance of HEP   Person(s) Educated Patient   Methods Explanation;Demonstration;Tactile cues;Verbal cues   Comprehension Verbalized understanding;Returned demonstration;Verbal cues required;Tactile cues required          PT Short Term Goals - 12/18/15 1021      PT SHORT TERM GOAL #1   Title pt will be independent with HEP as it has been established by 8/4   Status Achieved     PT SHORT TERM GOAL #2   Title Pt will be able to demonstrate appropriate activation of glut med by performing 10 sidelying hip abduction without compensation from quadriceps   Status Achieved  PT Long Term Goals - Jan 11, 2016 1108      PT LONG TERM GOAL #1   Title Pt will be able to ambulate for 45 min to return to exercise and safely navigate around the community   Baseline 30 min and becomes achey   Status Not Met     PT LONG TERM GOAL #2   Title Pt will demonstrate 5lb grip strength increase bilaterally to indicate improved functional grip for lifting and other household chores   Baseline R 57 L 50   Status Achieved     PT LONG TERM GOAL #3   Title Pt will verbalize decreased pain and improved strength enough to safely return to aiding elderly neighbors   Baseline has not returned at this time, scheduled to return on Sunday   Status Unable to assess     PT LONG TERM GOAL #4   Title Pt will demontrate gait pattern with hip extension and appropriate heel toe pattern bilaterally   Baseline significant improvement in heel-toe and hip ext bilaterally, notable improvement in trunk posture as well   Status Achieved                Plan - 01/11/2016 1141    Clinical Impression Statement Session ended a little early today due ot pt dizziness. Pt reports taking her BP meds but not eating any food today, was encouraged to go home and eat, pt agreed stating her MD told her she needed to eat as well.  Pt will be d/c to independent HEP today and was instructed to contact us with any further questions, comments or concerns.    Consulted and Agree with Plan of Care Patient      Patient will benefit from skilled therapeutic intervention in order to improve the following deficits and impairments:     Visit Diagnosis: Pain in right hip  Pain in joints of right hand  Muscle weakness (generalized)  Pain in joints of left hand  Difficulty in walking, not elsewhere classified       G-Codes - 01/11/16 1238    Functional Assessment Tool Used Clinical judgement, LEFS not completed at d/c   Functional Limitation Mobility: Walking and moving around   Mobility: Walking and Moving Around Goal Status [phone]) At least 40 percent but less than 60 percent impaired, limited or restricted   Mobility: Walking and Moving Around Discharge Status [phone]) At least 40 percent but less than 60 percent impaired, limited or restricted      Problem List Patient Active Problem List   Diagnosis Date Noted  . Supraventricular tachycardia ([place]) 06/10/2012  . Hypokalemia 06/09/2012  . Partial small bowel obstruction ([place]) 06/07/2012  . Sepsis ([place]) 06/03/2012  . Lactic acidosis 06/02/2012  . Abdominal pain 06/01/2012  . Nausea & vomiting 06/01/2012  . Body aches 06/01/2012  . Flu-like symptoms 06/01/2012  . Ileus ([place]) 06/01/2012  . HTN (hypertension) 06/01/2012  . Adrenal nodule ([place]) 06/01/2012  PHYSICAL THERAPY DISCHARGE SUMMARY  Visits from Start of Care: 13  Current functional level related to goals / functional outcomes: See above   Remaining deficits: See above   Education / Equipment: Anatomy of  condtion, POC, HEP, exercise form/rationale  Plan: Patient agrees to discharge.  Patient goals were partially met. Patient is being discharged due to lack of progress.  ?????       Kimberly Sellers PT, DPT Jan 11, 2016 12:42 PM   [place], [place],  [address] Phone: [phone]   Fax:  [phone]  Name: Kimberly Sellers MRN: [id number] Date of Birth: [birth year]

## 2016-01-12 ENCOUNTER — Encounter: Payer: Medicare Other | Admitting: Physical Therapy

## 2016-01-14 ENCOUNTER — Encounter: Payer: Medicare Other | Admitting: Physical Therapy

## 2016-01-29 LAB — GLUCOSE, POCT (MANUAL RESULT ENTRY): POC GLUCOSE: 124 mg/dL — AB (ref 70–99)

## 2016-08-23 ENCOUNTER — Other Ambulatory Visit: Payer: Self-pay | Admitting: Internal Medicine

## 2016-08-23 DIAGNOSIS — Z1231 Encounter for screening mammogram for malignant neoplasm of breast: Secondary | ICD-10-CM

## 2016-10-04 ENCOUNTER — Ambulatory Visit
Admission: RE | Admit: 2016-10-04 | Discharge: 2016-10-04 | Disposition: A | Discharge status: Home / Self Care | Payer: Medicare Other | Source: Ambulatory Visit | Attending: Internal Medicine | Admitting: Internal Medicine

## 2016-10-04 DIAGNOSIS — Z1231 Encounter for screening mammogram for malignant neoplasm of breast: Secondary | ICD-10-CM

## 2017-08-28 ENCOUNTER — Other Ambulatory Visit: Payer: Self-pay | Admitting: Internal Medicine

## 2017-08-28 DIAGNOSIS — Z139 Encounter for screening, unspecified: Secondary | ICD-10-CM

## 2017-10-05 ENCOUNTER — Ambulatory Visit
Admission: RE | Admit: 2017-10-05 | Discharge: 2017-10-05 | Disposition: A | Discharge status: Home / Self Care | Payer: Medicare Other | Source: Ambulatory Visit | Attending: Internal Medicine | Admitting: Internal Medicine

## 2017-10-05 DIAGNOSIS — Z139 Encounter for screening, unspecified: Secondary | ICD-10-CM

## 2018-08-28 ENCOUNTER — Other Ambulatory Visit: Payer: Self-pay | Admitting: Family Medicine

## 2018-08-28 DIAGNOSIS — Z1231 Encounter for screening mammogram for malignant neoplasm of breast: Secondary | ICD-10-CM

## 2018-10-24 ENCOUNTER — Other Ambulatory Visit: Payer: Self-pay

## 2018-10-24 ENCOUNTER — Ambulatory Visit
Admission: RE | Admit: 2018-10-24 | Discharge: 2018-10-24 | Disposition: A | Discharge status: Home / Self Care | Payer: Medicare Other | Source: Ambulatory Visit | Attending: Family Medicine | Admitting: Family Medicine

## 2018-10-24 DIAGNOSIS — Z1231 Encounter for screening mammogram for malignant neoplasm of breast: Secondary | ICD-10-CM

## 2019-07-16 ENCOUNTER — Ambulatory Visit: Payer: Medicare Other | Admitting: Podiatry

## 2019-07-19 ENCOUNTER — Ambulatory Visit: Payer: Medicare Other | Admitting: Podiatry

## 2019-07-19 ENCOUNTER — Telehealth: Payer: Self-pay | Admitting: Podiatry

## 2019-07-19 ENCOUNTER — Other Ambulatory Visit: Payer: Self-pay

## 2019-07-19 ENCOUNTER — Encounter: Payer: Self-pay | Admitting: Podiatry

## 2019-07-19 VITALS — BP 127/71 | HR 80 | Temp 97.4°F

## 2019-07-19 DIAGNOSIS — M2142 Flat foot [pes planus] (acquired), left foot: Secondary | ICD-10-CM

## 2019-07-19 DIAGNOSIS — Q828 Other specified congenital malformations of skin: Secondary | ICD-10-CM | POA: Diagnosis not present

## 2019-07-19 DIAGNOSIS — M2141 Flat foot [pes planus] (acquired), right foot: Secondary | ICD-10-CM | POA: Diagnosis not present

## 2019-07-19 DIAGNOSIS — M79675 Pain in left toe(s): Secondary | ICD-10-CM | POA: Diagnosis not present

## 2019-07-19 DIAGNOSIS — B351 Tinea unguium: Secondary | ICD-10-CM

## 2019-07-19 DIAGNOSIS — L84 Corns and callosities: Secondary | ICD-10-CM | POA: Diagnosis not present

## 2019-07-19 DIAGNOSIS — M79674 Pain in right toe(s): Secondary | ICD-10-CM

## 2019-07-19 DIAGNOSIS — R7303 Prediabetes: Secondary | ICD-10-CM

## 2019-07-19 NOTE — Patient Instructions (Signed)
Diabetes Mellitus and Foot Care Foot care is an important part of your health, especially when you have diabetes. Diabetes may cause you to have problems because of poor blood flow (circulation) to your feet and legs, which can cause your skin to:  Become thinner and drier.  Break more easily.  Heal more slowly.  Peel and crack. You may also have nerve damage (neuropathy) in your legs and feet, causing decreased feeling in them. This means that you may not notice minor injuries to your feet that could lead to more serious problems. Noticing and addressing any potential problems early is the best way to prevent future foot problems. How to care for your feet Foot hygiene  Wash your feet daily with warm water and mild soap. Do not use hot water. Then, pat your feet and the areas between your toes until they are completely dry. Do not soak your feet as this can dry your skin.  Trim your toenails straight across. Do not dig under them or around the cuticle. File the edges of your nails with an emery board or nail file.  Apply a moisturizing lotion or petroleum jelly to the skin on your feet and to dry, brittle toenails. Use lotion that does not contain alcohol and is unscented. Do not apply lotion between your toes. Shoes and socks  Wear clean socks or stockings every day. Make sure they are not too tight. Do not wear knee-high stockings since they may decrease blood flow to your legs.  Wear shoes that fit properly and have enough cushioning. Always look in your shoes before you put them on to be sure there are no objects inside.  To break in new shoes, wear them for just a few hours a day. This prevents injuries on your feet. Wounds, scrapes, corns, and calluses  Check your feet daily for blisters, cuts, bruises, sores, and redness. If you cannot see the bottom of your feet, use a mirror or ask someone for help.  Do not cut corns or calluses or try to remove them with medicine.  If you  find a minor scrape, cut, or break in the skin on your feet, keep it and the skin around it clean and dry. You may clean these areas with mild soap and water. Do not clean the area with peroxide, alcohol, or iodine.  If you have a wound, scrape, corn, or callus on your foot, look at it several times a day to make sure it is healing and not infected. Check for: ? Redness, swelling, or pain. ? Fluid or blood. ? Warmth. ? Pus or a bad smell. General instructions  Do not cross your legs. This may decrease blood flow to your feet.  Do not use heating pads or hot water bottles on your feet. They may burn your skin. If you have lost feeling in your feet or legs, you may not know this is happening until it is too late.  Protect your feet from hot and cold by wearing shoes, such as at the beach or on hot pavement.  Schedule a complete foot exam at least once a year (annually) or more often if you have foot problems. If you have foot problems, report any cuts, sores, or bruises to your health care provider immediately. Contact a health care provider if:  You have a medical condition that increases your risk of infection and you have any cuts, sores, or bruises on your feet.  You have an injury that is not   healing.  You have redness on your legs or feet.  You feel burning or tingling in your legs or feet.  You have pain or cramps in your legs and feet.  Your legs or feet are numb.  Your feet always feel cold.  You have pain around a toenail. Get help right away if:  You have a wound, scrape, corn, or callus on your foot and: ? You have pain, swelling, or redness that gets worse. ? You have fluid or blood coming from the wound, scrape, corn, or callus. ? Your wound, scrape, corn, or callus feels warm to the touch. ? You have pus or a bad smell coming from the wound, scrape, corn, or callus. ? You have a fever. ? You have a red line going up your leg. Summary  Check your feet every day  for cuts, sores, red spots, swelling, and blisters.  Moisturize feet and legs daily.  Wear shoes that fit properly and have enough cushioning.  If you have foot problems, report any cuts, sores, or bruises to your health care provider immediately.  Schedule a complete foot exam at least once a year (annually) or more often if you have foot problems. This information is not intended to replace advice given to you by your health care provider. Make sure you discuss any questions you have with your health care provider. Document Revised: 01/23/2019 Document Reviewed: 06/03/2016 Elsevier Patient Education  2020 [employer]  

## 2019-07-19 NOTE — Telephone Encounter (Signed)
Pt called and wanted Dr.Galaway to know she had been using keras=al for her toe fungus at home

## 2019-07-24 ENCOUNTER — Encounter: Payer: Self-pay | Admitting: Podiatry

## 2019-07-24 NOTE — Progress Notes (Signed)
Subjective: Kimberly Sellers presents today referred by Gwenyth Bender, MD for painful, elongated toenails and plantar lesion of left foot. Condition present for several weeks. She states she has been using Kerasal on her toenails.  Patient relates diagnosis of prediabetes. Takes Metformin 500 mg twice daily.  Patient denies any history of foot wounds.  Patient denies any history of numbness, tingling, burning, pins/needles sensations.  She is a former smoker and quit 50 years ago.   Past Medical History:  Diagnosis Date  . Anxiety   . Hypertension     Patient Active Problem List   Diagnosis Date Noted  . Supraventricular tachycardia (HCC) 06/10/2012  . Hypokalemia 06/09/2012  . Partial small bowel obstruction (HCC) 06/07/2012  . Sepsis (HCC) 06/03/2012  . Lactic acidosis 06/02/2012  . Abdominal pain 06/01/2012  . Nausea & vomiting 06/01/2012  . Body aches 06/01/2012  . Flu-like symptoms 06/01/2012  . Ileus (HCC) 06/01/2012  . HTN (hypertension) 06/01/2012  . Adrenal nodule (HCC) 06/01/2012    Past Surgical History:  Procedure Laterality Date  . CHOLECYSTECTOMY      Current Outpatient Medications on File Prior to Visit  Medication Sig Dispense Refill  . amLODipine (NORVASC) 10 MG tablet Take 10 mg by mouth daily.    Marland Kitchen aspirin 81 MG tablet Take 81 mg by mouth daily.    . Cholecalciferol (VITAMIN D3) 2000 units capsule Take 2,000 Units by mouth daily.    . cycloSPORINE (RESTASIS) 0.05 % ophthalmic emulsion Place 1 drop into both eyes daily as needed. For dry eyes per patient    . lisinopril-hydrochlorothiazide (PRINZIDE,ZESTORETIC) 20-25 MG tablet Take 1 tablet by mouth daily.    Marland Kitchen loratadine (CLARITIN) 10 MG tablet Take 10 mg by mouth daily.    . pantoprazole (PROTONIX) 20 MG tablet Take 20 mg by mouth daily.    . potassium chloride SA (K-DUR,KLOR-CON) 20 MEQ tablet Take 1 tablet (20 mEq total) by mouth daily. 7 tablet 0  . QUEtiapine (SEROQUEL) 25 MG tablet Take 25 mg  by mouth at bedtime. Reported on 11/25/2015     No current facility-administered medications on file prior to visit.     No Known Allergies  Social History   Occupational History  . Not on file  Tobacco Use  . Smoking status: Never Smoker  Substance and Sexual Activity  . Alcohol use: No  . Drug use: No  . Sexual activity: Never    Birth control/protection: Post-menopausal    Family History  Problem Relation Age of Onset  . Breast cancer Cousin     There is no immunization history for the selected administration types on file for this patient.  Review of systems: Positive Findings in bold print.  Constitutional:  chills, fatigue, fever, sweats, weight change Communication: Nurse, learning disability, sign Presenter, broadcasting, hand writing, iPad/Android device Head: headaches, head injury Eyes: changes in vision, eye pain, glaucoma, cataracts, macular degeneration, diplopia, glare,  light sensitivity, eyeglasses or contacts, blindness Ears nose mouth throat: hearing impaired, hearing aids,  ringing in ears, deaf, sign language,  vertigo, nosebleeds,  rhinitis,  cold sores, snoring, swollen glands Cardiovascular: HTN, edema, arrhythmia, pacemaker in place, defibrillator in place, chest pain/tightness, chronic anticoagulation, blood clot, heart failure, MI Peripheral Vascular: leg cramps, varicose veins, blood clots, lymphedema, varicosities Respiratory:  difficulty breathing, denies congestion, SOB, wheezing, cough, emphysema Gastrointestinal: change in appetite or weight, abdominal pain, constipation, diarrhea, nausea, vomiting, vomiting blood, change in bowel habits, abdominal pain, jaundice, rectal bleeding, hemorrhoids, GERD Genitourinary:  nocturia,  pain on urination, polyuria,  blood in urine, Foley catheter, urinary urgency, ESRD on hemodialysis Musculoskeletal: amputation, cramping, stiff joints, painful joints, decreased joint motion, fractures, OA, gout, hemiplegia, paraplegia, uses  cane, wheelchair bound, uses walker, uses rollator Skin: +changes in toenails, color change, dryness, itching, mole changes,  rash, wound(s) Neurological: headaches, numbness in feet, paresthesias in feet, burning in feet, fainting,  seizures, change in speech,  headaches, memory problems/poor historian, cerebral palsy, weakness, paralysis, CVA, TIA Endocrine: diabetes, hypothyroidism, hyperthyroidism,  goiter, dry mouth, flushing, heat intolerance,  cold intolerance,  excessive thirst, denies polyuria,  nocturia Hematological:  easy bleeding, excessive bleeding, easy bruising, enlarged lymph nodes, on long term blood thinner, history of past transusions Allergy/immunological:  hives, eczema, frequent infections, multiple drug allergies, seasonal allergies, transplant recipient, multiple food allergies Psychiatric:  anxiety, depression, mood disorder, suicidal ideations, hallucinations, insomnia  Objective: Vitals:   07/19/19 0930  BP: 127/71  Pulse: 80  Temp: (!) 97.4 F (36.3 C)    74 y.o. female WD, WN IN NAD. AAO X 3.  Capillary refill time to digits immediate b/l. Palpable DP pulses b/l. Palpable PT pulses b/l. Pedal hair sparse b/l. Skin temperature gradient within normal limits b/l.  Pedal skin with normal turgor, texture and tone bilaterally. No open wounds bilaterally. No interdigital macerations bilaterally. Toenails 1-5 b/l elongated, dystrophic, thickened, crumbly with subungual debris and tenderness to dorsal palpation. Hyperkeratotic lesion(s) left heelpad.  No erythema, no edema, no drainage, no flocculence. Porokeratotic lesion(s) submet head 3. No erythema, no edema, no drainage, no flocculence.  Normal muscle strength 5/5 to all lower extremity muscle groups bilaterally, no pain crepitus or joint limitation noted with ROM b/l and pes planus deformity noted  Protective sensation intact 5/5 intact bilaterally with 10g monofilament b/l Vibratory sensation intact b/l  Proprioception intact bilaterally  1. Pain due to onychomycosis of toenails of both feet   2. Callus   3. Porokeratosis   4. Pes planus of both feet   5. Prediabetes     -Discussed diagnoses and treatment options. -Toenails 1-5 b/l were debrided in length and girth with sterile nail nippers and dremel without iatrogenic bleeding. She may continue using her Kerasal on her toenails and we will revisit progress on next visit. -Calluses left plantar heelpad were debrided without complication or incident. Total number debrided =1. -Painful porokeratotic lesions submet head 3 pared and enucleated with sterile scalpel blade without incident. -Patient to continue soft, supportive shoe gear daily. -Patient to report any pedal injuries to medical professional immediately. -Patient/POA to call should there be question/concern in the interim.

## 2019-09-16 ENCOUNTER — Other Ambulatory Visit: Payer: Self-pay | Admitting: Family Medicine

## 2019-09-16 DIAGNOSIS — Z1231 Encounter for screening mammogram for malignant neoplasm of breast: Secondary | ICD-10-CM

## 2019-10-21 ENCOUNTER — Ambulatory Visit (INDEPENDENT_AMBULATORY_CARE_PROVIDER_SITE_OTHER): Payer: Medicare Other | Admitting: Podiatry

## 2019-10-21 ENCOUNTER — Other Ambulatory Visit: Payer: Self-pay

## 2019-10-21 ENCOUNTER — Encounter: Payer: Self-pay | Admitting: Podiatry

## 2019-10-21 DIAGNOSIS — M79674 Pain in right toe(s): Secondary | ICD-10-CM

## 2019-10-21 DIAGNOSIS — B351 Tinea unguium: Secondary | ICD-10-CM | POA: Diagnosis not present

## 2019-10-21 DIAGNOSIS — L84 Corns and callosities: Secondary | ICD-10-CM

## 2019-10-21 DIAGNOSIS — R7303 Prediabetes: Secondary | ICD-10-CM

## 2019-10-21 DIAGNOSIS — M79675 Pain in left toe(s): Secondary | ICD-10-CM

## 2019-10-21 DIAGNOSIS — M2141 Flat foot [pes planus] (acquired), right foot: Secondary | ICD-10-CM

## 2019-10-21 DIAGNOSIS — M2142 Flat foot [pes planus] (acquired), left foot: Secondary | ICD-10-CM

## 2019-10-25 NOTE — Progress Notes (Signed)
Subjective: Kimberly Sellers is a pleasant 74 y.o. female patient seen today painful callus(es) b/l and painful mycotic toenails b/l that are difficult to trim. Pain interferes with ambulation. Aggravating factors include wearing enclosed shoe gear. Pain is relieved with periodic professional debridement.  Patient Active Problem List   Diagnosis Date Noted  . Supraventricular tachycardia ([place]) 06/10/2012  . Hypokalemia 06/09/2012  . Partial small bowel obstruction ([place]) 06/07/2012  . Sepsis ([place]) 06/03/2012  . Lactic acidosis 06/02/2012  . Abdominal pain 06/01/2012  . Nausea & vomiting 06/01/2012  . Body aches 06/01/2012  . Flu-like symptoms 06/01/2012  . Ileus ([place]) 06/01/2012  . HTN (hypertension) 06/01/2012  . Adrenal nodule ([place]) 06/01/2012    Current Outpatient Medications on File Prior to Visit  Medication Sig Dispense Refill  . amLODipine (NORVASC) 10 MG tablet Take 10 mg by mouth daily.    Marland Kitchen aspirin 81 MG tablet Take 81 mg by mouth daily.    . Cholecalciferol (VITAMIN D3) 2000 units capsule Take 2,000 Units by mouth daily.    . cycloSPORINE (RESTASIS) 0.05 % ophthalmic emulsion Place 1 drop into both eyes daily as needed. For dry eyes per patient    . diclofenac Sodium (VOLTAREN) 1 % GEL     . fluticasone (FLONASE) 50 MCG/ACT nasal spray SMARTSIG:1 Spray(s) Both Nares Daily PRN    . lisinopril-hydrochlorothiazide (PRINZIDE,ZESTORETIC) 20-25 MG tablet Take 1 tablet by mouth daily.    Marland Kitchen loratadine (CLARITIN) 10 MG tablet Take 10 mg by mouth daily.    . metFORMIN (GLUCOPHAGE) 1000 MG tablet Take 1,000 mg by mouth 2 (two) times daily.    . pantoprazole (PROTONIX) 20 MG tablet Take 20 mg by mouth daily.    . pantoprazole (PROTONIX) 40 MG tablet Take 40 mg by mouth daily.    . potassium chloride SA (K-DUR,KLOR-CON) 20 MEQ tablet Take 1 tablet (20 mEq total) by mouth daily. 7 tablet 0  . QUEtiapine (SEROQUEL) 25 MG tablet Take 25 mg by mouth at bedtime. Reported on 11/25/2015    .  rosuvastatin (CRESTOR) 10 MG tablet Take 10 mg by mouth at bedtime.     No current facility-administered medications on file prior to visit.    No Known Allergies  Objective: Physical Exam  General: Kimberly Sellers is a pleasant 74 y.o. African American female, in NAD. AAO x 3.   Vascular:  Neurovascular status unchanged b/l lower extremities. Capillary refill time to digits immediate b/l. Palpable DP pulses b/l. Palpable PT pulses b/l. Pedal hair sparse b/l. Skin temperature gradient within normal limits b/l. No edema noted b/l.  Dermatological:  Pedal skin with normal turgor, texture and tone bilaterally. No open wounds bilaterally. Toenails 1-5 b/l elongated, discolored, dystrophic, thickened, crumbly with subungual debris and tenderness to dorsal palpation. Hyperkeratotic lesion(s) L hallux and plantar aspect left heel.  No erythema, no edema, no drainage, no flocculence.  Musculoskeletal:  Normal muscle strength 5/5 to all lower extremity muscle groups bilaterally. No pain crepitus or joint limitation noted with ROM b/l. No gross bony deformities bilaterally. Pes planus deformity noted b/l.   Neurological:  Protective sensation intact 5/5 intact bilaterally with 10g monofilament b/l. Vibratory sensation intact b/l.  Assessment and Plan:  1. Pain due to onychomycosis of toenails of both feet   2. Callus   3. Pes planus of both feet   4. Prediabetes    -Examined patient. -Toenails 1-5 b/l were debrided in length and girth with sterile nail nippers and dremel without iatrogenic  bleeding.  -Callus(es) L hallux and plantar aspect left heel pared utilizing sterile scalpel blade without complication or incident. Total number debrided =2. -Patient to continue soft, supportive shoe gear daily. -Patient to report any pedal injuries to medical professional immediately. -Patient/POA to call should there be question/concern in the interim.  Return in about 3 months (around 01/21/2020)  for nail and callus trim.  Freddie Breech, DPM

## 2019-10-28 ENCOUNTER — Ambulatory Visit
Admission: RE | Admit: 2019-10-28 | Discharge: 2019-10-28 | Disposition: A | Discharge status: Home / Self Care | Payer: Medicare Other | Source: Ambulatory Visit | Attending: Family Medicine | Admitting: Family Medicine

## 2019-10-28 ENCOUNTER — Other Ambulatory Visit: Payer: Self-pay

## 2019-10-28 ENCOUNTER — Ambulatory Visit: Payer: Medicare Other

## 2019-10-28 DIAGNOSIS — Z1231 Encounter for screening mammogram for malignant neoplasm of breast: Secondary | ICD-10-CM

## 2020-01-07 ENCOUNTER — Other Ambulatory Visit: Payer: Self-pay | Admitting: General Practice

## 2020-01-07 DIAGNOSIS — Z780 Asymptomatic menopausal state: Secondary | ICD-10-CM

## 2020-01-28 ENCOUNTER — Ambulatory Visit: Payer: Medicare Other | Admitting: Podiatry

## 2020-02-03 ENCOUNTER — Encounter: Payer: Self-pay | Admitting: Podiatry

## 2020-02-03 ENCOUNTER — Ambulatory Visit (INDEPENDENT_AMBULATORY_CARE_PROVIDER_SITE_OTHER): Payer: Medicare Other | Admitting: Podiatry

## 2020-02-03 ENCOUNTER — Other Ambulatory Visit: Payer: Self-pay

## 2020-02-03 DIAGNOSIS — M79675 Pain in left toe(s): Secondary | ICD-10-CM | POA: Diagnosis not present

## 2020-02-03 DIAGNOSIS — R7303 Prediabetes: Secondary | ICD-10-CM

## 2020-02-03 DIAGNOSIS — L84 Corns and callosities: Secondary | ICD-10-CM

## 2020-02-03 DIAGNOSIS — M79674 Pain in right toe(s): Secondary | ICD-10-CM | POA: Diagnosis not present

## 2020-02-03 DIAGNOSIS — B351 Tinea unguium: Secondary | ICD-10-CM | POA: Diagnosis not present

## 2020-02-06 NOTE — Progress Notes (Signed)
Subjective: Kimberly Sellers is a pleasant 74 y.o. female patient seen today painful callus(es) left foot and painful thick toenails that are difficult to trim. Painful toenails interfere with ambulation. Aggravating factors include wearing enclosed shoe gear. Pain is relieved with periodic professional debridement. Painful calluses are aggravated when weightbearing with and without shoegear. Pain is relieved with periodic professional debridement.   She states she now sees her PCP at [facility]. Dr. Karl Ito and was seen last month. She has her next appointment next month.   She states her blood sugar was 96 mg/dl this morning and last U2G was 5.4%.  Patient Active Problem List   Diagnosis Date Noted   Supraventricular tachycardia (HCC) 06/10/2012   Hypokalemia 06/09/2012   Partial small bowel obstruction (HCC) 06/07/2012   Sepsis (HCC) 06/03/2012   Lactic acidosis 06/02/2012   Abdominal pain 06/01/2012   Nausea & vomiting 06/01/2012   Body aches 06/01/2012   Flu-like symptoms 06/01/2012   Ileus (HCC) 06/01/2012   HTN (hypertension) 06/01/2012   Adrenal nodule (HCC) 06/01/2012   No Known Allergies  Objective: Physical Exam  General: Kimberly Sellers is a pleasant 74 y.o. African American female, in NAD. AAO x 3.   Vascular:  Neurovascular status unchanged b/l lower extremities. Capillary refill time to digits immediate b/l. Palpable DP pulses b/l. Palpable PT pulses b/l. Pedal hair sparse b/l. Skin temperature gradient within normal limits b/l. No edema noted b/l.  Dermatological:  Pedal skin with normal turgor, texture and tone bilaterally. No open wounds bilaterally. Toenails 1-5 b/l elongated, discolored, dystrophic, thickened, crumbly with subungual debris and tenderness to dorsal palpation. Hyperkeratotic lesion(s) L hallux and plantar aspect left heel.  No erythema, no edema, no drainage, no flocculence.  Musculoskeletal:  Normal muscle strength 5/5  to all lower extremity muscle groups bilaterally. No pain crepitus or joint limitation noted with ROM b/l. No gross bony deformities bilaterally. Pes planus deformity noted b/l.   Neurological:  Protective sensation intact 5/5 intact bilaterally with 10g monofilament b/l. Vibratory sensation intact b/l.  Assessment and Plan:  1. Pain due to onychomycosis of toenails of both feet   2. Callus   3. Prediabetes    -Examined patient. -Toenails 1-5 b/l were debrided in length and girth with sterile nail nippers and dremel without iatrogenic bleeding.  -Callus(es) L hallux and plantar aspect of left heel  pared utilizing sterile scalpel blade without complication or incident. Total number debrided =2. -Patient to report any pedal injuries to medical professional immediately. -Patient to continue soft, supportive shoe gear daily. -Patient/POA to call should there be question/concern in the interim.  Return in about 3 months (around 05/04/2020).  Freddie Breech, DPM

## 2020-04-06 ENCOUNTER — Encounter (HOSPITAL_BASED_OUTPATIENT_CLINIC_OR_DEPARTMENT_OTHER): Payer: Self-pay

## 2020-04-06 ENCOUNTER — Emergency Department (HOSPITAL_BASED_OUTPATIENT_CLINIC_OR_DEPARTMENT_OTHER): Payer: Medicare Other

## 2020-04-06 ENCOUNTER — Emergency Department (HOSPITAL_BASED_OUTPATIENT_CLINIC_OR_DEPARTMENT_OTHER)
Admission: EM | Admit: 2020-04-06 | Discharge: 2020-04-06 | Disposition: A | Discharge status: Home / Self Care | Payer: Medicare Other | Attending: Emergency Medicine | Admitting: Emergency Medicine

## 2020-04-06 ENCOUNTER — Other Ambulatory Visit: Payer: Self-pay

## 2020-04-06 DIAGNOSIS — Z7984 Long term (current) use of oral hypoglycemic drugs: Secondary | ICD-10-CM | POA: Diagnosis not present

## 2020-04-06 DIAGNOSIS — S298XXA Other specified injuries of thorax, initial encounter: Secondary | ICD-10-CM

## 2020-04-06 DIAGNOSIS — Y9301 Activity, walking, marching and hiking: Secondary | ICD-10-CM | POA: Insufficient documentation

## 2020-04-06 DIAGNOSIS — S299XXA Unspecified injury of thorax, initial encounter: Secondary | ICD-10-CM | POA: Diagnosis not present

## 2020-04-06 DIAGNOSIS — Z79899 Other long term (current) drug therapy: Secondary | ICD-10-CM | POA: Insufficient documentation

## 2020-04-06 DIAGNOSIS — S7002XA Contusion of left hip, initial encounter: Secondary | ICD-10-CM | POA: Diagnosis not present

## 2020-04-06 DIAGNOSIS — W01198A Fall on same level from slipping, tripping and stumbling with subsequent striking against other object, initial encounter: Secondary | ICD-10-CM | POA: Insufficient documentation

## 2020-04-06 DIAGNOSIS — Z7982 Long term (current) use of aspirin: Secondary | ICD-10-CM | POA: Diagnosis not present

## 2020-04-06 DIAGNOSIS — W19XXXA Unspecified fall, initial encounter: Secondary | ICD-10-CM

## 2020-04-06 DIAGNOSIS — I10 Essential (primary) hypertension: Secondary | ICD-10-CM | POA: Diagnosis not present

## 2020-04-06 MED ORDER — METHOCARBAMOL 500 MG PO TABS: 500.0000 mg | ORAL_TABLET | Freq: Three times a day (TID) | ORAL | 0 refills | Status: DC | PRN

## 2020-04-06 NOTE — ED Notes (Signed)
Review D/C papers with pt, reviewed Rx with pt, pt states understanding, pt denies questions at this time. 

## 2020-04-06 NOTE — ED Provider Notes (Signed)
[facility] EMERGENCY DEPARTMENT Provider Note   CSN: [id number] Arrival date & time: 04/06/20  1016     History Chief Complaint  Patient presents with  . Fall    Kimberly Sellers is a 74 y.o. female.  HPI Patient presents with pain after fall.  States that 2 days ago she fell while she was at [employer].  Complaining of pain in her left shoulder ribs hip and lower extremity.  States no loss conscious.  No neck pain.  No numbness weakness.  States her left foot is now turned in.  States most pain is in her left hip.  No lightheadedness or dizziness.  States she is gotten up and walked around since she lives alone and has to.  States that she is not on blood thinners.  Pain is worsened over the last couple days.  Patient states she did not hit her head..   Past Medical History:  Diagnosis Date  . Anxiety   . Hypertension     Patient Active Problem List   Diagnosis Date Noted  . Supraventricular tachycardia (HCC) 06/10/2012  . Hypokalemia 06/09/2012  . Partial small bowel obstruction (HCC) 06/07/2012  . Sepsis (HCC) 06/03/2012  . Lactic acidosis 06/02/2012  . Abdominal pain 06/01/2012  . Nausea & vomiting 06/01/2012  . Body aches 06/01/2012  . Flu-like symptoms 06/01/2012  . Ileus (HCC) 06/01/2012  . HTN (hypertension) 06/01/2012  . Adrenal nodule (HCC) 06/01/2012    Past Surgical History:  Procedure Laterality Date  . CHOLECYSTECTOMY       OB History   No obstetric history on file.     Family History  Problem Relation Age of Onset  . Breast cancer Cousin     Social History   Tobacco Use  . Smoking status: Never Smoker  . Smokeless tobacco: Never Used  Substance Use Topics  . Alcohol use: No  . Drug use: No    Home Medications Prior to Admission medications   Medication Sig Start Date End Date Taking? Authorizing Provider  amLODipine (NORVASC) 10 MG tablet Take 10 mg by mouth daily.   Yes [provider]  aspirin 81 MG tablet Take  81 mg by mouth daily.   Yes [provider]  Cholecalciferol (VITAMIN D3) 2000 units capsule Take 2,000 Units by mouth daily.   Yes [provider]  cycloSPORINE (RESTASIS) 0.05 % ophthalmic emulsion Place 1 drop into both eyes daily as needed. For dry eyes per patient   Yes [provider]  lisinopril-hydrochlorothiazide (PRINZIDE,ZESTORETIC) 20-25 MG tablet Take 1 tablet by mouth daily.   Yes [provider]  loratadine (CLARITIN) 10 MG tablet Take 10 mg by mouth daily.   Yes [provider]  metFORMIN (GLUCOPHAGE) 500 MG tablet Take 500 mg by mouth 2 (two) times daily. 12/02/19  Yes [provider]  pantoprazole (PROTONIX) 20 MG tablet Take 20 mg by mouth daily.   Yes [provider]  rosuvastatin (CRESTOR) 10 MG tablet Take 10 mg by mouth at bedtime. 10/04/19  Yes [provider]  diclofenac Sodium (VOLTAREN) 1 % GEL  10/04/19   [provider]  fluticasone (FLONASE) 50 MCG/ACT nasal spray SMARTSIG:1 Spray(s) Both Nares Daily PRN 09/04/19   [provider]  metFORMIN (GLUCOPHAGE) 1000 MG tablet Take 1,000 mg by mouth 2 (two) times daily. 06/08/19   [provider]  methocarbamol (ROBAXIN) 500 MG tablet Take 1 tablet (500 mg total) by mouth every 8 (eight) hours as needed  for muscle spasms. 04/06/20   Benjiman Core, MD  pantoprazole (PROTONIX) 40 MG tablet Take 40 mg by mouth daily. 07/19/19   [provider]  potassium chloride SA (K-DUR,KLOR-CON) 20 MEQ tablet Take 1 tablet (20 mEq total) by mouth daily. 06/11/12   Standley Brooking, MD  QUEtiapine (SEROQUEL) 25 MG tablet Take 25 mg by mouth at bedtime. Reported on 11/25/2015    [provider]    Allergies    Patient has no known allergies.  Review of Systems   Review of Systems  Constitutional: Negative for appetite change, fatigue and fever.  HENT: Negative for dental problem.   Respiratory: Negative for shortness of  breath.   Cardiovascular: Positive for chest pain.  Gastrointestinal: Negative for abdominal pain.  Genitourinary: Negative for dysuria.  Musculoskeletal: Negative for back pain.       Left shoulder left hip left knee and left foot pain.  Skin: Negative for rash.  Neurological: Negative for weakness and numbness.  Psychiatric/Behavioral: Negative for confusion.    Physical Exam Updated Vital Signs BP 130/72 (BP Location: Right Arm)   Pulse 63   Temp 98.8 F (37.1 C) (Oral)   Resp 18   Ht 5\' 4"  (1.626 m)   Wt 83.5 kg   SpO2 100%   BMI 31.58 kg/m   Physical Exam Vitals and nursing note reviewed.  Constitutional:      Appearance: Normal appearance.  HENT:     Head: Atraumatic.     Right Ear: External ear normal.     Left Ear: External ear normal.     Mouth/Throat:     Mouth: Mucous membranes are moist.  Eyes:     Extraocular Movements: Extraocular movements intact.  Neck:     Comments: No midline tenderness. Cardiovascular:     Rate and Rhythm: Regular rhythm.  Pulmonary:     Comments: Tenderness to left anterior lower chest wall.  No crepitance or deformity. Chest:     Chest wall: Tenderness present.  Abdominal:     Tenderness: There is no abdominal tenderness.  Musculoskeletal:     Cervical back: Neck supple.     Comments: Tenderness over left shoulder.  No deformity.  No tenderness over elbow or hand.  Pulse intact in left wrist.  No midline cervical spine tenderness.  No thoracic or lumbar spine tenderness.  Does have tenderness over the left hip laterally.  Also some mild tenderness over knee and left foot.  Difficulty moving at hip.  Pelvis appears stable.  Skin:    Capillary Refill: Capillary refill takes less than 2 seconds.  Neurological:     Mental Status: She is alert and oriented to person, place, and time.  Psychiatric:        Mood and Affect: Mood normal.     ED Results / Procedures / Treatments   Labs (all labs ordered are listed, but only  abnormal results are displayed) Labs Reviewed - No data to display  EKG None  Radiology DG Ribs Unilateral W/Chest Left  Result Date: 04/06/2020 CLINICAL DATA:  04/08/2020 3 days ago at a retail store, LEFT posterior lower rib pain EXAM: LEFT RIBS AND CHEST - 3+ VIEW COMPARISON:  Chest radiograph 06/18/2015 FINDINGS: Normal heart size, mediastinal contours, and pulmonary vascularity. Central peribronchial thickening. Lungs otherwise clear. No pulmonary infiltrate, pleural effusion or pneumothorax. Bones demineralized. BB placed at site of symptoms lower posterolateral LEFT ribs. No rib fracture or bone destruction. IMPRESSION: Bronchitic changes. No acute osseous abnormalities.  Electronically Signed   By: Ulyses SouthwardMark  Boles M.D.   On: 04/06/2020 12:18   DG Shoulder Left  Result Date: 04/06/2020 CLINICAL DATA:  Larey SeatFell 3 days ago at a retail store, LEFT shoulder pain and limited range of motion EXAM: LEFT SHOULDER - 2+ VIEW COMPARISON:  None FINDINGS: Osseous demineralization. AC joint alignment normal. Inferior acromial spur formation, which may predispose the patient to rotator cuff pathology. No acute fracture, dislocation, or bone destruction. IMPRESSION: No acute osseous abnormalities. Inferior acromial spur formation, which may predispose the patient to rotator cuff pathology. Electronically Signed   By: Ulyses SouthwardMark  Boles M.D.   On: 04/06/2020 12:17   DG Knee Complete 4 Views Left  Result Date: 04/06/2020 CLINICAL DATA:  Larey SeatFell 3 days ago at retail store, LEFT knee pain and swelling, limited range of motion EXAM: LEFT KNEE - COMPLETE 4+ VIEW COMPARISON:  None FINDINGS: Osseous demineralization. Tricompartmental joint space narrowing and spur formation. Thin corticated os ossific density adjacent to medial femoral condyle consistent with Pellegrini-Stieda complex likely reflecting remote MCL injury. No acute fracture, dislocation, bone destruction, or joint effusion. 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 No acute abnormalities. Electronically Signed   By: Ulyses SouthwardMark  Boles M.D.   On: 04/06/2020 12:22   DG Foot Complete Left  Result Date: 04/06/2020 CLINICAL DATA:  Larey SeatFell in a retail store 3 days ago, diffuse LEFT foot pain EXAM: LEFT FOOT - COMPLETE 3+ VIEW COMPARISON:  None FINDINGS: Osseous demineralization. Mild hallux valgus. Joint spaces preserved. Deformity of PIP joint fourth toe, appears corticated and old. No acute fracture, dislocation, or bone destruction. Small plantar calcaneal spur. Question pes planus. Mild dorsal soft tissue swelling. IMPRESSION: Dorsal soft tissue swelling without acute osseous abnormalities. Small plantar calcaneal spur and question pes planus. Electronically Signed   By: Ulyses SouthwardMark  Boles M.D.   On: 04/06/2020 12:24   DG Hip Unilat W or Wo Pelvis 2-3 Views Left  Result Date: 04/06/2020 CLINICAL DATA:  Larey SeatFell 3 days ago a retail store, LEFT hip pain with limited range of motion, unable to bear full weight EXAM: DG HIP (WITH OR WITHOUT PELVIS) 2-3V LEFT COMPARISON:  Abdominal radiograph 06/02/2012 FINDINGS: Osseous demineralization. Bone islands at RIGHT iliac bone and LEFT femoral neck again seen. Hip and SI joint spaces preserved. No acute fracture, dislocation, or bone destruction. Facet degenerative changes lower lumbar spine. IMPRESSION: No acute osseous abnormalities. Electronically Signed   By: Ulyses SouthwardMark  Boles M.D.   On: 04/06/2020 12:20    Procedures Procedures (including critical care time)  Medications Ordered in ED Medications - No data to display  ED Course  I have reviewed the triage vital signs and the nursing notes.  Pertinent labs & imaging results that were available during my care of the patient were reviewed by me and considered in my medical decision making (see chart for details).    MDM Rules/Calculators/A&P                          Patient with left-sided pain after fall.  Imaging reassuring.  Has been  ambulatory and left hip.  Occult fracture felt less likely.  No abdominal tenderness.  Doubt splenic injury.  Will discharge home with some muscle relaxer.  Informed of risks of the muscle relaxer in terms of falls or potential mental status change.  Will follow with PCP in case hip pain does not improve. Final Clinical Impression(s) / ED Diagnoses Final diagnoses:  Fall, initial encounter  Blunt chest trauma, initial encounter  Contusion of left hip, initial encounter    Rx / DC Orders ED Discharge Orders         Ordered    methocarbamol (ROBAXIN) 500 MG tablet  Every 8 hours PRN        04/06/20 1313           Benjiman Core, MD 04/06/20 1317

## 2020-04-06 NOTE — ED Notes (Signed)
Patient transported to XRAY 

## 2020-04-06 NOTE — ED Notes (Signed)
Pt self ambulated to wheelchair to restroom.

## 2020-04-06 NOTE — ED Triage Notes (Addendum)
Pt states she was walking in [place] on Saturday and she tripped over rug and fell onto left side. Complaining of left side pain (foot,hip, flank, shoulder). Denies hitting head, not on thinners.

## 2020-04-17 ENCOUNTER — Ambulatory Visit
Admission: RE | Admit: 2020-04-17 | Discharge: 2020-04-17 | Disposition: A | Discharge status: Home / Self Care | Payer: Medicare Other | Source: Ambulatory Visit | Attending: General Practice | Admitting: General Practice

## 2020-04-17 ENCOUNTER — Other Ambulatory Visit: Payer: Self-pay

## 2020-04-17 DIAGNOSIS — Z780 Asymptomatic menopausal state: Secondary | ICD-10-CM

## 2020-05-05 ENCOUNTER — Ambulatory Visit (INDEPENDENT_AMBULATORY_CARE_PROVIDER_SITE_OTHER): Payer: Medicare Other | Admitting: Podiatry

## 2020-05-05 ENCOUNTER — Encounter: Payer: Self-pay | Admitting: Podiatry

## 2020-05-05 ENCOUNTER — Other Ambulatory Visit: Payer: Self-pay

## 2020-05-05 DIAGNOSIS — R7303 Prediabetes: Secondary | ICD-10-CM

## 2020-05-05 DIAGNOSIS — M79675 Pain in left toe(s): Secondary | ICD-10-CM | POA: Diagnosis not present

## 2020-05-05 DIAGNOSIS — B351 Tinea unguium: Secondary | ICD-10-CM

## 2020-05-05 DIAGNOSIS — M79674 Pain in right toe(s): Secondary | ICD-10-CM | POA: Diagnosis not present

## 2020-05-05 DIAGNOSIS — M2141 Flat foot [pes planus] (acquired), right foot: Secondary | ICD-10-CM

## 2020-05-05 DIAGNOSIS — M2142 Flat foot [pes planus] (acquired), left foot: Secondary | ICD-10-CM

## 2020-05-05 DIAGNOSIS — L84 Corns and callosities: Secondary | ICD-10-CM | POA: Diagnosis not present

## 2020-05-10 NOTE — Progress Notes (Signed)
Subjective: Kimberly Sellers is a pleasant 74 y.o. female patient seen today painful callus(es) left foot and painful thick toenails that are difficult to trim. Painful toenails interfere with ambulation. Aggravating factors include wearing enclosed shoe gear. Pain is relieved with periodic professional debridement. Painful calluses are aggravated when weightbearing with and without shoegear. Pain is relieved with periodic professional debridement.   She states she now sees her PCP at [facility]. Dr. Karl Ito and was seen last month. Last visit was 01/07/2020.   Patient Active Problem List   Diagnosis Date Noted  . Supraventricular tachycardia (HCC) 06/10/2012  . Hypokalemia 06/09/2012  . Partial small bowel obstruction (HCC) 06/07/2012  . Sepsis (HCC) 06/03/2012  . Lactic acidosis 06/02/2012  . Abdominal pain 06/01/2012  . Nausea & vomiting 06/01/2012  . Body aches 06/01/2012  . Flu-like symptoms 06/01/2012  . Ileus (HCC) 06/01/2012  . HTN (hypertension) 06/01/2012  . Adrenal nodule (HCC) 06/01/2012   No Known Allergies  Objective: Physical Exam  General: Kimberly Sellers is a pleasant 74 y.o. African American female, in NAD. AAO x 3.   Vascular:  Neurovascular status unchanged b/l lower extremities. Capillary refill time to digits immediate b/l. Palpable DP pulses b/l. Palpable PT pulses b/l. Pedal hair sparse b/l. Skin temperature gradient within normal limits b/l. No edema noted b/l.  Dermatological:  Pedal skin with normal turgor, texture and tone bilaterally. No open wounds bilaterally. Toenails 1-5 b/l elongated, discolored, dystrophic, thickened, crumbly with subungual debris and tenderness to dorsal palpation. Hyperkeratotic lesion(s) L hallux and plantar aspect left heel.  No erythema, no edema, no drainage, no flocculence.  Musculoskeletal:  Normal muscle strength 5/5 to all lower extremity muscle groups bilaterally. No pain crepitus or joint limitation noted  with ROM b/l. No gross bony deformities bilaterally. Pes planus deformity noted b/l.   Neurological:  Protective sensation intact 5/5 intact bilaterally with 10g monofilament b/l. Vibratory sensation intact b/l.  Assessment and Plan:  1. Pain due to onychomycosis of toenails of both feet   2. Callus   3. Pes planus of both feet   4. Prediabetes    -Examined patient. -No new findings. No new orders. -Toenails 1-5 b/l were debrided in length and girth with sterile nail nippers and dremel without iatrogenic bleeding.  -Callus(es) L hallux and plantar aspect of left heel  pared utilizing sterile scalpel blade without complication or incident. Total number debrided =2. -Patient to report any pedal injuries to medical professional immediately. -Patient to continue soft, supportive shoe gear daily. -Patient/POA to call should there be question/concern in the interim.  Return in about 3 months (around 08/03/2020).  Freddie Breech, DPM

## 2020-08-05 ENCOUNTER — Ambulatory Visit: Payer: Medicare Other | Admitting: Podiatry

## 2020-08-11 ENCOUNTER — Other Ambulatory Visit: Payer: Self-pay

## 2020-08-11 ENCOUNTER — Encounter: Payer: Self-pay | Admitting: Podiatry

## 2020-08-11 ENCOUNTER — Ambulatory Visit (INDEPENDENT_AMBULATORY_CARE_PROVIDER_SITE_OTHER): Payer: Medicare Other | Admitting: Podiatry

## 2020-08-11 DIAGNOSIS — M79675 Pain in left toe(s): Secondary | ICD-10-CM

## 2020-08-11 DIAGNOSIS — M2141 Flat foot [pes planus] (acquired), right foot: Secondary | ICD-10-CM

## 2020-08-11 DIAGNOSIS — L84 Corns and callosities: Secondary | ICD-10-CM

## 2020-08-11 DIAGNOSIS — M2142 Flat foot [pes planus] (acquired), left foot: Secondary | ICD-10-CM

## 2020-08-11 DIAGNOSIS — B351 Tinea unguium: Secondary | ICD-10-CM

## 2020-08-11 DIAGNOSIS — M79674 Pain in right toe(s): Secondary | ICD-10-CM

## 2020-08-11 NOTE — Progress Notes (Signed)
This patient returns to the office for evaluation and treatment of long thick painful nails .  This patient is unable to trim her own nails since the patient cannot reach her feet.  Patient says the nails are painful walking and wearing his shoes.  Patient has painful pinch callus left foot.  He returns for preventive foot care services.  General Appearance  Alert, conversant and in no acute stress.  Vascular  Dorsalis pedis and posterior tibial  pulses are palpable  bilaterally.  Capillary return is within normal limits  bilaterally. Temperature is within normal limits  bilaterally.  Neurologic  Senn-Weinstein monofilament wire test within normal limits  bilaterally. Muscle power within normal limits bilaterally.  Nails Thick disfigured discolored nails with subungual debris  from hallux to fifth toes bilaterally. No evidence of bacterial infection or drainage bilaterally.  Orthopedic  No limitations of motion  feet .  No crepitus or effusions noted.  No bony pathology or digital deformities noted.  HAV  B/L  Skin  normotropic skin with no porokeratosis noted bilaterally.  No signs of infections or ulcers noted.     Onychomycosis  Pain in toes right foot  Pain in toes left foot  Pinch callus left foot.  Debridement  of nails  1-5  B/L with a nail nipper.  Nails were then filed using a dremel tool with no incidents. Debride pinch callus with # 15 blade.   RTC 3 months   Helane Gunther DPM

## 2020-09-16 ENCOUNTER — Other Ambulatory Visit (HOSPITAL_COMMUNITY): Payer: Self-pay | Admitting: Student

## 2020-09-16 DIAGNOSIS — R011 Cardiac murmur, unspecified: Secondary | ICD-10-CM

## 2020-09-17 ENCOUNTER — Other Ambulatory Visit: Payer: Self-pay | Admitting: General Practice

## 2020-09-17 ENCOUNTER — Other Ambulatory Visit: Payer: Self-pay | Admitting: Student

## 2020-09-17 DIAGNOSIS — Z1231 Encounter for screening mammogram for malignant neoplasm of breast: Secondary | ICD-10-CM

## 2020-10-13 ENCOUNTER — Other Ambulatory Visit: Payer: Self-pay

## 2020-10-13 ENCOUNTER — Ambulatory Visit (HOSPITAL_COMMUNITY): Payer: Medicare Other | Attending: Internal Medicine

## 2020-10-13 DIAGNOSIS — R011 Cardiac murmur, unspecified: Secondary | ICD-10-CM | POA: Diagnosis present

## 2020-10-13 LAB — ECHOCARDIOGRAM COMPLETE
AR max vel: 1.95 cm2
AV Area VTI: 1.76 cm2
AV Area mean vel: 1.88 cm2
AV Mean grad: 8 mmHg
AV Peak grad: 16.4 mmHg
Ao pk vel: 2.03 m/s
Area-P 1/2: 2.91 cm2
P 1/2 time: 706 msec
S' Lateral: 3.1 cm

## 2020-11-11 ENCOUNTER — Ambulatory Visit
Admission: RE | Admit: 2020-11-11 | Discharge: 2020-11-11 | Disposition: A | Discharge status: Home / Self Care | Payer: Medicare Other | Source: Ambulatory Visit | Attending: Student | Admitting: Student

## 2020-11-11 ENCOUNTER — Other Ambulatory Visit: Payer: Self-pay

## 2020-11-11 DIAGNOSIS — Z1231 Encounter for screening mammogram for malignant neoplasm of breast: Secondary | ICD-10-CM

## 2020-11-25 ENCOUNTER — Other Ambulatory Visit: Payer: Self-pay

## 2020-11-25 ENCOUNTER — Ambulatory Visit (INDEPENDENT_AMBULATORY_CARE_PROVIDER_SITE_OTHER): Payer: Medicare Other | Admitting: Podiatry

## 2020-11-25 DIAGNOSIS — R7303 Prediabetes: Secondary | ICD-10-CM | POA: Diagnosis not present

## 2020-11-25 DIAGNOSIS — L84 Corns and callosities: Secondary | ICD-10-CM | POA: Diagnosis not present

## 2020-11-25 DIAGNOSIS — M79674 Pain in right toe(s): Secondary | ICD-10-CM

## 2020-11-25 DIAGNOSIS — M79675 Pain in left toe(s): Secondary | ICD-10-CM | POA: Diagnosis not present

## 2020-11-25 DIAGNOSIS — B351 Tinea unguium: Secondary | ICD-10-CM

## 2020-11-28 ENCOUNTER — Encounter: Payer: Self-pay | Admitting: Podiatry

## 2020-11-28 NOTE — Progress Notes (Signed)
Subjective: Kimberly Sellers is a pleasant 75 y.o. female patient seen today for h/o calluses left foot and painful thick toenails that are difficult to trim. Pain interferes with ambulation. Aggravating factors include wearing enclosed shoe gear. Pain is relieved with periodic professional debridement.  She notes no new pedal problems on today's visit.  PCP is Hillery Aldo, NP.  No Known Allergies  Objective: Physical Exam  General: Kimberly Sellers is a pleasant 75 y.o. African American female, WD, WN in NAD. AAO x 3.   Vascular:  Capillary refill time to digits immediate b/l. Palpable pedal pulses b/l LE. Pedal hair sparse. Lower extremity skin temperature gradient within normal limits. No pain with calf compression b/l. No edema noted b/l lower extremities.  Dermatological:  Pedal skin with normal turgor, texture and tone b/l lower extremities. No open wounds b/l lower extremities. No interdigital macerations b/l lower extremities. Toenails 1-5 b/l elongated, discolored, dystrophic, thickened, crumbly with subungual debris and tenderness to dorsal palpation. Hyperkeratotic lesion(s) L hallux and plantar aspect of heel left foot.  No erythema, no edema, no drainage, no fluctuance.  Musculoskeletal:  Normal muscle strength 5/5 to all lower extremity muscle groups bilaterally. No pain crepitus or joint limitation noted with ROM b/l. Pes planus deformity noted b/l.   Neurological:  Protective sensation intact 5/5 intact bilaterally with 10g monofilament b/l. Vibratory sensation intact b/l.  Assessment and Plan:  1. Pain due to onychomycosis of toenails of both feet   2. Callus   3. Prediabetes     -Examined patient. -No new findings. No new orders. -Patient to continue soft, supportive shoe gear daily. -Toenails 1-5 b/l were debrided in length and girth with sterile nail nippers and dremel without iatrogenic bleeding.  -Callus(es) L hallux and plantar aspect of heel left foot  pared utilizing sterile scalpel blade without complication or incident. Total number debrided =2. -Patient to report any pedal injuries to medical professional immediately. -Patient/POA to call should there be question/concern in the interim.  Return in about 3 months (around 02/25/2021).  Freddie Breech, DPM

## 2021-02-06 IMAGING — DX DG HIP (WITH OR WITHOUT PELVIS) 2-3V*L*
3 series · 3 of 3 positions shown · non-contrast
Comparison: Abdominal radiograph 06/02/2012

CLINICAL DATA: Fell 3 days ago a retail store, LEFT hip pain with
limited range of motion, unable to bear full weight

EXAM:
DG HIP (WITH OR WITHOUT PELVIS) 2-3V LEFT

[pelvis ap]
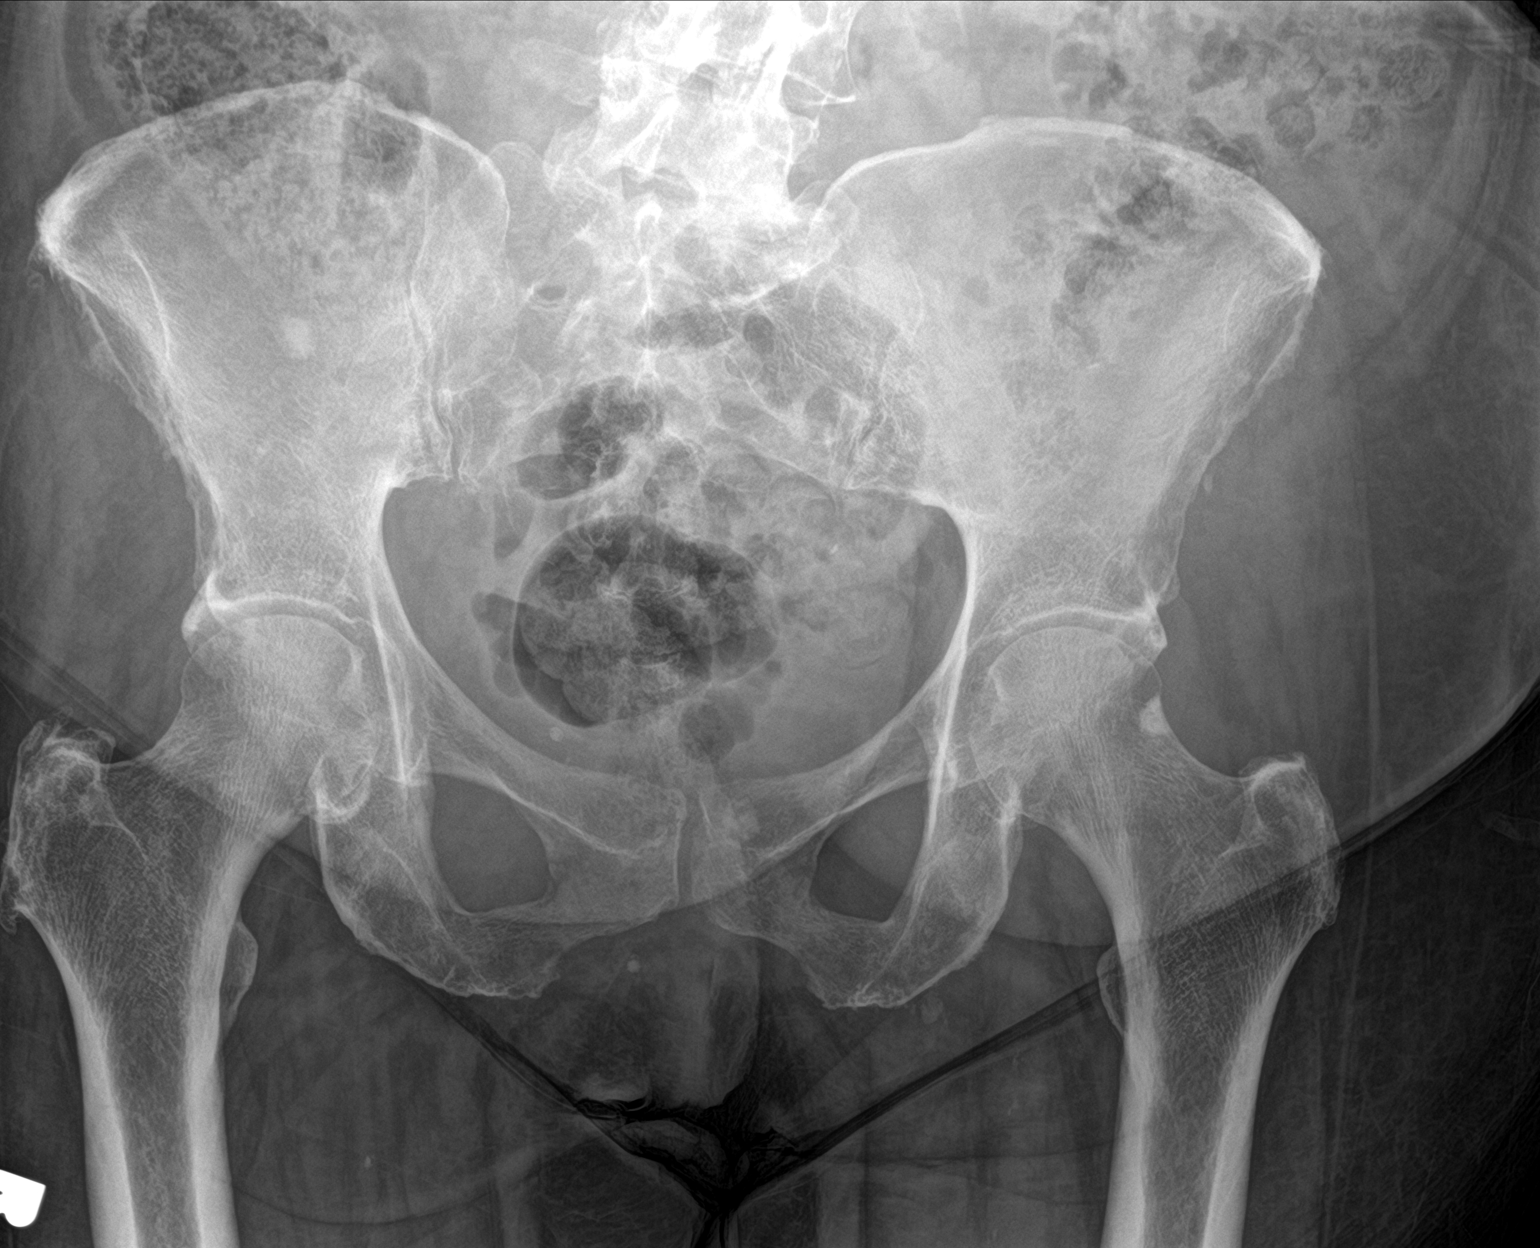

[hip ap]
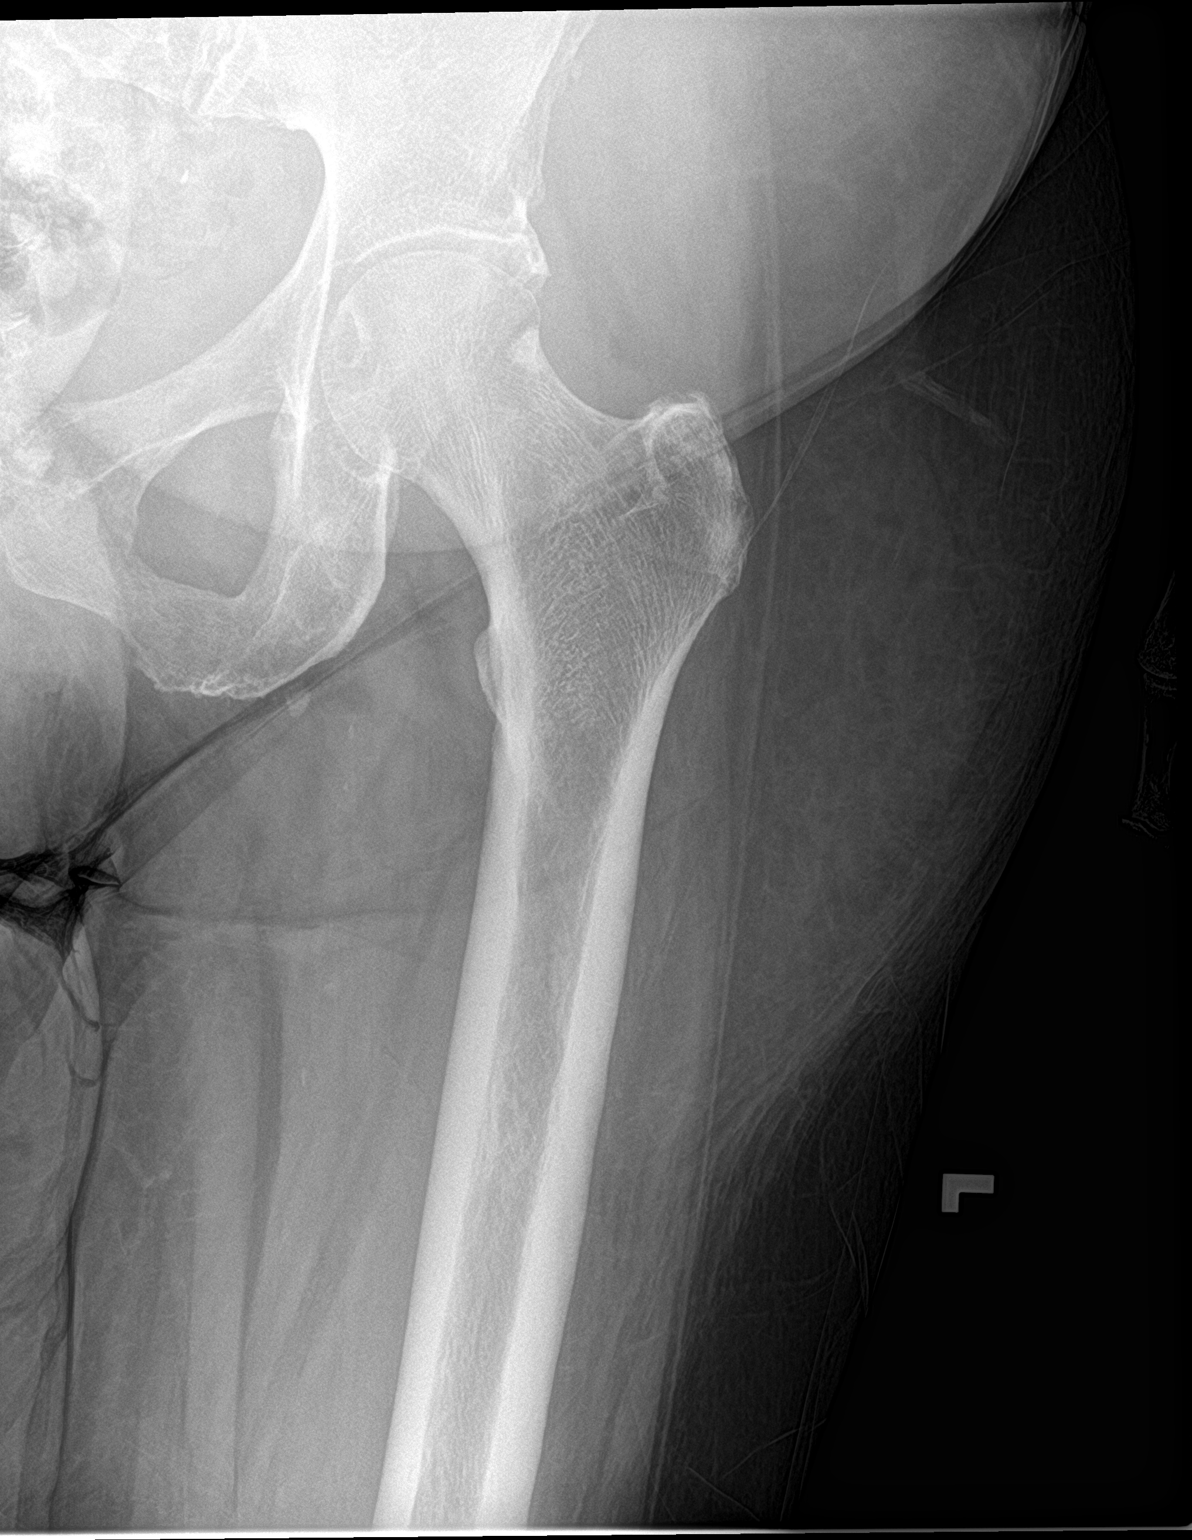

[hip lat]
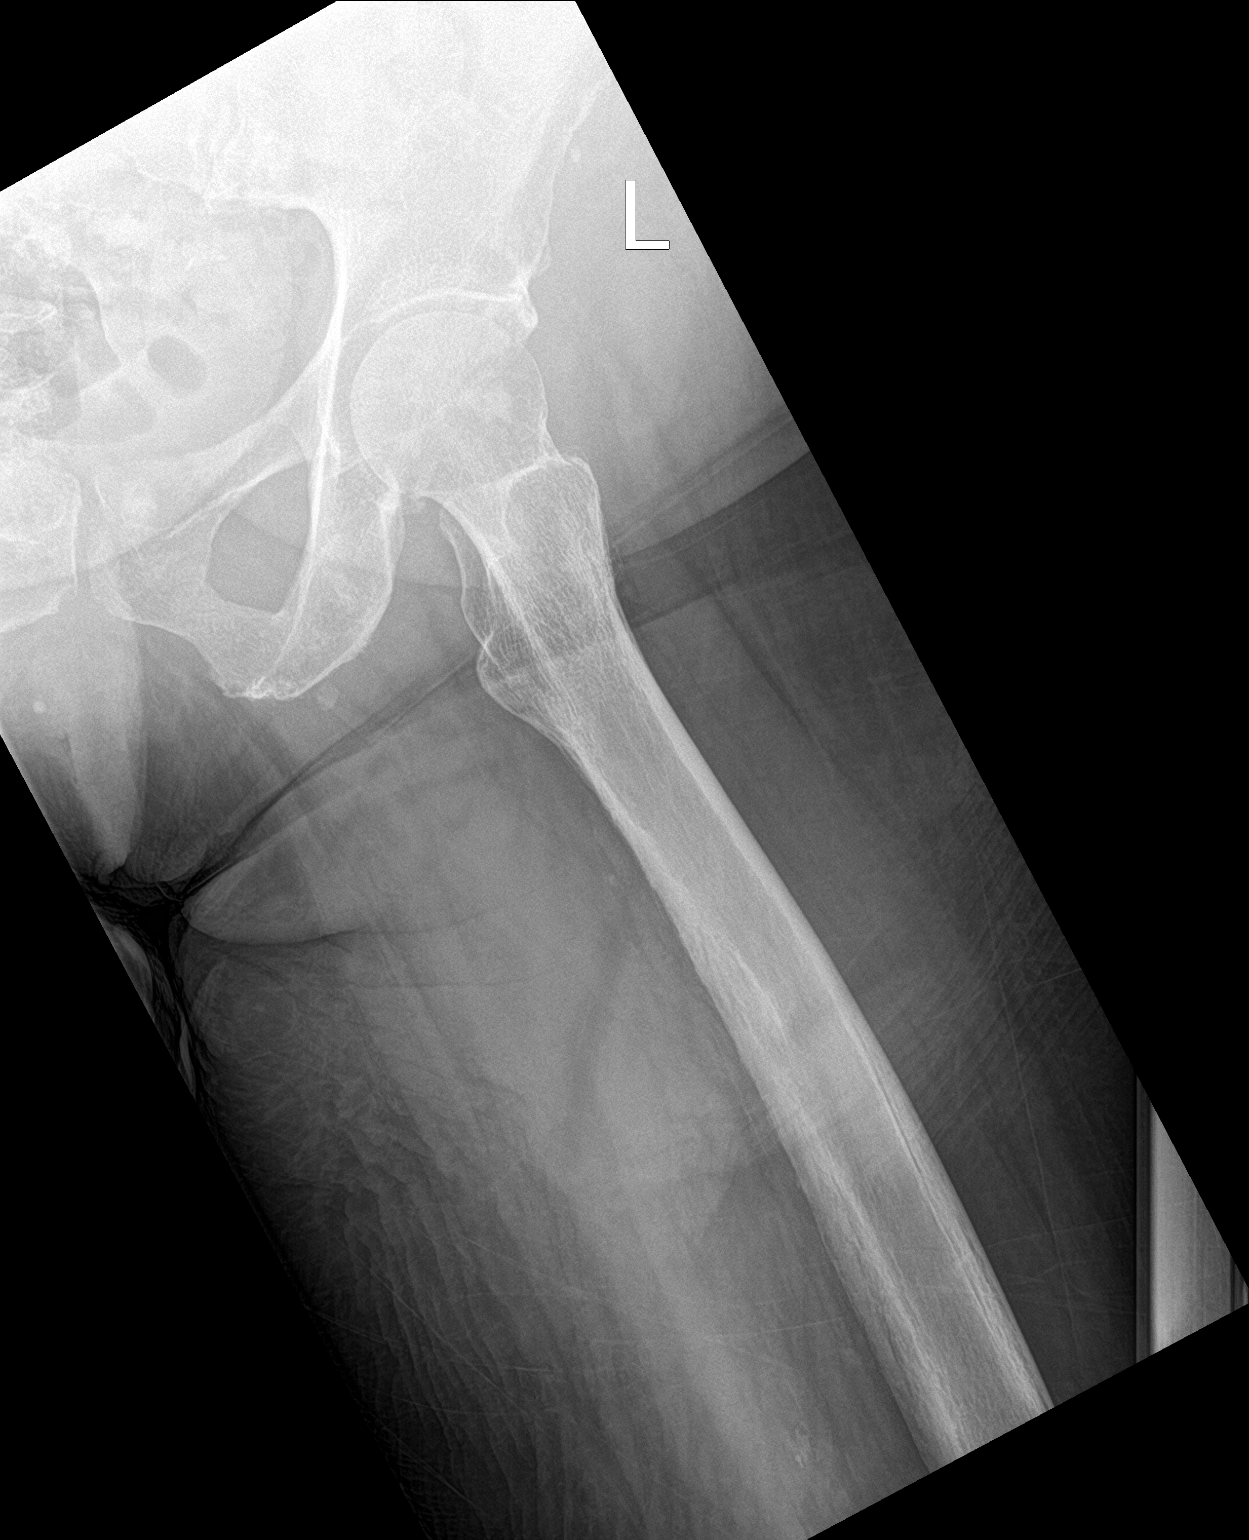

[3 of 3 positions shown; findings below may reference images not displayed]

FINDINGS: Osseous demineralization.

Bone islands at RIGHT iliac bone and LEFT femoral neck again seen.

Hip and SI joint spaces preserved.

No acute fracture, dislocation, or bone destruction.

Facet degenerative changes lower lumbar spine.
IMPRESSION: No acute osseous abnormalities.

## 2021-03-03 ENCOUNTER — Encounter: Payer: Self-pay | Admitting: Podiatry

## 2021-03-03 ENCOUNTER — Other Ambulatory Visit: Payer: Self-pay

## 2021-03-03 ENCOUNTER — Ambulatory Visit (INDEPENDENT_AMBULATORY_CARE_PROVIDER_SITE_OTHER): Payer: Medicare Other | Admitting: Podiatry

## 2021-03-03 DIAGNOSIS — M79674 Pain in right toe(s): Secondary | ICD-10-CM

## 2021-03-03 DIAGNOSIS — L84 Corns and callosities: Secondary | ICD-10-CM

## 2021-03-03 DIAGNOSIS — R7303 Prediabetes: Secondary | ICD-10-CM | POA: Diagnosis not present

## 2021-03-03 DIAGNOSIS — B351 Tinea unguium: Secondary | ICD-10-CM | POA: Diagnosis not present

## 2021-03-03 DIAGNOSIS — M79675 Pain in left toe(s): Secondary | ICD-10-CM | POA: Diagnosis not present

## 2021-03-07 NOTE — Progress Notes (Signed)
Subjective: Kimberly Sellers is a 75 y.o. female patient seen today for follow up of  painful thick toenails that are difficult to trim. Pain interferes with ambulation. Aggravating factors include wearing enclosed shoe gear. Pain is relieved with periodic professional debridement.  New problems reported today: None.  PCP is Hillery Aldo, NP. Last visit was: August, 2022.  No Known Allergies  Objective: Physical Exam  General: Patient is a pleasant 75 y.o. African American female WD, WN in NAD. AAO x 3.   Neurovascular Examination: Capillary refill time to digits immediate b/l. Palpable DP pulse(s) b/l lower extremities Palpable PT pulse(s) b/l lower extremities Pedal hair sparse. Lower extremity skin temperature gradient within normal limits. No pain with calf compression b/l. No edema noted b/l lower extremities.  Protective sensation intact 5/5 intact bilaterally with 10g monofilament b/l.  Dermatological:  Skin warm and supple b/l lower extremities. No open wounds b/l LE. No interdigital macerations b/l lower extremities. Toenails 1-5 b/l elongated, discolored, dystrophic, thickened, crumbly with subungual debris and tenderness to dorsal palpation. Hyperkeratotic lesion(s) L hallux and plantar aspect b/l heel pads.  No erythema, no edema, no drainage, no fluctuance.  Musculoskeletal:  Normal muscle strength 5/5 to all lower extremity muscle groups bilaterally. Pes planus deformity noted b/l feet.  Assessment: 1. Pain due to onychomycosis of toenails of both feet   2. Callus   3. Prediabetes    Plan: Patient was evaluated and treated and all questions answered. Consent given for treatment as described below: -Patient to continue soft, supportive shoe gear daily. -Toenails 1-5 b/l were debrided in length and girth with sterile nail nippers and dremel without iatrogenic bleeding.  -Callus(es) L hallux and plantar aspect b/l heel pads pared utilizing sterile scalpel blade without  complication or incident. Total number debrided =3. -Patient to report any pedal injuries to medical professional immediately. -Patient/POA to call should there be question/concern in the interim.  Return in about 3 months (around 06/03/2021).  Freddie Breech, DPM

## 2021-04-02 DIAGNOSIS — M25562 Pain in left knee: Secondary | ICD-10-CM | POA: Insufficient documentation

## 2021-06-15 ENCOUNTER — Ambulatory Visit (INDEPENDENT_AMBULATORY_CARE_PROVIDER_SITE_OTHER): Payer: Medicare Other | Admitting: Podiatry

## 2021-06-15 ENCOUNTER — Encounter: Payer: Self-pay | Admitting: Podiatry

## 2021-06-15 ENCOUNTER — Other Ambulatory Visit: Payer: Self-pay

## 2021-06-15 DIAGNOSIS — R7303 Prediabetes: Secondary | ICD-10-CM

## 2021-06-15 DIAGNOSIS — Q828 Other specified congenital malformations of skin: Secondary | ICD-10-CM

## 2021-06-15 DIAGNOSIS — B351 Tinea unguium: Secondary | ICD-10-CM | POA: Diagnosis not present

## 2021-06-15 DIAGNOSIS — M79674 Pain in right toe(s): Secondary | ICD-10-CM | POA: Diagnosis not present

## 2021-06-15 DIAGNOSIS — M79675 Pain in left toe(s): Secondary | ICD-10-CM | POA: Diagnosis not present

## 2021-06-20 NOTE — Progress Notes (Signed)
°  Subjective:  Patient ID: Kimberly Sellers, female    DOB: 07-10-1945,  MRN: [id number]  Kimberly Sellers presents to clinic today for painful porokeratotic lesion(s) b/l heels and painful mycotic toenails that limit ambulation. Painful toenails interfere with ambulation. Aggravating factors include wearing enclosed shoe gear. Pain is relieved with periodic professional debridement. Painful porokeratotic lesions are aggravated when weightbearing with and without shoegear. Pain is relieved with periodic professional debridement.  New problem(s): None.   PCP is Hillery Aldo, NP , and last visit was August, 2022.  No Known Allergies  Review of Systems: Negative except as noted in the HPI. Objective:   Constitutional Kimberly Sellers is a pleasant 76 y.o. African American female, WD, WN in NAD. AAO x 3.   Vascular CFT immediate b/l LE. Palpable DP/PT pulses b/l LE. Digital hair sparse b/l. Skin temperature gradient WNL b/l. No pain with calf compression b/l. No edema noted b/l. No cyanosis or clubbing noted b/l LE.  Neurologic Normal speech. Oriented to person, place, and time. Protective sensation intact 5/5 intact bilaterally with 10g monofilament b/l.  Dermatologic Pedal integument with normal turgor, texture and tone b/l LE. No open wounds b/l. No interdigital macerations b/l. Toenails 1-5 b/l elongated, thickened, discolored with subungual debris. +Tenderness with dorsal palpation of nailplates. Porokeratotic lesion(s) noted bilateral heels and L hallux.  Orthopedic: Normal muscle strength 5/5 to all lower extremity muscle groups bilaterally. Pes planus deformity noted bilateral LE.Marland Kitchen No pain, crepitus or joint limitation noted with ROM b/l LE.  Patient ambulates independently without assistive aids.   Radiographs: None  Last A1c: No flowsheet data found.   Assessment:  No diagnosis found. Plan:  Patient was evaluated and treated and all questions answered. Consent given for treatment  as described below: -Mycotic toenails 1-5 bilaterally were debrided in length and girth with sterile nail nippers and dremel without incident. -Painful porokeratotic lesion(s) bilateral heels and L hallux pared and enucleated with sterile scalpel blade without incident. Total number of lesions debrided=3. -Patient/POA to call should there be question/concern in the interim.  Return in about 3 months (around 09/12/2021).  Freddie Breech, DPM

## 2021-08-19 DIAGNOSIS — M25552 Pain in left hip: Secondary | ICD-10-CM | POA: Insufficient documentation

## 2021-09-09 DIAGNOSIS — M1712 Unilateral primary osteoarthritis, left knee: Secondary | ICD-10-CM | POA: Insufficient documentation

## 2021-09-14 ENCOUNTER — Ambulatory Visit: Payer: Medicare Other | Admitting: Podiatry

## 2021-10-12 ENCOUNTER — Encounter: Payer: Self-pay | Admitting: Podiatry

## 2021-10-12 ENCOUNTER — Ambulatory Visit (INDEPENDENT_AMBULATORY_CARE_PROVIDER_SITE_OTHER): Payer: Medicare Other | Admitting: Podiatry

## 2021-10-12 DIAGNOSIS — B351 Tinea unguium: Secondary | ICD-10-CM | POA: Diagnosis not present

## 2021-10-12 DIAGNOSIS — L84 Corns and callosities: Secondary | ICD-10-CM | POA: Diagnosis not present

## 2021-10-12 DIAGNOSIS — M79675 Pain in left toe(s): Secondary | ICD-10-CM | POA: Diagnosis not present

## 2021-10-12 DIAGNOSIS — R7303 Prediabetes: Secondary | ICD-10-CM | POA: Diagnosis not present

## 2021-10-12 DIAGNOSIS — M79674 Pain in right toe(s): Secondary | ICD-10-CM | POA: Diagnosis not present

## 2021-10-12 DIAGNOSIS — Q828 Other specified congenital malformations of skin: Secondary | ICD-10-CM | POA: Diagnosis not present

## 2021-10-14 NOTE — Progress Notes (Signed)
  Subjective:  Patient ID: Kimberly Sellers, female    DOB: 12/27/1945,  MRN: [id number]  Kimberly Sellers presents to clinic today for follow up with h/o prediabetes and callus(es) b/l lower extremities and painful thick toenails that are difficult to trim. Painful toenails interfere with ambulation. Aggravating factors include wearing enclosed shoe gear. Pain is relieved with periodic professional debridement. Painful calluses are aggravated when weightbearing with and without shoegear. Pain is relieved with periodic professional debridement.  Patient states blood glucose was 100 mg/dl today.    Last known HgA1c was 5.4%.  New problem(s): None.   PCP is Hillery Aldo, NP , and last visit was March, 2023.  No Known Allergies  Review of Systems: Negative except as noted in the HPI.  Objective: Constitutional Kimberly Sellers is a pleasant 76 y.o. African American female, WD, WN in NAD. AAO x 3.   Vascular CFT immediate b/l LE. Palpable DP/PT pulses b/l LE. Digital hair sparse b/l. Skin temperature gradient WNL b/l. No pain with calf compression b/l. No edema noted b/l. No cyanosis or clubbing noted b/l LE.  Neurologic Normal speech. Oriented to person, place, and time. Protective sensation intact 5/5 intact bilaterally with 10g monofilament b/l.  Dermatologic Pedal integument with normal turgor, texture and tone b/l LE. No open wounds b/l. No interdigital macerations b/l. Toenails 1-5 b/l elongated, thickened, discolored with subungual debris. +Tenderness with dorsal palpation of nailplates. Porokeratotic lesion(s) noted bilateral heels. Hyperkeratotic lesion medial IPJ of  L hallux. No erythema, no edema, no drainage, no fluctuance.  Orthopedic: Normal muscle strength 5/5 to all lower extremity muscle groups bilaterally. Pes planus deformity noted bilateral LE. No pain, crepitus or joint limitation noted with ROM b/l LE.  Patient ambulates with cane assistance.   Radiographs:  None Assessment/Plan: 1. Pain due to onychomycosis of toenails of both feet   2. Porokeratosis   3. Callus   4. Prediabetes      -Patient was evaluated and treated. All patient's and/or POA's questions/concerns answered on today's visit. -Patient will be having knee replacement of LLE in late June. -Patient to continue soft, supportive shoe gear daily. -Toenails 1-5 b/l were debrided in length and girth with sterile nail nippers and dremel without iatrogenic bleeding.  -Callus(es) left great toe pared utilizing sterile scalpel blade without complication or incident. Total number debrided =1. -Porokeratotic lesion(s) bilateral heels pared and enucleated with sterile scalpel blade without incident. Total number of lesions debrided=2. -Patient/POA to call should there be question/concern in the interim.   Return in about 3 months (around 01/12/2022).  Freddie Breech, DPM

## 2021-12-16 ENCOUNTER — Ambulatory Visit (INDEPENDENT_AMBULATORY_CARE_PROVIDER_SITE_OTHER): Payer: Medicare Other | Admitting: Interventional Cardiology

## 2021-12-16 ENCOUNTER — Encounter: Payer: Self-pay | Admitting: Interventional Cardiology

## 2021-12-16 ENCOUNTER — Encounter: Payer: Self-pay | Admitting: *Deleted

## 2021-12-16 VITALS — BP 130/80 | HR 78 | Ht 65.0 in | Wt 187.8 lb

## 2021-12-16 DIAGNOSIS — M79604 Pain in right leg: Secondary | ICD-10-CM

## 2021-12-16 DIAGNOSIS — M79605 Pain in left leg: Secondary | ICD-10-CM | POA: Diagnosis not present

## 2021-12-16 DIAGNOSIS — Z01810 Encounter for preprocedural cardiovascular examination: Secondary | ICD-10-CM

## 2021-12-16 DIAGNOSIS — I10 Essential (primary) hypertension: Secondary | ICD-10-CM | POA: Diagnosis not present

## 2021-12-16 DIAGNOSIS — R079 Chest pain, unspecified: Secondary | ICD-10-CM

## 2021-12-16 NOTE — Progress Notes (Signed)
Cardiology Office Note   Date:  12/16/2021   ID:  Kimberly Sellers, DOB 1946-04-10, MRN [id number]  PCP:  Hillery Aldo, NP    No chief complaint on file.  Chest pain  Wt Readings from Last 3 Encounters:  12/16/21 187 lb 12.8 oz (85.2 kg)  04/06/20 184 lb (83.5 kg)  06/18/15 222 lb 4.8 oz (100.8 kg)       History of Present Illness: Kimberly Sellers is a 76 y.o. female who is being seen today for the evaluation of chest pain at the request of Hillery Aldo, NP.   She reported chest pain that occurred several months ago.  She reports some pulling in her chest when she gets "tired out."  Does not have to walk up stairs on a regular basis.  She has several risk factors for heart disease.  She has known aortic atherosclerosis.  There is some evidence of peripheral arterial disease based on testing from her primary care doctor.  She has been on rosuvastatin 10 mg daily.  She had an echocardiogram in July 2023 showing EF 60 to 65%, grade 1 diastolic dysfunction, trivial mitral valve regurgitation, trivial aortic valve regurgitation, mild to moderate aortic valve sclerosis without stenosis.  This result comes from the records provided by the primary care doctor.  I do not have the echo images available to me.  Blood pressure has been managed with amlodipine 10 mg daily, lisinopril/HCTZ 20/25 mg daily.  She is a retired Engineer, civil (consulting).  She does a lot of volunteer work.  She raised her kids and several kids from her siblings who passed away.   Denies : exertional Chest pain. Dizziness. Leg edema. Nitroglycerin use. Orthopnea. Palpitations. Paroxysmal nocturnal dyspnea. Shortness of breath. Syncope.      Past Medical History:  Diagnosis Date   Anxiety    Hypertension     Past Surgical History:  Procedure Laterality Date   CHOLECYSTECTOMY       Current Outpatient Medications  Medication Sig Dispense Refill   acetaminophen-codeine (TYLENOL #3) 300-30 MG tablet as needed.      amLODipine (NORVASC) 10 MG tablet Take 10 mg by mouth daily.     aspirin EC 81 MG tablet      Cholecalciferol (VITAMIN D3) 2000 units capsule Take 2,000 Units by mouth daily.     cycloSPORINE (RESTASIS) 0.05 % ophthalmic emulsion Place 1 drop into both eyes daily as needed. For dry eyes per patient     fluticasone (FLONASE) 50 MCG/ACT nasal spray SMARTSIG:1 Spray(s) Both Nares Daily PRN     lisinopril-hydrochlorothiazide (PRINZIDE,ZESTORETIC) 20-25 MG tablet Take 1 tablet by mouth daily.     loratadine (CLARITIN) 10 MG tablet Take 10 mg by mouth daily.     meloxicam (MOBIC) 7.5 MG tablet Take 7.5 mg by mouth daily as needed.     metFORMIN (GLUCOPHAGE) 500 MG tablet Take 500 mg by mouth 2 (two) times daily.     Multiple Vitamin (MULTIVITAMIN PO)      Omega-3 Fatty Acids (OMEGA 3 PO)      pantoprazole (PROTONIX) 20 MG tablet Take 20 mg by mouth daily.     potassium chloride SA (K-DUR,KLOR-CON) 20 MEQ tablet Take 1 tablet (20 mEq total) by mouth daily. 7 tablet 0   amoxicillin (AMOXIL) 500 MG capsule  (Patient not taking: Reported on 12/16/2021)     amoxicillin-clavulanate (AUGMENTIN) 875-125 MG tablet  (Patient not taking: Reported on 12/16/2021)     chlorhexidine (PERIDEX) 0.12 %  solution  (Patient not taking: Reported on 12/16/2021)     Cholecalciferol (VITAMIN D-3 PO)  (Patient not taking: Reported on 12/16/2021)     cyclobenzaprine (FLEXERIL) 10 MG tablet  (Patient not taking: Reported on 12/16/2021)     diclofenac Sodium (VOLTAREN) 1 % GEL  (Patient not taking: Reported on 12/16/2021)     ibuprofen (ADVIL) 800 MG tablet Take 800 mg by mouth every 6 (six) hours as needed. (Patient not taking: Reported on 12/16/2021)     LISINOPRIL PO  (Patient not taking: Reported on 12/16/2021)     methocarbamol (ROBAXIN) 500 MG tablet Take 1 tablet (500 mg total) by mouth every 8 (eight) hours as needed for muscle spasms. (Patient not taking: Reported on 12/16/2021) 6 tablet 0   methylPREDNISolone (MEDROL DOSEPAK) 4 MG TBPK  tablet  (Patient not taking: Reported on 12/16/2021)     naproxen (NAPROSYN) 500 MG tablet Take 500 mg by mouth 2 (two) times daily. (Patient not taking: Reported on 12/16/2021)     NAPROXEN PO  (Patient not taking: Reported on 12/16/2021)     pantoprazole (PROTONIX) 40 MG tablet Take 40 mg by mouth daily. (Patient not taking: Reported on 12/16/2021)     penicillin v potassium (VEETID) 500 MG tablet Take 500 mg by mouth 4 (four) times daily. (Patient not taking: Reported on 12/16/2021)     QUEtiapine (SEROQUEL) 25 MG tablet Take 25 mg by mouth at bedtime. Reported on 11/25/2015 (Patient not taking: Reported on 12/16/2021)     rosuvastatin (CRESTOR) 10 MG tablet Take 10 mg by mouth at bedtime. (Patient not taking: Reported on 12/16/2021)     traMADol (ULTRAM) 50 MG tablet Take 50 mg by mouth daily as needed. (Patient not taking: Reported on 12/16/2021)     TRAMADOL HCL PO  (Patient not taking: Reported on 12/16/2021)     No current facility-administered medications for this visit.    Allergies:   Patient has no known allergies.    Social History:  The patient  reports that she has never smoked. She has never used smokeless tobacco. She reports that she does not drink alcohol and does not use drugs.   Family History:  The patient's family history includes Breast cancer in her cousin.    ROS:  Please see the history of present illness.   Otherwise, review of systems are positive for shoulder pains; knee pain.   All other systems are reviewed and negative.    PHYSICAL EXAM: VS:  BP 130/80   Pulse 78   Ht 5\' 5"  (1.651 m)   Wt 187 lb 12.8 oz (85.2 kg)   SpO2 98%   BMI 31.25 kg/m  , BMI Body mass index is 31.25 kg/m. GEN: Well nourished, well developed, in no acute distress HEENT: normal Neck: no JVD, carotid bruits, or masses Cardiac: RRR; no murmurs, rubs, or gallops,no edema  Respiratory:  clear to auscultation bilaterally, normal work of breathing GI: soft, nontender, nondistended, + BS MS: no  deformity or atrophy Skin: warm and dry, no rash Neuro:  Strength and sensation are intact Psych: euthymic mood, full affect   EKG:   The ekg ordered today demonstrates NSR, left axis deviation, nonspecific ST changes   Recent Labs: No results found for requested labs within last 365 days.   Lipid Panel    Component Value Date/Time   CHOL 157 01/31/2008 2017   TRIG 111 01/31/2008 2017   HDL 42 01/31/2008 2017   CHOLHDL 3.7 Ratio 01/31/2008 2017  VLDL 22 01/31/2008 2017   LDLCALC 93 01/31/2008 2017     Other studies Reviewed: Additional studies/ records that were reviewed today with results demonstrating: labs reviewed.   ASSESSMENT AND PLAN:  Preoperative cardiovascular exam: Does report some precordial chest pain.  Somewhat atypical but given that she cannot reach 4 METS of exercise due to her knee pain, will plan for pharmacologic nuclear stress test.  Last stress test was in 2003 and was normal.  Normal EF.  Only high risk stress test finding would postpone her surgery. Leg pain: Plan for lower extremity arterial Doppler to rule out significant PAD, especially since the surgery is planned. Hypertension: Continue current blood pressure medicines. DM2: managed by PMD.  Per the patient, her A1c was down to below 6.  She has had significant weight loss which may have helped. Aortic atherosclerosis: Looks like she had been on Crestor in the past but is no longer on this medicine.  She was told to stop this medicine a few weeks ago because her cholesterol was very low.  Given her aortic atherosclerosis and diabetes, she should stay on this medicine.   Current medicines are reviewed at length with the patient today.  The patient concerns regarding her medicines were addressed.  The following changes have been made:  No change  Labs/ tests ordered today include:  No orders of the defined types were placed in this encounter.   Recommend 150 minutes/week of aerobic  exercise Low fat, low carb, high fiber diet recommended  Disposition:   FU in 1 year, and for stress test- sooner if abnormal stress test   Signed, Lance Muss, MD  12/16/2021 11:10 AM    [facility] [address] [place], [place], [place]  [address] Phone: [phone]; Fax: [phone]

## 2021-12-16 NOTE — Patient Instructions (Signed)
Medication Instructions:  Your physician recommends that you continue on your current medications as directed. Please refer to the Current Medication list given to you today.  *If you need a refill on your cardiac medications before your next appointment, please call your pharmacy*   Lab Work: none If you have labs (blood work) drawn today and your tests are completely normal, you will receive your results only by: MyChart Message (if you have MyChart) OR A paper copy in the mail If you have any lab test that is abnormal or we need to change your treatment, we will call you to review the results.   Testing/Procedures: Your physician has requested that you have a lexiscan myoview. For further information please visit [email or web address] Please follow instruction sheet, as given.  Your physician has requested that you have a lower or upper extremity arterial duplex. This test is an ultrasound of the arteries in the legs or arms. It looks at arterial blood flow in the legs and arms. Allow one hour for Lower and Upper Arterial scans. There are no restrictions or special instructions    Follow-Up: At [facility], you and your health needs are our priority.  As part of our continuing mission to provide you with exceptional heart care, we have created designated Provider Care Teams.  These Care Teams include your primary Cardiologist (physician) and Advanced Practice Providers (APPs -  Physician Assistants and Nurse Practitioners) who all work together to provide you with the care you need, when you need it.  We recommend signing up for the patient portal called "MyChart".  Sign up information is provided on this After Visit Summary.  MyChart is used to connect with patients for Virtual Visits (Telemedicine).  Patients are able to view lab/test results, encounter notes, upcoming appointments, etc.  Non-urgent messages can be sent to your provider as well.   To learn more about what you can do  with MyChart, go to [email or web address].    Your next appointment:   12 month(s)  The format for your next appointment:   In Person  Provider:   Lance Muss, MD     Other Instructions    Important Information About Sugar

## 2021-12-17 ENCOUNTER — Telehealth (HOSPITAL_COMMUNITY): Payer: Self-pay | Admitting: Radiology

## 2021-12-17 NOTE — Telephone Encounter (Signed)
Left message on voicemail per DPR in reference to upcoming appointment scheduled on 8/8 at 7"30 with detailed instructions given per Myocardial Perfusion Study Information Sheet for the test. LM to arrive 15 minutes early, and that it is imperative to arrive on time for appointment to keep from having the test rescheduled. If you need to cancel or reschedule your appointment, please call the office within 24 hours of your appointment. Failure to do so may result in a cancellation of your appointment, and a $50 no show fee. Phone number given for call back for any questions.

## 2021-12-21 ENCOUNTER — Ambulatory Visit (HOSPITAL_COMMUNITY): Payer: Medicare Other | Attending: Interventional Cardiology

## 2021-12-21 DIAGNOSIS — R079 Chest pain, unspecified: Secondary | ICD-10-CM

## 2021-12-21 LAB — MYOCARDIAL PERFUSION IMAGING
LV dias vol: 85 mL (ref 46–106)
LV sys vol: 37 mL
Nuc Stress EF: 57 %
Peak HR: 106 {beats}/min
Rest HR: 50 {beats}/min
Rest Nuclear Isotope Dose: 9.7 mCi
SDS: 4
SRS: 0
SSS: 4
ST Depression (mm): 0 mm
Stress Nuclear Isotope Dose: 32.4 mCi
TID: 0.93

## 2021-12-21 MED ORDER — TECHNETIUM TC 99M TETROFOSMIN IV KIT
9.7000 | PACK | Freq: Once | INTRAVENOUS | Status: AC | PRN
  Administered 2021-12-21: 9.7 via INTRAVENOUS

## 2021-12-21 MED ORDER — TECHNETIUM TC 99M TETROFOSMIN IV KIT
32.4000 | PACK | Freq: Once | INTRAVENOUS | Status: AC | PRN
  Administered 2021-12-21: 32.4 via INTRAVENOUS

## 2021-12-21 MED ORDER — REGADENOSON 0.4 MG/5ML IV SOLN
0.4000 mg | Freq: Once | INTRAVENOUS | Status: AC
  Administered 2021-12-21: 0.4 mg via INTRAVENOUS

## 2021-12-22 ENCOUNTER — Telehealth: Payer: Self-pay | Admitting: Interventional Cardiology

## 2021-12-22 NOTE — Telephone Encounter (Signed)
Pt returning nurses call regarding test results. Please advise 

## 2021-12-22 NOTE — Telephone Encounter (Signed)
-----   Message from Corky Crafts, MD sent at 12/21/2021  5:16 PM EDT ----- Normal stress test.  No further cardiac testing needed before surgery.

## 2021-12-22 NOTE — Telephone Encounter (Signed)
Patient notified

## 2021-12-24 ENCOUNTER — Other Ambulatory Visit: Payer: Self-pay | Admitting: Interventional Cardiology

## 2021-12-24 ENCOUNTER — Ambulatory Visit (HOSPITAL_COMMUNITY)
Admission: RE | Admit: 2021-12-24 | Discharge: 2021-12-24 | Disposition: A | Discharge status: Home / Self Care | Payer: Medicare Other | Source: Ambulatory Visit | Attending: Cardiovascular Disease | Admitting: Cardiovascular Disease

## 2021-12-24 DIAGNOSIS — M79604 Pain in right leg: Secondary | ICD-10-CM

## 2021-12-24 DIAGNOSIS — I700 Atherosclerosis of aorta: Secondary | ICD-10-CM

## 2021-12-24 DIAGNOSIS — M79605 Pain in left leg: Secondary | ICD-10-CM | POA: Insufficient documentation

## 2022-01-12 ENCOUNTER — Ambulatory Visit: Payer: Medicare Other | Admitting: Podiatry

## 2022-02-04 ENCOUNTER — Telehealth: Payer: Self-pay | Admitting: *Deleted

## 2022-02-04 NOTE — Telephone Encounter (Signed)
   Pre-operative Risk Assessment    Patient Name: Kimberly Sellers  DOB: 18-Jul-1945 MRN: [id number]      Request for Surgical Clearance    Procedure:   LEFT TOTAL KNEE ARTHROPLASTY  Date of Surgery:  Clearance TBD                                 Surgeon:  DR. Rod Can Surgeon's Group or Practice Name:  [facility] Phone number:  [phone] ATTN: [place] Fax number:  [phone]   Type of Clearance Requested:   - Medical ; ASA    Type of Anesthesia:  Spinal   Additional requests/questions:    Jiles Prows   02/04/2022, 2:49 PM

## 2022-02-08 NOTE — Telephone Encounter (Signed)
   Patient Name: Kimberly Sellers  DOB: Aug 11, 1945 MRN: [id number]  Primary Cardiologist: Larae Grooms, MD  Chart reviewed as part of pre-operative protocol coverage. Given past medical history and time since last visit, based on ACC/AHA guidelines, [name] would be at acceptable risk for the planned procedure without further cardiovascular testing.   Seen 01/12/22 by Dr. Irish Lack: "Does report some precordial chest pain.  Somewhat atypical but given that she cannot reach 4 METS of exercise due to her knee pain, will plan for pharmacologic nuclear stress test.  Last stress test was in 2003 and was normal.  Normal EF.  Only high risk stress test finding would postpone her surgery."  Subsequent myoview 12/21/21 low risk study with no indication for additional cardiovascular testing prior to surgery.   May hold aspirin 7 days prior.   The patient was advised that if she develops new symptoms prior to surgery to contact our office to arrange for a follow-up visit, and she verbalized understanding.  I will route this recommendation to the requesting party via Epic fax function and remove from pre-op pool.  Please call with questions.  Loel Dubonnet, NP 02/08/2022, 3:59 PM

## 2022-02-11 ENCOUNTER — Encounter: Payer: Self-pay | Admitting: Podiatry

## 2022-02-11 ENCOUNTER — Ambulatory Visit (INDEPENDENT_AMBULATORY_CARE_PROVIDER_SITE_OTHER): Payer: Medicare Other | Admitting: Podiatry

## 2022-02-11 DIAGNOSIS — B351 Tinea unguium: Secondary | ICD-10-CM | POA: Diagnosis not present

## 2022-02-11 DIAGNOSIS — R7303 Prediabetes: Secondary | ICD-10-CM | POA: Diagnosis not present

## 2022-02-11 DIAGNOSIS — L84 Corns and callosities: Secondary | ICD-10-CM | POA: Diagnosis not present

## 2022-02-11 DIAGNOSIS — M79674 Pain in right toe(s): Secondary | ICD-10-CM

## 2022-02-11 DIAGNOSIS — M79675 Pain in left toe(s): Secondary | ICD-10-CM

## 2022-02-12 NOTE — Progress Notes (Signed)
  Subjective:  Patient ID: Kimberly Sellers, female    DOB: [birth year],  MRN: [id number]  Kimberly Sellers presents to clinic today for:  Chief Complaint  Patient presents with   Nail Problem    Diabetic foot care BS-[id number] A1C-5.4 PCP-Shanna Stowe PCP VST-10/2021    New problem(s): None.   No Known Allergies  Review of Systems: Negative except as noted in the HPI.  Objective:  Kimberly Sellers is a pleasant 76 y.o. female in NAD. AAO x 3. Vascular CFT immediate b/l LE. Palpable DP/PT pulses b/l LE. Digital hair sparse b/l. Skin temperature gradient WNL b/l. No pain with calf compression b/l. No edema noted b/l. No cyanosis or clubbing noted b/l LE.  Neurologic Normal speech. Oriented to person, place, and time. Protective sensation intact 5/5 intact bilaterally with 10g monofilament b/l.  Dermatologic Pedal integument with normal turgor, texture and tone b/l LE. No open wounds b/l. No interdigital macerations b/l. Toenails 1-5 b/l elongated, thickened, discolored with subungual debris. +Tenderness with dorsal palpation of nailplates.   Hyperkeratotic lesion medial IPJ of  L hallux. No erythema, no edema, no drainage, no fluctuance.  Orthopedic: Normal muscle strength 5/5 to all lower extremity muscle groups bilaterally. Pes planus deformity noted bilateral LE. No pain, crepitus or joint limitation noted with ROM b/l LE.  Patient ambulates with cane assistance.   Radiographs: None Assessment/Plan: 1. Pain due to onychomycosis of toenails of both feet   2. Callus   3. Prediabetes     No orders of the defined types were placed in this encounter.   -Consent given for treatment as described below: -Examined patient. -Mycotic toenails 1-5 bilaterally were debrided in length and girth with sterile nail nippers and dremel without incident. -Callus(es) medial IPJ of left great toe pared utilizing sterile scalpel blade without complication or incident. Total number debrided  =1. -Patient/POA to call should there be question/concern in the interim.   Return in about 3 months (around 05/13/2022).  Marzetta Board, DPM

## 2022-05-24 ENCOUNTER — Ambulatory Visit: Payer: Self-pay | Admitting: Student

## 2022-05-24 DIAGNOSIS — E119 Type 2 diabetes mellitus without complications: Secondary | ICD-10-CM

## 2022-05-24 NOTE — H&P (View-Only) (Signed)
TOTAL KNEE ADMISSION H&P  Patient is being admitted for left total knee arthroplasty.  Subjective:  Chief Complaint:left knee pain.  HPI: Kimberly Sellers, 77 y.o. female, has a history of pain and functional disability in the left knee due to arthritis and has failed non-surgical conservative treatments for greater than 12 weeks to includeNSAID's and/or analgesics, corticosteriod injections, flexibility and strengthening excercises, use of assistive devices, and activity modification.  Onset of symptoms was gradual, starting 10 years ago with rapidlly worsening course since that time. The patient noted no past surgery on the left knee(s).  Patient currently rates pain in the left knee(s) at 10 out of 10 with activity. Patient has night pain, worsening of pain with activity and weight bearing, pain that interferes with activities of daily living, pain with passive range of motion, crepitus, and joint swelling.  Patient has evidence of subchondral cysts, subchondral sclerosis, periarticular osteophytes, and joint space narrowing by imaging studies. There is no active infection.  Patient Active Problem List   Diagnosis Date Noted   Osteoarthritis of left knee 09/09/2021   Pain of left hip joint 08/19/2021   Pain in joint of left knee 04/02/2021   Supraventricular tachycardia 06/10/2012   Hypokalemia 06/09/2012   Partial small bowel obstruction (HCC) 06/07/2012   Sepsis (HCC) 06/03/2012   Lactic acidosis 06/02/2012   Abdominal pain 06/01/2012   Nausea & vomiting 06/01/2012   Body aches 06/01/2012   Flu-like symptoms 06/01/2012   Ileus (HCC) 06/01/2012   HTN (hypertension) 06/01/2012   Adrenal nodule (HCC) 06/01/2012   Past Medical History:  Diagnosis Date   Anxiety    Arthritis    Diabetes mellitus without complication (HCC)    Dyspnea    with exertion   GERD (gastroesophageal reflux disease)    Hypertension    Nausea & vomiting 06/01/2012    Past Surgical History:  Procedure  Laterality Date   ABDOMINAL HYSTERECTOMY     CHOLECYSTECTOMY      Current Outpatient Medications  Medication Sig Dispense Refill Last Dose   acetaminophen (TYLENOL) 500 MG tablet Take 1,000 mg by mouth every 6 (six) hours as needed for moderate pain.      amLODipine (NORVASC) 10 MG tablet Take 10 mg by mouth daily.      aspirin EC 81 MG tablet Take 81 mg by mouth daily.      Cholecalciferol (VITAMIN D3) 2000 units capsule Take 2,000 Units by mouth daily.      cycloSPORINE (RESTASIS) 0.05 % ophthalmic emulsion Place 1 drop into both eyes 2 (two) times daily as needed (dry eyes).      lisinopril-hydrochlorothiazide (PRINZIDE,ZESTORETIC) 20-25 MG tablet Take 1 tablet by mouth daily.      loratadine (CLARITIN) 10 MG tablet Take 10 mg by mouth daily as needed for allergies.      meloxicam (MOBIC) 7.5 MG tablet Take 7.5 mg by mouth daily as needed for pain.      Menthol, Topical Analgesic, (ICY HOT EX) Apply 1 Application topically daily as needed (pain).      metFORMIN (GLUCOPHAGE) 500 MG tablet Take 500 mg by mouth daily.      Multiple Vitamin (MULTIVITAMIN PO) Take 1 tablet by mouth daily.      pantoprazole (PROTONIX) 40 MG tablet Take 40 mg by mouth daily.      No current facility-administered medications for this visit.   No Known Allergies  Social History   Tobacco Use   Smoking status: Former    Types:  Cigarettes   Smokeless tobacco: Never  Substance Use Topics   Alcohol use: No    Family History  Problem Relation Age of Onset   Breast cancer Cousin        87s     Review of Systems  Musculoskeletal:  Positive for arthralgias, gait problem and joint swelling.  All other systems reviewed and are negative.   Objective:  Physical Exam Constitutional:      Appearance: Normal appearance.  HENT:     Head: Normocephalic and atraumatic.     Nose: Nose normal.     Mouth/Throat:     Mouth: Mucous membranes are moist.     Pharynx: Oropharynx is clear.  Eyes:      Conjunctiva/sclera: Conjunctivae normal.  Cardiovascular:     Rate and Rhythm: Normal rate and regular rhythm.     Pulses: Normal pulses.     Heart sounds: Normal heart sounds.  Pulmonary:     Effort: Pulmonary effort is normal.     Breath sounds: Normal breath sounds.  Abdominal:     General: Abdomen is flat.     Palpations: Abdomen is soft.  Genitourinary:    Comments: deferred Musculoskeletal:     Cervical back: Normal range of motion and neck supple.     Comments: Examination of the left knee reveals no skin wounds or lesions. Trace effusion. No warmth or erythema. Valgus alignment. Tenderness to palpation medial joint line, lateral joint line, and peripatellar retinacular tissues with a positive grind side. Her range of motion is 20 to 100 degrees without any ligamentous instability. Painless range of motion of the hip.  Neurovascular intact distally.  Ambulates with a markedly antalgic gait.  Skin:    General: Skin is warm and dry.     Capillary Refill: Capillary refill takes less than 2 seconds.  Neurological:     General: No focal deficit present.     Mental Status: She is alert and oriented to person, place, and time.  Psychiatric:        Mood and Affect: Mood normal.        Behavior: Behavior normal.        Thought Content: Thought content normal.        Judgment: Judgment normal.     Vital signs in last 24 hours: @VSRANGES @  Labs:   Estimated body mass index is 28.19 kg/m as calculated from the following:   Height as of 05/31/22: 5\' 7"  (1.702 m).   Weight as of 05/31/22: 81.6 kg.   Imaging Review Plain radiographs demonstrate severe degenerative joint disease of the left knee(s). The overall alignment ismild valgus. The bone quality appears to be adequate for age and reported activity level.      Assessment/Plan:  End stage arthritis, left knee   The patient history, physical examination, clinical judgment of the provider and imaging studies are  consistent with end stage degenerative joint disease of the left knee(s) and total knee arthroplasty is deemed medically necessary. The treatment options including medical management, injection therapy arthroscopy and arthroplasty were discussed at length. The risks and benefits of total knee arthroplasty were presented and reviewed. The risks due to aseptic loosening, infection, stiffness, patella tracking problems, thromboembolic complications and other imponderables were discussed. The patient acknowledged the explanation, agreed to proceed with the plan and consent was signed. Patient is being admitted for inpatient treatment for surgery, pain control, PT, OT, prophylactic antibiotics, VTE prophylaxis, progressive ambulation and ADL's and discharge planning. The patient  is planning to be discharged home with OPPT.   Therapy Plans: outpatient therapy. EO 1st appointment 06/13/22.  Disposition: Home with daughters Planned DVT Prophylaxis: aspirin 81mg  BID DME needed: walker. Iceman ice machine today.  PCP: Cleared Cardiology: Cleared TXA: IV Allergies: NDKA.  Anesthesia Concerns: None.  BMI: 30.8 Last HgbA1c: 5.2.  Other: - T2DM - Aspirin 81mg  baseline.  - Oxycodone, zofran, meloxicam.  - 05/31/22: Cr. 0.54, Hgb 12.7.     Patient's anticipated LOS is less than 2 midnights, meeting these requirements: - Younger than 39 - Lives within 1 hour of care - Has a competent adult at home to recover with post-op recover - NO history of  - Chronic pain requiring opiods  - Diabetes  - Coronary Artery Disease  - Heart failure  - Heart attack  - Stroke  - DVT/VTE  - Cardiac arrhythmia  - Respiratory Failure/COPD  - Renal failure  - Anemia  - Advanced Liver disease

## 2022-05-24 NOTE — H&P (Signed)
TOTAL KNEE ADMISSION H&P  Patient is being admitted for left total knee arthroplasty.  Subjective:  Chief Complaint:left knee pain.  HPI: Kimberly Sellers, 77 y.o. female, has a history of pain and functional disability in the left knee due to arthritis and has failed non-surgical conservative treatments for greater than 12 weeks to includeNSAID's and/or analgesics, corticosteriod injections, flexibility and strengthening excercises, use of assistive devices, and activity modification.  Onset of symptoms was gradual, starting 10 years ago with rapidlly worsening course since that time. The patient noted no past surgery on the left knee(s).  Patient currently rates pain in the left knee(s) at 10 out of 10 with activity. Patient has night pain, worsening of pain with activity and weight bearing, pain that interferes with activities of daily living, pain with passive range of motion, crepitus, and joint swelling.  Patient has evidence of subchondral cysts, subchondral sclerosis, periarticular osteophytes, and joint space narrowing by imaging studies. There is no active infection.  Patient Active Problem List   Diagnosis Date Noted   Osteoarthritis of left knee 09/09/2021   Pain of left hip joint 08/19/2021   Pain in joint of left knee 04/02/2021   Supraventricular tachycardia 06/10/2012   Hypokalemia 06/09/2012   Partial small bowel obstruction (HCC) 06/07/2012   Sepsis (HCC) 06/03/2012   Lactic acidosis 06/02/2012   Abdominal pain 06/01/2012   Nausea & vomiting 06/01/2012   Body aches 06/01/2012   Flu-like symptoms 06/01/2012   Ileus (HCC) 06/01/2012   HTN (hypertension) 06/01/2012   Adrenal nodule (HCC) 06/01/2012   Past Medical History:  Diagnosis Date   Anxiety    Arthritis    Diabetes mellitus without complication (HCC)    Dyspnea    with exertion   GERD (gastroesophageal reflux disease)    Hypertension    Nausea & vomiting 06/01/2012    Past Surgical History:  Procedure  Laterality Date   ABDOMINAL HYSTERECTOMY     CHOLECYSTECTOMY      Current Outpatient Medications  Medication Sig Dispense Refill Last Dose   acetaminophen (TYLENOL) 500 MG tablet Take 1,000 mg by mouth every 6 (six) hours as needed for moderate pain.      amLODipine (NORVASC) 10 MG tablet Take 10 mg by mouth daily.      aspirin EC 81 MG tablet Take 81 mg by mouth daily.      Cholecalciferol (VITAMIN D3) 2000 units capsule Take 2,000 Units by mouth daily.      cycloSPORINE (RESTASIS) 0.05 % ophthalmic emulsion Place 1 drop into both eyes 2 (two) times daily as needed (dry eyes).      lisinopril-hydrochlorothiazide (PRINZIDE,ZESTORETIC) 20-25 MG tablet Take 1 tablet by mouth daily.      loratadine (CLARITIN) 10 MG tablet Take 10 mg by mouth daily as needed for allergies.      meloxicam (MOBIC) 7.5 MG tablet Take 7.5 mg by mouth daily as needed for pain.      Menthol, Topical Analgesic, (ICY HOT EX) Apply 1 Application topically daily as needed (pain).      metFORMIN (GLUCOPHAGE) 500 MG tablet Take 500 mg by mouth daily.      Multiple Vitamin (MULTIVITAMIN PO) Take 1 tablet by mouth daily.      pantoprazole (PROTONIX) 40 MG tablet Take 40 mg by mouth daily.      No current facility-administered medications for this visit.   No Known Allergies  Social History   Tobacco Use   Smoking status: Former    Types:  Cigarettes   Smokeless tobacco: Never  Substance Use Topics   Alcohol use: No    Family History  Problem Relation Age of Onset   Breast cancer Cousin        87s     Review of Systems  Musculoskeletal:  Positive for arthralgias, gait problem and joint swelling.  All other systems reviewed and are negative.   Objective:  Physical Exam Constitutional:      Appearance: Normal appearance.  HENT:     Head: Normocephalic and atraumatic.     Nose: Nose normal.     Mouth/Throat:     Mouth: Mucous membranes are moist.     Pharynx: Oropharynx is clear.  Eyes:      Conjunctiva/sclera: Conjunctivae normal.  Cardiovascular:     Rate and Rhythm: Normal rate and regular rhythm.     Pulses: Normal pulses.     Heart sounds: Normal heart sounds.  Pulmonary:     Effort: Pulmonary effort is normal.     Breath sounds: Normal breath sounds.  Abdominal:     General: Abdomen is flat.     Palpations: Abdomen is soft.  Genitourinary:    Comments: deferred Musculoskeletal:     Cervical back: Normal range of motion and neck supple.     Comments: Examination of the left knee reveals no skin wounds or lesions. Trace effusion. No warmth or erythema. Valgus alignment. Tenderness to palpation medial joint line, lateral joint line, and peripatellar retinacular tissues with a positive grind side. Her range of motion is 20 to 100 degrees without any ligamentous instability. Painless range of motion of the hip.  Neurovascular intact distally.  Ambulates with a markedly antalgic gait.  Skin:    General: Skin is warm and dry.     Capillary Refill: Capillary refill takes less than 2 seconds.  Neurological:     General: No focal deficit present.     Mental Status: She is alert and oriented to person, place, and time.  Psychiatric:        Mood and Affect: Mood normal.        Behavior: Behavior normal.        Thought Content: Thought content normal.        Judgment: Judgment normal.     Vital signs in last 24 hours: @VSRANGES @  Labs:   Estimated body mass index is 28.19 kg/m as calculated from the following:   Height as of 05/31/22: 5\' 7"  (1.702 m).   Weight as of 05/31/22: 81.6 kg.   Imaging Review Plain radiographs demonstrate severe degenerative joint disease of the left knee(s). The overall alignment ismild valgus. The bone quality appears to be adequate for age and reported activity level.      Assessment/Plan:  End stage arthritis, left knee   The patient history, physical examination, clinical judgment of the provider and imaging studies are  consistent with end stage degenerative joint disease of the left knee(s) and total knee arthroplasty is deemed medically necessary. The treatment options including medical management, injection therapy arthroscopy and arthroplasty were discussed at length. The risks and benefits of total knee arthroplasty were presented and reviewed. The risks due to aseptic loosening, infection, stiffness, patella tracking problems, thromboembolic complications and other imponderables were discussed. The patient acknowledged the explanation, agreed to proceed with the plan and consent was signed. Patient is being admitted for inpatient treatment for surgery, pain control, PT, OT, prophylactic antibiotics, VTE prophylaxis, progressive ambulation and ADL's and discharge planning. The patient  is planning to be discharged home with OPPT.   Therapy Plans: outpatient therapy. EO 1st appointment 06/13/22.  Disposition: Home with daughters Planned DVT Prophylaxis: aspirin 81mg  BID DME needed: walker. Iceman ice machine today.  PCP: Cleared Cardiology: Cleared TXA: IV Allergies: NDKA.  Anesthesia Concerns: None.  BMI: 30.8 Last HgbA1c: 5.2.  Other: - T2DM - Aspirin 81mg  baseline.  - Oxycodone, zofran, meloxicam.  - 05/31/22: Cr. 0.54, Hgb 12.7.     Patient's anticipated LOS is less than 2 midnights, meeting these requirements: - Younger than 41 - Lives within 1 hour of care - Has a competent adult at home to recover with post-op recover - NO history of  - Chronic pain requiring opiods  - Diabetes  - Coronary Artery Disease  - Heart failure  - Heart attack  - Stroke  - DVT/VTE  - Cardiac arrhythmia  - Respiratory Failure/COPD  - Renal failure  - Anemia  - Advanced Liver disease

## 2022-05-24 NOTE — Progress Notes (Signed)
Sent message, via epic in basket, requesting order in epic from surgeon    05/24/22 1342  Preop Orders  Has preop orders? No  Name of staff/physician contacted for orders(Indicate phone or IB message) Hill, A. PA-C.

## 2022-05-25 ENCOUNTER — Ambulatory Visit: Payer: Medicare Other | Admitting: Podiatry

## 2022-05-25 ENCOUNTER — Ambulatory Visit (INDEPENDENT_AMBULATORY_CARE_PROVIDER_SITE_OTHER): Payer: Medicare Other | Admitting: Podiatry

## 2022-05-25 VITALS — BP 151/72

## 2022-05-25 DIAGNOSIS — L84 Corns and callosities: Secondary | ICD-10-CM | POA: Diagnosis not present

## 2022-05-25 DIAGNOSIS — R7303 Prediabetes: Secondary | ICD-10-CM

## 2022-05-25 DIAGNOSIS — Q828 Other specified congenital malformations of skin: Secondary | ICD-10-CM

## 2022-05-25 DIAGNOSIS — B351 Tinea unguium: Secondary | ICD-10-CM

## 2022-05-25 DIAGNOSIS — M79674 Pain in right toe(s): Secondary | ICD-10-CM | POA: Diagnosis not present

## 2022-05-25 DIAGNOSIS — M79675 Pain in left toe(s): Secondary | ICD-10-CM

## 2022-05-25 NOTE — Progress Notes (Signed)
  Subjective:  Patient ID: Kimberly Sellers, female    DOB: 07/16/1945,  MRN: [id number]  Kimberly Sellers presents to clinic today for callus(es) b/l lower extremities and painful thick toenails that are difficult to trim. Painful toenails interfere with ambulation. Aggravating factors include wearing enclosed shoe gear. Pain is relieved with periodic professional debridement. Painful calluses are aggravated when weightbearing with and without shoegear. Pain is relieved with periodic professional debridement.   She states her knee surgery was postponed and she is hoping to have it done in the near future. Chief Complaint  Patient presents with   Nail Problem    [facility] BS-99 A1C-5.3 PCP-[place], Shanna PCP VST-03/03/2022    New problem(s): None.   PCP is Cipriano Mile, NP.  No Known Allergies  Review of Systems: Negative except as noted in the HPI.  Objective: No changes noted in today's physical examination. Vitals:   05/25/22 1436 05/25/22 1450  BP: (!) 167/86 (!) 151/72   Kimberly Sellers is a pleasant 77 y.o. female obese in NAD. AAO x 3. Vascular CFT immediate b/l LE. Palpable DP/PT pulses b/l LE. Digital hair sparse b/l. Skin temperature gradient WNL b/l. No pain with calf compression b/l. No edema noted b/l. No cyanosis or clubbing noted b/l LE.  Neurologic Normal speech. Oriented to person, place, and time. Protective sensation intact 5/5 intact bilaterally with 10g monofilament b/l.  Dermatologic Pedal integument with normal turgor, texture and tone b/l LE. No open wounds b/l. No interdigital macerations b/l. Toenails 1-5 b/l elongated, thickened, discolored with subungual debris. +Tenderness with dorsal palpation of nailplates.   Hyperkeratotic lesion medial IPJ of  b/l halluces. No erythema, no edema, no drainage, no fluctuance.  Orthopedic: Normal muscle strength 5/5 to all lower extremity muscle groups bilaterally. Pes planus deformity noted bilateral LE. No pain, crepitus  or joint limitation noted with ROM b/l LE.  Patient ambulates with cane assistance.   Radiographs: None  Assessment/Plan: 1. Pain due to onychomycosis of toenails of both feet   2. Callus   3. Prediabetes     No orders of the defined types were placed in this encounter.  -Patient was evaluated and treated. All patient's and/or POA's questions/concerns answered on today's visit. -Patient to continue soft, supportive shoe gear daily. -Toenails 1-5 b/l were debrided in length and girth with sterile nail nippers and dremel without iatrogenic bleeding.  -Callus(es) bilateral great toes pared utilizing sterile scalpel blade without complication or incident. Total number debrided =2. -Patient/POA to call should there be question/concern in the interim.   No follow-ups on file.  Marzetta Board, DPM

## 2022-05-27 ENCOUNTER — Encounter (HOSPITAL_COMMUNITY): Payer: Self-pay

## 2022-05-27 NOTE — Patient Instructions (Signed)
SURGICAL WAITING ROOM VISITATION  Patients having surgery or a procedure may have no more than 2 support people in the waiting area - these visitors may rotate.    Children under the age of 67 must have an adult with them who is not the patient.  Due to an increase in RSV and influenza rates and associated hospitalizations, children ages 62 and under may not visit patients in [place].  If the patient needs to stay at the hospital during part of their recovery, the visitor guidelines for inpatient rooms apply. Pre-op nurse will coordinate an appropriate time for 1 support person to accompany patient in pre-op.  This support person may not rotate.    Please refer to the [facility] website for the visitor guidelines for Inpatients (after your surgery is over and you are in a regular room).       Your procedure is scheduled on:  06/08/22    Report to [facility] Main Entrance    Report to admitting at   0810AM   Call this number if you have problems the morning of surgery [phone]   Do not eat food :After Midnight.   After Midnight you may have the following liquids until ___ 0730___ AM DAY OF SURGERY  Water Non-Citrus Juices (without pulp, NO RED-Apple, White grape, White cranberry) Black Coffee (NO MILK/CREAM OR CREAMERS, sugar ok)  Clear Tea (NO MILK/CREAM OR CREAMERS, sugar ok) regular and decaf                             Plain Jell-O (NO RED)                                           Fruit ices (not with fruit pulp, NO RED)                                     Popsicles (NO RED)                                                               Sports drinks like Gatorade (NO RED)                    The day of surgery:  Drink ONE (1) Pre-Surgery Clear Ensure or G2 at  0730 AM  ( have completed by ) the morning of surgery. Drink in one sitting. Do not sip.  This drink was given to you during your hospital  pre-op appointment visit. Nothing else to  drink after completing the  Pre-Surgery Clear Ensure or G2.          If you have questions, please contact your surgeon's office.        Oral Hygiene is also important to reduce your risk of infection.                                    Remember - BRUSH YOUR TEETH THE MORNING OF SURGERY  WITH YOUR REGULAR TOOTHPASTE  DENTURES WILL BE REMOVED PRIOR TO SURGERY PLEASE DO NOT APPLY "Poly grip" OR ADHESIVES!!!   Do NOT smoke after Midnight   Take these medicines the morning of surgery with A SIP OF WATER:  amlodpine, flonase if needed, claritin, pantoprazole   DO NOT TAKE ANY ORAL DIABETIC MEDICATIONS DAY OF YOUR SURGERY  Bring CPAP mask and tubing day of surgery.                              You may not have any metal on your body including hair pins, jewelry, and body piercing             Do not wear make-up, lotions, powders, perfumes/cologne, or deodorant  Do not wear nail polish including gel and S&S, artificial/acrylic nails, or any other type of covering on natural nails including finger and toenails. If you have artificial nails, gel coating, etc. that needs to be removed by a nail salon please have this removed prior to surgery or surgery may need to be canceled/ delayed if the surgeon/ anesthesia feels like they are unable to be safely monitored.   Do not shave  48 hours prior to surgery.               Men may shave face and neck.   Do not bring valuables to the hospital. [place].   Contacts, glasses, dentures or bridgework may not be worn into surgery.   Bring small overnight bag day of surgery.   DO NOT [place]. PHARMACY WILL DISPENSE MEDICATIONS LISTED ON YOUR MEDICATION LIST TO YOU DURING YOUR ADMISSION [place]!    Patients discharged on the day of surgery will not be allowed to drive home.  Someone NEEDS to stay with you for the first 24 hours after anesthesia.   Special  Instructions: Bring a copy of your healthcare power of attorney and living will documents the day of surgery if you haven't scanned them before.              Please read over the following fact sheets you were given: IF [employer] [id number]   If you received a COVID test during your pre-op visit  it is requested that you wear a mask when out in public, stay away from anyone that may not be feeling well and notify your surgeon if you develop symptoms. If you test positive for Covid or have been in contact with anyone that has tested positive in the last 10 days please notify you surgeon.    [place] - Preparing for Surgery Before surgery, you can play an important role.  Because skin is not sterile, your skin needs to be as free of germs as possible.  You can reduce the number of germs on your skin by washing with CHG (chlorahexidine gluconate) soap before surgery.  CHG is an antiseptic cleaner which kills germs and bonds with the skin to continue killing germs even after washing. Please DO NOT use if you have an allergy to CHG or antibacterial soaps.  If your skin becomes reddened/irritated stop using the CHG and inform your nurse when you arrive at Short Stay. Do not shave (including legs and underarms) for at least 48 hours  prior to the first CHG shower.  You may shave your face/neck. Please follow these instructions carefully:  1.  Shower with CHG Soap the night before surgery and the  morning of Surgery.  2.  If you choose to wash your hair, wash your hair first as usual with your  normal  shampoo.  3.  After you shampoo, rinse your hair and body thoroughly to remove the  shampoo.                           4.  Use CHG as you would any other liquid soap.  You can apply chg directly  to the skin and wash                       Gently with a scrungie or clean washcloth.  5.  Apply the CHG Soap to your body ONLY FROM THE NECK DOWN.   Do not use  on face/ open                           Wound or open sores. Avoid contact with eyes, ears mouth and genitals (private parts).                       Wash face,  Genitals (private parts) with your normal soap.             6.  Wash thoroughly, paying special attention to the area where your surgery  will be performed.  7.  Thoroughly rinse your body with warm water from the neck down.  8.  DO NOT shower/wash with your normal soap after using and rinsing off  the CHG Soap.                9.  Pat yourself dry with a clean towel.            10.  Wear clean pajamas.            11.  Place clean sheets on your bed the night of your first shower and do not  sleep with pets. Day of Surgery : Do not apply any lotions/deodorants the morning of surgery.  Please wear clean clothes to the hospital/surgery center.  FAILURE TO FOLLOW THESE INSTRUCTIONS MAY RESULT IN THE CANCELLATION OF YOUR SURGERY PATIENT SIGNATURE_________________________________  NURSE SIGNATURE__________________________________  ________________________________________________________________________

## 2022-05-27 NOTE — Progress Notes (Addendum)
Anesthesia Review:  PCP: Claiborne Billings, PAC Clearance dated 03/15/22 on chart  Cardiologist : DR Eldridge Dace LOV 12/16/21   Casitlin Walker,NP clearance dated 02/08/22 on chart  Chest x-ray : EKG : 12/16/21  Echo : 10/13/20  Stress test: 12/21/21  Cardiac Cath :  Activity level:  can do a flgiht of stairs wtihout difficutly  Sleep Study/ CPAP : Fasting Blood Sugar :      / Checks Blood Sugar -- times a day:   Blood Thinner/ Instructions /Last Dose: ASA / Instructions/ Last Dose :   81 mg aspirin   DM- type 2- checks glucose twice daily at home  Hgba1c-  05/31/22-  5.2

## 2022-05-31 ENCOUNTER — Encounter (HOSPITAL_COMMUNITY)
Admission: RE | Admit: 2022-05-31 | Discharge: 2022-05-31 | Disposition: A | Discharge status: Home / Self Care | Payer: 59 | Source: Ambulatory Visit | Attending: Orthopedic Surgery | Admitting: Orthopedic Surgery

## 2022-05-31 ENCOUNTER — Encounter (HOSPITAL_COMMUNITY): Payer: Self-pay

## 2022-05-31 ENCOUNTER — Other Ambulatory Visit: Payer: Self-pay

## 2022-05-31 VITALS — BP 151/78 | HR 78 | Temp 98.3°F | Resp 16 | Ht 67.0 in | Wt 180.0 lb

## 2022-05-31 DIAGNOSIS — E119 Type 2 diabetes mellitus without complications: Secondary | ICD-10-CM | POA: Diagnosis not present

## 2022-05-31 DIAGNOSIS — Z794 Long term (current) use of insulin: Secondary | ICD-10-CM | POA: Diagnosis not present

## 2022-05-31 DIAGNOSIS — K219 Gastro-esophageal reflux disease without esophagitis: Secondary | ICD-10-CM | POA: Diagnosis not present

## 2022-05-31 DIAGNOSIS — Z01812 Encounter for preprocedural laboratory examination: Secondary | ICD-10-CM | POA: Insufficient documentation

## 2022-05-31 DIAGNOSIS — M1712 Unilateral primary osteoarthritis, left knee: Secondary | ICD-10-CM | POA: Insufficient documentation

## 2022-05-31 DIAGNOSIS — I10 Essential (primary) hypertension: Secondary | ICD-10-CM | POA: Insufficient documentation

## 2022-05-31 DIAGNOSIS — Z7982 Long term (current) use of aspirin: Secondary | ICD-10-CM | POA: Diagnosis not present

## 2022-05-31 DIAGNOSIS — Z87891 Personal history of nicotine dependence: Secondary | ICD-10-CM | POA: Diagnosis not present

## 2022-05-31 DIAGNOSIS — Z01818 Encounter for other preprocedural examination: Secondary | ICD-10-CM

## 2022-05-31 DIAGNOSIS — Z9049 Acquired absence of other specified parts of digestive tract: Secondary | ICD-10-CM | POA: Insufficient documentation

## 2022-05-31 DIAGNOSIS — Z79899 Other long term (current) drug therapy: Secondary | ICD-10-CM | POA: Diagnosis not present

## 2022-05-31 HISTORY — DX: Gastro-esophageal reflux disease without esophagitis: K21.9

## 2022-05-31 HISTORY — DX: Dyspnea, unspecified: R06.00

## 2022-05-31 HISTORY — DX: Type 2 diabetes mellitus without complications: E11.9

## 2022-05-31 HISTORY — DX: Unspecified osteoarthritis, unspecified site: M19.90

## 2022-05-31 LAB — CBC
HCT: 40.2 % (ref 36.0–46.0)
Hemoglobin: 12.7 g/dL (ref 12.0–15.0)
MCH: 25.9 pg — ABNORMAL LOW (ref 26.0–34.0)
MCHC: 31.6 g/dL (ref 30.0–36.0)
MCV: 82 fL (ref 80.0–100.0)
Platelets: 351 10*3/uL (ref 150–400)
RBC: 4.9 MIL/uL (ref 3.87–5.11)
RDW: 16.4 % — ABNORMAL HIGH (ref 11.5–15.5)
WBC: 8.2 10*3/uL (ref 4.0–10.5)
nRBC: 0 % (ref 0.0–0.2)

## 2022-05-31 LAB — BASIC METABOLIC PANEL
Anion gap: 9 (ref 5–15)
BUN: 18 mg/dL (ref 8–23)
CO2: 27 mmol/L (ref 22–32)
Calcium: 9.7 mg/dL (ref 8.9–10.3)
Chloride: 104 mmol/L (ref 98–111)
Creatinine, Ser: 0.54 mg/dL (ref 0.44–1.00)
GFR, Estimated: >60 mL/min (ref >60)
Glucose, Bld: 88 mg/dL (ref 70–99)
Potassium: 4.4 mmol/L (ref 3.5–5.1)
Sodium: 140 mmol/L (ref 135–145)

## 2022-05-31 LAB — GLUCOSE, CAPILLARY: Glucose-Capillary: 91 mg/dL (ref 70–99)

## 2022-05-31 LAB — SURGICAL PCR SCREEN
MRSA, PCR: NEGATIVE
Staphylococcus aureus: NEGATIVE

## 2022-05-31 LAB — HEMOGLOBIN A1C
Hgb A1c MFr Bld: 5.2 % (ref 4.8–5.6)
Mean Plasma Glucose: 102.54 mg/dL

## 2022-06-01 NOTE — Progress Notes (Signed)
Anesthesia Chart Review   Case: [id number] Date/Time: 06/08/22 1025   Procedure: COMPUTER ASSISTED TOTAL KNEE ARTHROPLASTY (Left: Knee) - 160   Anesthesia type: Spinal   Pre-op diagnosis: Left knee osteoarthritis   Location: [facility] ROOM 07 / [facility]   Surgeons: Rod Can, MD       DISCUSSION:77 y.o. former smoker with h/o HTN, DM II, left knee OA scheduled for above procedure 06/08/22 with Dr. Rod Can.   Per cardiology preoperative evaluation 02/08/2022, "Chart reviewed as part of pre-operative protocol coverage. Given past medical history and time since last visit, based on ACC/AHA guidelines, ALAYSSA FLINCHUM would be at acceptable risk for the planned procedure without further cardiovascular testing.    Seen 01/12/22 by Dr. Irish Lack: "Does report some precordial chest pain.  Somewhat atypical but given that she cannot reach 4 METS of exercise due to her knee pain, will plan for pharmacologic nuclear stress test.  Last stress test was in 2003 and was normal.  Normal EF.  Only high risk stress test finding would postpone her surgery."   Subsequent myoview 12/21/21 low risk study with no indication for additional cardiovascular testing prior to surgery. "  Anticipate pt can proceed with planned procedure barring acute status change.   VS: BP (!) 151/78   Pulse 78   Temp 36.8 C (Oral)   Resp 16   Ht 5\' 7"  (1.702 m)   Wt 81.6 kg   SpO2 97%   BMI 28.19 kg/m   PROVIDERS: Cipriano Mile, NP is PCP    Primary Cardiologist: Larae Grooms, MD  LABS: Labs reviewed: Acceptable for surgery. (all labs ordered are listed, but only abnormal results are displayed)  Labs Reviewed  CBC - Abnormal; Notable for the following components:      Result Value   MCH 25.9 (*)    RDW 16.4 (*)    All other components within normal limits  SURGICAL PCR SCREEN  BASIC METABOLIC PANEL  HEMOGLOBIN A1C  GLUCOSE, CAPILLARY     IMAGES:   EKG:   CV: Myocardial Perfusion 12/21/2021   The  study is normal. The study is low risk.   No ST deviation was noted with stress. Sinus bradycardia at rest with occasional PACs and PVCs.   LV perfusion is normal. There is no evidence of ischemia. There is no evidence of infarction.   Left ventricular function is normal. Nuclear stress EF: 57 %. The left ventricular ejection fraction is normal (55-65%). End diastolic cavity size is normal. End systolic cavity size is normal.   Prior study available for comparison from 09/13/2001 - report only. No changes compared to prior study.  Echo 10/13/2020 1. Left ventricular ejection fraction, by estimation, is 60 to 65%. Left  ventricular ejection fraction by PLAX is 61 %. The left ventricle has  normal function. The left ventricle has no regional wall motion  abnormalities. Left ventricular diastolic  parameters are consistent with Grade I diastolic dysfunction (impaired  relaxation). The average left ventricular global longitudinal strain is  -22.4 %. The global longitudinal strain is normal.   2. Right ventricular systolic function is normal. The right ventricular  size is normal. There is normal pulmonary artery systolic pressure. The  estimated right ventricular systolic pressure is 42.5 mmHg.   3. The mitral valve is abnormal. Trivial mitral valve regurgitation.   4. The aortic valve is tricuspid. Aortic valve regurgitation is trivial.  Mild to moderate aortic valve sclerosis/calcification is present, without  any evidence of aortic  stenosis. Aortic regurgitation PHT measures 706  msec. Aortic valve mean gradient  measures 8.0 mmHg. DI is 0.51.  Past Medical History:  Diagnosis Date   Anxiety    Arthritis    Diabetes mellitus without complication ([place])    Dyspnea    with exertion   GERD (gastroesophageal reflux disease)    Hypertension    Nausea & vomiting 06/01/2012    Past Surgical History:  Procedure Laterality Date   ABDOMINAL HYSTERECTOMY     CHOLECYSTECTOMY       MEDICATIONS:  acetaminophen (TYLENOL) 500 MG tablet   amLODipine (NORVASC) 10 MG tablet   aspirin EC 81 MG tablet   Cholecalciferol (VITAMIN D3) 2000 units capsule   cycloSPORINE (RESTASIS) 0.05 % ophthalmic emulsion   lisinopril-hydrochlorothiazide (PRINZIDE,ZESTORETIC) 20-25 MG tablet   loratadine (CLARITIN) 10 MG tablet   meloxicam (MOBIC) 7.5 MG tablet   Menthol, Topical Analgesic, (ICY HOT EX)   metFORMIN (GLUCOPHAGE) 500 MG tablet   Multiple Vitamin (MULTIVITAMIN PO)   pantoprazole (PROTONIX) 40 MG tablet   No current facility-administered medications for this encounter.    Konrad Felix Ward, PA-C [facility] Pre-Surgical Testing [phone]

## 2022-06-08 ENCOUNTER — Encounter (HOSPITAL_COMMUNITY)
Admission: RE | Disposition: A | Discharge status: Home / Self Care | Payer: Self-pay | Source: Home / Self Care | Attending: Orthopedic Surgery

## 2022-06-08 ENCOUNTER — Ambulatory Visit (HOSPITAL_BASED_OUTPATIENT_CLINIC_OR_DEPARTMENT_OTHER): Payer: 59 | Admitting: Certified Registered"

## 2022-06-08 ENCOUNTER — Ambulatory Visit (HOSPITAL_COMMUNITY): Payer: 59 | Admitting: Physician Assistant

## 2022-06-08 ENCOUNTER — Other Ambulatory Visit: Payer: Self-pay

## 2022-06-08 ENCOUNTER — Inpatient Hospital Stay (HOSPITAL_COMMUNITY)
Admission: RE | Admit: 2022-06-08 | Discharge: 2022-06-17 | DRG: 470 | Disposition: A | Discharge status: Home / Self Care | Payer: 59 | Attending: Orthopedic Surgery | Admitting: Orthopedic Surgery

## 2022-06-08 ENCOUNTER — Ambulatory Visit (HOSPITAL_COMMUNITY): Payer: 59

## 2022-06-08 ENCOUNTER — Encounter (HOSPITAL_COMMUNITY): Payer: Self-pay | Admitting: Orthopedic Surgery

## 2022-06-08 DIAGNOSIS — Z01818 Encounter for other preprocedural examination: Secondary | ICD-10-CM

## 2022-06-08 DIAGNOSIS — R001 Bradycardia, unspecified: Secondary | ICD-10-CM | POA: Diagnosis present

## 2022-06-08 DIAGNOSIS — Z7984 Long term (current) use of oral hypoglycemic drugs: Secondary | ICD-10-CM

## 2022-06-08 DIAGNOSIS — I10 Essential (primary) hypertension: Secondary | ICD-10-CM | POA: Diagnosis present

## 2022-06-08 DIAGNOSIS — R519 Headache, unspecified: Secondary | ICD-10-CM | POA: Diagnosis not present

## 2022-06-08 DIAGNOSIS — R42 Dizziness and giddiness: Secondary | ICD-10-CM | POA: Diagnosis not present

## 2022-06-08 DIAGNOSIS — M1712 Unilateral primary osteoarthritis, left knee: Secondary | ICD-10-CM

## 2022-06-08 DIAGNOSIS — H9319 Tinnitus, unspecified ear: Secondary | ICD-10-CM | POA: Diagnosis present

## 2022-06-08 DIAGNOSIS — Z96652 Presence of left artificial knee joint: Secondary | ICD-10-CM

## 2022-06-08 DIAGNOSIS — E869 Volume depletion, unspecified: Secondary | ICD-10-CM | POA: Diagnosis not present

## 2022-06-08 DIAGNOSIS — Z803 Family history of malignant neoplasm of breast: Secondary | ICD-10-CM

## 2022-06-08 DIAGNOSIS — Z90710 Acquired absence of both cervix and uterus: Secondary | ICD-10-CM

## 2022-06-08 DIAGNOSIS — Z9049 Acquired absence of other specified parts of digestive tract: Secondary | ICD-10-CM

## 2022-06-08 DIAGNOSIS — E278 Other specified disorders of adrenal gland: Secondary | ICD-10-CM | POA: Diagnosis present

## 2022-06-08 DIAGNOSIS — I493 Ventricular premature depolarization: Secondary | ICD-10-CM | POA: Diagnosis not present

## 2022-06-08 DIAGNOSIS — Z7982 Long term (current) use of aspirin: Secondary | ICD-10-CM

## 2022-06-08 DIAGNOSIS — I358 Other nonrheumatic aortic valve disorders: Secondary | ICD-10-CM | POA: Diagnosis present

## 2022-06-08 DIAGNOSIS — D649 Anemia, unspecified: Secondary | ICD-10-CM | POA: Diagnosis present

## 2022-06-08 DIAGNOSIS — Z791 Long term (current) use of non-steroidal anti-inflammatories (NSAID): Secondary | ICD-10-CM

## 2022-06-08 DIAGNOSIS — Z79899 Other long term (current) drug therapy: Secondary | ICD-10-CM

## 2022-06-08 DIAGNOSIS — E876 Hypokalemia: Secondary | ICD-10-CM | POA: Diagnosis not present

## 2022-06-08 DIAGNOSIS — K219 Gastro-esophageal reflux disease without esophagitis: Secondary | ICD-10-CM | POA: Diagnosis present

## 2022-06-08 DIAGNOSIS — Z87891 Personal history of nicotine dependence: Secondary | ICD-10-CM

## 2022-06-08 DIAGNOSIS — F419 Anxiety disorder, unspecified: Secondary | ICD-10-CM | POA: Diagnosis present

## 2022-06-08 DIAGNOSIS — E119 Type 2 diabetes mellitus without complications: Principal | ICD-10-CM

## 2022-06-08 HISTORY — PX: KNEE ARTHROPLASTY: SHX992

## 2022-06-08 LAB — GLUCOSE, CAPILLARY
Glucose-Capillary: 95 mg/dL (ref 70–99)
Glucose-Capillary: 110 mg/dL — ABNORMAL HIGH (ref 70–99)
Glucose-Capillary: 132 mg/dL — ABNORMAL HIGH (ref 70–99)

## 2022-06-08 SURGERY — ARTHROPLASTY, KNEE, TOTAL, USING IMAGELESS COMPUTER-ASSISTED NAVIGATION
Anesthesia: Monitor Anesthesia Care | Site: Knee | Laterality: Left

## 2022-06-08 MED ORDER — OXYCODONE HCL 5 MG PO TABS: 5.0000 mg | ORAL_TABLET | ORAL | 0 refills | Status: DC | PRN

## 2022-06-08 MED ORDER — LACTATED RINGERS IV SOLN: INTRAVENOUS | Status: DC

## 2022-06-08 MED ORDER — BUPIVACAINE HCL (PF) 0.25 % IJ SOLN
INTRAMUSCULAR | Status: AC
  Filled 2022-06-08: qty 30

## 2022-06-08 MED ORDER — PROPOFOL 1000 MG/100ML IV EMUL
INTRAVENOUS | Status: AC
  Filled 2022-06-08: qty 100

## 2022-06-08 MED ORDER — ORAL CARE MOUTH RINSE: 15.0000 mL | Freq: Once | OROMUCOSAL | Status: AC

## 2022-06-08 MED ORDER — ACETAMINOPHEN 10 MG/ML IV SOLN: 1000.0000 mg | Freq: Once | INTRAVENOUS | Status: DC | PRN

## 2022-06-08 MED ORDER — FENTANYL CITRATE PF 50 MCG/ML IJ SOSY
PREFILLED_SYRINGE | INTRAMUSCULAR | Status: AC
  Filled 2022-06-08: qty 1

## 2022-06-08 MED ORDER — POVIDONE-IODINE 10 % EX SWAB: 2.0000 | Freq: Once | CUTANEOUS | Status: DC

## 2022-06-08 MED ORDER — EPHEDRINE SULFATE-NACL 50-0.9 MG/10ML-% IV SOSY
PREFILLED_SYRINGE | INTRAVENOUS | Status: DC | PRN
  Administered 2022-06-08 (×4): 5 mg via INTRAVENOUS

## 2022-06-08 MED ORDER — ORAL CARE MOUTH RINSE: 15.0000 mL | OROMUCOSAL | Status: DC | PRN

## 2022-06-08 MED ORDER — METHOCARBAMOL 500 MG IVPB - SIMPLE MED
INTRAVENOUS | Status: AC
  Filled 2022-06-08: qty 55

## 2022-06-08 MED ORDER — LACTATED RINGERS IV BOLUS
250.0000 mL | Freq: Once | INTRAVENOUS | Status: AC
  Administered 2022-06-08: 250 mL via INTRAVENOUS

## 2022-06-08 MED ORDER — DOCUSATE SODIUM 100 MG PO CAPS
100.0000 mg | ORAL_CAPSULE | Freq: Two times a day (BID) | ORAL | Status: DC
  Administered 2022-06-08 – 2022-06-17 (×18): 100 mg via ORAL
  Filled 2022-06-08 (×18): qty 1

## 2022-06-08 MED ORDER — FENTANYL CITRATE (PF) 100 MCG/2ML IJ SOLN
INTRAMUSCULAR | Status: AC
  Filled 2022-06-08: qty 2

## 2022-06-08 MED ORDER — ACETAMINOPHEN 160 MG/5ML PO SOLN: 1000.0000 mg | Freq: Once | ORAL | Status: DC | PRN

## 2022-06-08 MED ORDER — HYDROMORPHONE HCL 1 MG/ML IJ SOLN: 0.5000 mg | INTRAMUSCULAR | Status: DC | PRN

## 2022-06-08 MED ORDER — PHENOL 1.4 % MT LIQD: 1.0000 | OROMUCOSAL | Status: DC | PRN

## 2022-06-08 MED ORDER — KETOROLAC TROMETHAMINE 30 MG/ML IJ SOLN
INTRAMUSCULAR | Status: AC
  Filled 2022-06-08: qty 1

## 2022-06-08 MED ORDER — ISOPROPYL ALCOHOL 70 % SOLN
Status: AC
  Filled 2022-06-08: qty 480

## 2022-06-08 MED ORDER — ONDANSETRON HCL 4 MG/2ML IJ SOLN
4.0000 mg | Freq: Four times a day (QID) | INTRAMUSCULAR | Status: DC | PRN
  Administered 2022-06-09 – 2022-06-10 (×2): 4 mg via INTRAVENOUS
  Filled 2022-06-08 (×4): qty 2

## 2022-06-08 MED ORDER — POLYETHYLENE GLYCOL 3350 17 G PO PACK
17.0000 g | PACK | Freq: Every day | ORAL | Status: DC | PRN
  Administered 2022-06-16 – 2022-06-17 (×2): 17 g via ORAL
  Filled 2022-06-08: qty 1

## 2022-06-08 MED ORDER — MIDAZOLAM HCL 2 MG/2ML IJ SOLN
2.0000 mg | INTRAMUSCULAR | Status: DC
  Filled 2022-06-08: qty 2

## 2022-06-08 MED ORDER — ONDANSETRON HCL 4 MG PO TABS: 4.0000 mg | ORAL_TABLET | Freq: Three times a day (TID) | ORAL | 0 refills | Status: DC | PRN

## 2022-06-08 MED ORDER — ASPIRIN 81 MG PO CHEW
81.0000 mg | CHEWABLE_TABLET | Freq: Two times a day (BID) | ORAL | Status: DC
  Administered 2022-06-08 – 2022-06-17 (×18): 81 mg via ORAL
  Filled 2022-06-08 (×18): qty 1

## 2022-06-08 MED ORDER — SENNA 8.6 MG PO TABS: 2.0000 | ORAL_TABLET | Freq: Every day | ORAL | 1 refills | Status: AC

## 2022-06-08 MED ORDER — EPINEPHRINE PF 1 MG/ML IJ SOLN
INTRAMUSCULAR | Status: AC
  Filled 2022-06-08: qty 1

## 2022-06-08 MED ORDER — FENTANYL CITRATE PF 50 MCG/ML IJ SOSY
25.0000 ug | PREFILLED_SYRINGE | INTRAMUSCULAR | Status: DC | PRN
  Administered 2022-06-08: 50 ug via INTRAVENOUS

## 2022-06-08 MED ORDER — FENTANYL CITRATE (PF) 100 MCG/2ML IJ SOLN
INTRAMUSCULAR | Status: DC | PRN
  Administered 2022-06-08 (×5): 25 ug via INTRAVENOUS

## 2022-06-08 MED ORDER — DEXAMETHASONE SODIUM PHOSPHATE 10 MG/ML IJ SOLN
INTRAMUSCULAR | Status: DC | PRN
  Administered 2022-06-08: 8 mg via INTRAVENOUS

## 2022-06-08 MED ORDER — TRANEXAMIC ACID-NACL 1000-0.7 MG/100ML-% IV SOLN
1000.0000 mg | INTRAVENOUS | Status: AC
  Administered 2022-06-08: 1000 mg via INTRAVENOUS
  Filled 2022-06-08: qty 100

## 2022-06-08 MED ORDER — METOCLOPRAMIDE HCL 5 MG PO TABS
5.0000 mg | ORAL_TABLET | Freq: Three times a day (TID) | ORAL | Status: DC | PRN
  Administered 2022-06-14 – 2022-06-15 (×2): 10 mg via ORAL
  Administered 2022-06-16: 5 mg via ORAL
  Administered 2022-06-16: 10 mg via ORAL
  Filled 2022-06-08: qty 1
  Filled 2022-06-08 (×3): qty 2

## 2022-06-08 MED ORDER — ONDANSETRON HCL 4 MG/2ML IJ SOLN
INTRAMUSCULAR | Status: DC | PRN
  Administered 2022-06-08: 4 mg via INTRAVENOUS

## 2022-06-08 MED ORDER — SODIUM CHLORIDE (PF) 0.9 % IJ SOLN
INTRAMUSCULAR | Status: DC | PRN
  Administered 2022-06-08: 30 mL

## 2022-06-08 MED ORDER — POLYETHYLENE GLYCOL 3350 17 G PO PACK: 17.0000 g | PACK | Freq: Every day | ORAL | 0 refills | Status: DC | PRN

## 2022-06-08 MED ORDER — ALUM & MAG HYDROXIDE-SIMETH 200-200-20 MG/5ML PO SUSP
30.0000 mL | ORAL | Status: DC | PRN
  Administered 2022-06-16: 30 mL via ORAL
  Filled 2022-06-08: qty 30

## 2022-06-08 MED ORDER — OXYCODONE HCL 5 MG PO TABS: 5.0000 mg | ORAL_TABLET | Freq: Once | ORAL | Status: DC | PRN

## 2022-06-08 MED ORDER — BUPIVACAINE-EPINEPHRINE 0.25% -1:200000 IJ SOLN
INTRAMUSCULAR | Status: DC | PRN
  Administered 2022-06-08: 30 mL

## 2022-06-08 MED ORDER — DIPHENHYDRAMINE HCL 12.5 MG/5ML PO ELIX: 12.5000 mg | ORAL_SOLUTION | ORAL | Status: DC | PRN

## 2022-06-08 MED ORDER — FENTANYL CITRATE PF 50 MCG/ML IJ SOSY
100.0000 ug | PREFILLED_SYRINGE | INTRAMUSCULAR | Status: DC
  Administered 2022-06-08: 50 ug via INTRAVENOUS
  Filled 2022-06-08: qty 2

## 2022-06-08 MED ORDER — DEXAMETHASONE SODIUM PHOSPHATE 10 MG/ML IJ SOLN
INTRAMUSCULAR | Status: AC
  Filled 2022-06-08: qty 1

## 2022-06-08 MED ORDER — CEFAZOLIN SODIUM-DEXTROSE 2-4 GM/100ML-% IV SOLN
2.0000 g | INTRAVENOUS | Status: AC
  Administered 2022-06-08: 2 g via INTRAVENOUS
  Filled 2022-06-08: qty 100

## 2022-06-08 MED ORDER — KETOROLAC TROMETHAMINE 30 MG/ML IJ SOLN
INTRAMUSCULAR | Status: DC | PRN
  Administered 2022-06-08: 30 mg via INTRAMUSCULAR

## 2022-06-08 MED ORDER — ISOPROPYL ALCOHOL 70 % SOLN
Status: DC | PRN
  Administered 2022-06-08: 1 via TOPICAL

## 2022-06-08 MED ORDER — METHOCARBAMOL 500 MG IVPB - SIMPLE MED
500.0000 mg | Freq: Four times a day (QID) | INTRAVENOUS | Status: DC | PRN
  Administered 2022-06-08: 500 mg via INTRAVENOUS

## 2022-06-08 MED ORDER — BUPIVACAINE-EPINEPHRINE (PF) 0.5% -1:200000 IJ SOLN
INTRAMUSCULAR | Status: DC | PRN
  Administered 2022-06-08: 20 mL

## 2022-06-08 MED ORDER — INSULIN ASPART 100 UNIT/ML IJ SOLN: 0.0000 [IU] | Freq: Every day | INTRAMUSCULAR | Status: DC

## 2022-06-08 MED ORDER — OXYCODONE HCL 5 MG PO TABS
5.0000 mg | ORAL_TABLET | ORAL | Status: DC | PRN
  Administered 2022-06-08 – 2022-06-10 (×5): 5 mg via ORAL
  Filled 2022-06-08 (×3): qty 1
  Filled 2022-06-08: qty 2
  Filled 2022-06-08: qty 1

## 2022-06-08 MED ORDER — LACTATED RINGERS IV BOLUS
500.0000 mL | Freq: Once | INTRAVENOUS | Status: AC
  Administered 2022-06-08: 500 mL via INTRAVENOUS

## 2022-06-08 MED ORDER — OXYCODONE HCL 5 MG/5ML PO SOLN: 5.0000 mg | Freq: Once | ORAL | Status: DC | PRN

## 2022-06-08 MED ORDER — SODIUM CHLORIDE 0.9 % IV SOLN: INTRAVENOUS | Status: DC

## 2022-06-08 MED ORDER — METOCLOPRAMIDE HCL 5 MG/ML IJ SOLN
5.0000 mg | Freq: Three times a day (TID) | INTRAMUSCULAR | Status: DC | PRN
  Administered 2022-06-10: 5 mg via INTRAVENOUS
  Filled 2022-06-08 (×2): qty 2

## 2022-06-08 MED ORDER — SODIUM CHLORIDE 0.9 % IR SOLN
Status: DC | PRN
  Administered 2022-06-08 (×2): 1000 mL
  Administered 2022-06-08: 3000 mL

## 2022-06-08 MED ORDER — ASPIRIN 81 MG PO CHEW: 81.0000 mg | CHEWABLE_TABLET | Freq: Two times a day (BID) | ORAL | 0 refills | Status: AC

## 2022-06-08 MED ORDER — ACETAMINOPHEN 500 MG PO TABS
1000.0000 mg | ORAL_TABLET | Freq: Once | ORAL | Status: AC
  Administered 2022-06-08: 1000 mg via ORAL
  Filled 2022-06-08: qty 2

## 2022-06-08 MED ORDER — ONDANSETRON HCL 4 MG/2ML IJ SOLN
INTRAMUSCULAR | Status: AC
  Filled 2022-06-08: qty 2

## 2022-06-08 MED ORDER — ACETAMINOPHEN 325 MG PO TABS
325.0000 mg | ORAL_TABLET | Freq: Four times a day (QID) | ORAL | Status: DC | PRN
  Administered 2022-06-10 – 2022-06-16 (×9): 650 mg via ORAL
  Filled 2022-06-08 (×9): qty 2

## 2022-06-08 MED ORDER — CEFAZOLIN SODIUM-DEXTROSE 2-4 GM/100ML-% IV SOLN
2.0000 g | Freq: Four times a day (QID) | INTRAVENOUS | Status: AC
  Administered 2022-06-08: 2 g via INTRAVENOUS
  Filled 2022-06-08: qty 100

## 2022-06-08 MED ORDER — SENNA 8.6 MG PO TABS
1.0000 | ORAL_TABLET | Freq: Two times a day (BID) | ORAL | Status: DC
  Administered 2022-06-08 – 2022-06-17 (×18): 8.6 mg via ORAL
  Filled 2022-06-08 (×18): qty 1

## 2022-06-08 MED ORDER — SODIUM CHLORIDE (PF) 0.9 % IJ SOLN
INTRAMUSCULAR | Status: AC
  Filled 2022-06-08: qty 30

## 2022-06-08 MED ORDER — ACETAMINOPHEN 500 MG PO TABS
1000.0000 mg | ORAL_TABLET | Freq: Four times a day (QID) | ORAL | Status: AC
  Administered 2022-06-08 – 2022-06-09 (×4): 1000 mg via ORAL
  Filled 2022-06-08 (×4): qty 2

## 2022-06-08 MED ORDER — GLYCOPYRROLATE 0.2 MG/ML IJ SOLN
INTRAMUSCULAR | Status: DC | PRN
  Administered 2022-06-08 (×2): .1 mg via INTRAVENOUS

## 2022-06-08 MED ORDER — ACETAMINOPHEN 500 MG PO TABS: 1000.0000 mg | ORAL_TABLET | Freq: Once | ORAL | Status: DC | PRN

## 2022-06-08 MED ORDER — INSULIN ASPART 100 UNIT/ML IJ SOLN
0.0000 [IU] | Freq: Three times a day (TID) | INTRAMUSCULAR | Status: DC
  Administered 2022-06-08: 1 [IU] via SUBCUTANEOUS
  Administered 2022-06-10: 2 [IU] via SUBCUTANEOUS
  Administered 2022-06-13: 1 [IU] via SUBCUTANEOUS

## 2022-06-08 MED ORDER — OXYCODONE HCL 5 MG PO TABS: 10.0000 mg | ORAL_TABLET | ORAL | Status: DC | PRN

## 2022-06-08 MED ORDER — DOCUSATE SODIUM 100 MG PO CAPS: 100.0000 mg | ORAL_CAPSULE | Freq: Two times a day (BID) | ORAL | 1 refills | Status: AC

## 2022-06-08 MED ORDER — MELOXICAM 7.5 MG PO TABS: 7.5000 mg | ORAL_TABLET | Freq: Every day | ORAL | 3 refills | Status: AC | PRN

## 2022-06-08 MED ORDER — BUPIVACAINE IN DEXTROSE 0.75-8.25 % IT SOLN
INTRATHECAL | Status: DC | PRN
  Administered 2022-06-08: 1.8 mL via INTRATHECAL

## 2022-06-08 MED ORDER — CHLORHEXIDINE GLUCONATE 0.12 % MT SOLN
15.0000 mL | Freq: Once | OROMUCOSAL | Status: AC
  Administered 2022-06-08: 15 mL via OROMUCOSAL

## 2022-06-08 MED ORDER — ACETAMINOPHEN 500 MG PO TABS: 1000.0000 mg | ORAL_TABLET | Freq: Three times a day (TID) | ORAL | 0 refills | Status: AC | PRN

## 2022-06-08 MED ORDER — MENTHOL 3 MG MT LOZG: 1.0000 | LOZENGE | OROMUCOSAL | Status: DC | PRN

## 2022-06-08 MED ORDER — PROPOFOL 10 MG/ML IV BOLUS
INTRAVENOUS | Status: DC | PRN
  Administered 2022-06-08: 100 mg via INTRAVENOUS
  Administered 2022-06-08: 10 mg via INTRAVENOUS
  Administered 2022-06-08: 20 mg via INTRAVENOUS
  Administered 2022-06-08: 10 mg via INTRAVENOUS
  Administered 2022-06-08: 20 mg via INTRAVENOUS

## 2022-06-08 MED ORDER — PROPOFOL 10 MG/ML IV BOLUS
INTRAVENOUS | Status: AC
  Filled 2022-06-08: qty 20

## 2022-06-08 MED ORDER — ONDANSETRON HCL 4 MG PO TABS
4.0000 mg | ORAL_TABLET | Freq: Four times a day (QID) | ORAL | Status: DC | PRN
  Administered 2022-06-10 – 2022-06-16 (×10): 4 mg via ORAL
  Filled 2022-06-08 (×10): qty 1

## 2022-06-08 MED ORDER — METHOCARBAMOL 500 MG PO TABS
500.0000 mg | ORAL_TABLET | Freq: Four times a day (QID) | ORAL | Status: DC | PRN
  Administered 2022-06-08 – 2022-06-11 (×5): 500 mg via ORAL
  Filled 2022-06-08 (×5): qty 1

## 2022-06-08 MED ORDER — LIDOCAINE HCL (PF) 2 % IJ SOLN
INTRAMUSCULAR | Status: AC
  Filled 2022-06-08: qty 5

## 2022-06-08 MED ORDER — PHENYLEPHRINE HCL-NACL 20-0.9 MG/250ML-% IV SOLN
INTRAVENOUS | Status: DC | PRN
  Administered 2022-06-08: 40 ug/min via INTRAVENOUS

## 2022-06-08 SURGICAL SUPPLY — 68 items
ADH SKN CLS APL DERMABOND .7 (GAUZE/BANDAGES/DRESSINGS) ×2
APL PRP STRL LF DISP 70% ISPRP (MISCELLANEOUS) ×2
BAG COUNTER SPONGE SURGICOUNT (BAG) IMPLANT
BAG SPEC THK2 15X12 ZIP CLS (MISCELLANEOUS)
BAG SPNG CNTER NS LX DISP (BAG)
BAG ZIPLOCK 12X15 (MISCELLANEOUS) IMPLANT
BATTERY INSTRU NAVIGATION (MISCELLANEOUS) ×6 IMPLANT
BLADE SAW RECIPROCATING 77.5 (BLADE) ×2 IMPLANT
BNDG ELASTIC 4X5.8 VLCR STR LF (GAUZE/BANDAGES/DRESSINGS) ×2 IMPLANT
BNDG ELASTIC 6X5.8 VLCR STR LF (GAUZE/BANDAGES/DRESSINGS) ×2 IMPLANT
BTRY SRG DRVR LF (MISCELLANEOUS) ×3
CHLORAPREP W/TINT 26 (MISCELLANEOUS) ×4 IMPLANT
COMP FEM KNEE PS NRW 8 LT (Joint) ×1 IMPLANT
COMP TIB KNEE PS 0D LT (Joint) ×1 IMPLANT
COMPONENT FEM KNEE PS NRW 8 LT (Joint) IMPLANT
COMPONENT TIB KNEE PS 0D LT (Joint) IMPLANT
COVER SURGICAL LIGHT HANDLE (MISCELLANEOUS) ×2 IMPLANT
DERMABOND ADVANCED .7 DNX12 (GAUZE/BANDAGES/DRESSINGS) ×4 IMPLANT
DRAPE SHEET LG 3/4 BI-LAMINATE (DRAPES) ×6 IMPLANT
DRAPE U-SHAPE 47X51 STRL (DRAPES) ×2 IMPLANT
DRSG AQUACEL AG ADV 3.5X10 (GAUZE/BANDAGES/DRESSINGS) ×2 IMPLANT
ELECT BLADE TIP CTD 4 INCH (ELECTRODE) ×2 IMPLANT
ELECT REM PT RETURN 15FT ADLT (MISCELLANEOUS) ×2 IMPLANT
GAUZE SPONGE 4X4 12PLY STRL (GAUZE/BANDAGES/DRESSINGS) ×2 IMPLANT
GLOVE BIO SURGEON STRL SZ7 (GLOVE) ×2 IMPLANT
GLOVE BIO SURGEON STRL SZ8.5 (GLOVE) ×4 IMPLANT
GLOVE BIOGEL PI IND STRL 7.5 (GLOVE) ×2 IMPLANT
GLOVE BIOGEL PI IND STRL 8.5 (GLOVE) ×2 IMPLANT
GOWN SPEC L3 XXLG W/TWL (GOWN DISPOSABLE) ×2 IMPLANT
GOWN STRL REUS W/ TWL XL LVL3 (GOWN DISPOSABLE) ×2 IMPLANT
GOWN STRL REUS W/TWL XL LVL3 (GOWN DISPOSABLE) ×1
HANDPIECE INTERPULSE COAX TIP (DISPOSABLE) ×1
HDLS TROCR DRIL PIN KNEE 75 (PIN) ×1
HOLDER FOLEY CATH W/STRAP (MISCELLANEOUS) ×2 IMPLANT
HOOD PEEL AWAY T7 (MISCELLANEOUS) ×6 IMPLANT
IMPL PATELLA METAL SZ32X10 (Joint) IMPLANT
KIT TURNOVER KIT A (KITS) IMPLANT
MARKER SKIN DUAL TIP RULER LAB (MISCELLANEOUS) ×2 IMPLANT
NDL SAFETY ECLIP 18X1.5 (MISCELLANEOUS) ×2 IMPLANT
NDL SPNL 18GX3.5 QUINCKE PK (NEEDLE) ×2 IMPLANT
NEEDLE SPNL 18GX3.5 QUINCKE PK (NEEDLE) ×1 IMPLANT
NS IRRIG 1000ML POUR BTL (IV SOLUTION) ×2 IMPLANT
PACK TOTAL KNEE CUSTOM (KITS) ×2 IMPLANT
PADDING CAST COTTON 6X4 STRL (CAST SUPPLIES) ×2 IMPLANT
PIN DRILL HDLS TROCAR 75 4PK (PIN) IMPLANT
PROTECTOR NERVE ULNAR (MISCELLANEOUS) ×2 IMPLANT
SAW OSC TIP CART 19.5X105X1.3 (SAW) ×2 IMPLANT
SEALER BIPOLAR AQUA 6.0 (INSTRUMENTS) ×2 IMPLANT
SET HNDPC FAN SPRY TIP SCT (DISPOSABLE) ×2 IMPLANT
SET PAD KNEE POSITIONER (MISCELLANEOUS) ×2 IMPLANT
SOLUTION PRONTOSAN WOUND 350ML (IRRIGATION / IRRIGATOR) IMPLANT
SPIKE FLUID TRANSFER (MISCELLANEOUS) ×4 IMPLANT
STEM TIBIAL 10 8-11 EF POLY LT (Joint) IMPLANT
SUT MNCRL AB 3-0 PS2 18 (SUTURE) ×2 IMPLANT
SUT MNCRL AB 4-0 PS2 18 (SUTURE) IMPLANT
SUT MON AB 2-0 CT1 36 (SUTURE) ×2 IMPLANT
SUT STRATAFIX PDO 1 14 VIOLET (SUTURE) ×1
SUT STRATFX PDO 1 14 VIOLET (SUTURE) ×1
SUT VIC AB 1 CTX 36 (SUTURE) ×2
SUT VIC AB 1 CTX36XBRD ANBCTR (SUTURE) ×4 IMPLANT
SUT VIC AB 2-0 CT1 27 (SUTURE) ×1
SUT VIC AB 2-0 CT1 TAPERPNT 27 (SUTURE) ×2 IMPLANT
SUTURE STRATFX PDO 1 14 VIOLET (SUTURE) ×2 IMPLANT
SYR 3ML LL SCALE MARK (SYRINGE) ×2 IMPLANT
TRAY FOLEY MTR SLVR 16FR STAT (SET/KITS/TRAYS/PACK) IMPLANT
TUBE SUCTION HIGH CAP CLEAR NV (SUCTIONS) ×2 IMPLANT
WATER STERILE IRR 1000ML POUR (IV SOLUTION) ×4 IMPLANT
WRAP KNEE MAXI GEL POST OP (GAUZE/BANDAGES/DRESSINGS) IMPLANT

## 2022-06-08 NOTE — Discharge Instructions (Signed)
Dr. Rod Can Total Joint Specialist [facility] [address], [place], [place] [address] [phone]  TOTAL KNEE REPLACEMENT POSTOPERATIVE DIRECTIONS    Knee Rehabilitation, Guidelines Following Surgery  Results after knee surgery are often greatly improved when you follow the exercise, range of motion and muscle strengthening exercises prescribed by your doctor. Safety measures are also important to protect the knee from further injury. Any time any of these exercises cause you to have increased pain or swelling in your knee joint, decrease the amount until you are comfortable again and slowly increase them. If you have problems or questions, call your caregiver or physical therapist for advice.   WEIGHT BEARING Weight bearing as tolerated with assist device (walker, cane, etc) as directed, use it as long as suggested by your surgeon or therapist, typically at least 4-6 weeks.  HOME CARE INSTRUCTIONS  Remove items at home which could result in a fall. This includes throw rugs or furniture in walking pathways.  Continue medications as instructed at time of discharge. You may have some home medications which will be placed on hold until you complete the course of blood thinner medication.  You may start showering once you are discharged home but do not submerge the incision under water. Just pat the incision dry and apply a dry gauze dressing on daily. Walk with walker as instructed.  You may resume a sexual relationship in one month or when given the OK by your doctor.  Use walker as long as suggested by your caregivers. Avoid periods of inactivity such as sitting longer than an hour when not asleep. This helps prevent blood clots.  You may put full weight on your legs and walk as much as is comfortable.  You may return to work once you are cleared by your doctor.  Do not drive a car for 6 weeks or until released by you surgeon.  Do not drive while  taking narcotics.  Wear the elastic stockings for three weeks following surgery during the day but you may remove then at night. Make sure you keep all of your appointments after your operation with all of your doctors and caregivers. You should call the office at the above phone number and make an appointment for approximately two weeks after the date of your surgery. Do not remove your surgical dressing. The dressing is waterproof; you may take showers in 3 days, but do not take tub baths or submerge the dressing. Please pick up a stool softener and laxative for home use as long as you are requiring pain medications. ICE to the affected knee every three hours for 30 minutes at a time and then as needed for pain and swelling.  Continue to use ice on the knee for pain and swelling from surgery. You may notice swelling that will progress down to the foot and ankle.  This is normal after surgery.  Elevate the leg when you are not up walking on it.   It is important for you to complete the blood thinner medication as prescribed by your doctor. Continue to use the breathing machine which will help keep your temperature down.  It is common for your temperature to cycle up and down following surgery, especially at night when you are not up moving around and exerting yourself.  The breathing machine keeps your lungs expanded and your temperature down.  RANGE OF MOTION AND STRENGTHENING EXERCISES  Rehabilitation of the knee is important following a knee injury or an  operation. After just a few days of immobilization, the muscles of the thigh which control the knee become weakened and shrink (atrophy). Knee exercises are designed to build up the tone and strength of the thigh muscles and to improve knee motion. Often times heat used for twenty to thirty minutes before working out will loosen up your tissues and help with improving the range of motion but do not use heat for the first two weeks following surgery.  These exercises can be done on a training (exercise) mat, on the floor, on a table or on a bed. Use what ever works the best and is most comfortable for you Knee exercises include:  Leg Lifts - While your knee is still immobilized in a splint or cast, you can do straight leg raises. Lift the leg to 60 degrees, hold for 3 sec, and slowly lower the leg. Repeat 10-20 times 2-3 times daily. Perform this exercise against resistance later as your knee gets better.  Quad and Hamstring Sets - Tighten up the muscle on the front of the thigh (Quad) and hold for 5-10 sec. Repeat this 10-20 times hourly. Hamstring sets are done by pushing the foot backward against an object and holding for 5-10 sec. Repeat as with quad sets.  A rehabilitation program following serious knee injuries can speed recovery and prevent re-injury in the future due to weakened muscles. Contact your doctor or a physical therapist for more information on knee rehabilitation.   POST-OPERATIVE OPIOID TAPER INSTRUCTIONS: It is important to wean off of your opioid medication as soon as possible. If you do not need pain medication after your surgery it is ok to stop day one. Opioids include: Codeine, Hydrocodone(Norco, Vicodin), Oxycodone(Percocet, oxycontin) and hydromorphone amongst others.  Long term and even short term use of opiods can cause: Increased pain response Dependence Constipation Depression Respiratory depression And more.  Withdrawal symptoms can include Flu like symptoms Nausea, vomiting And more Techniques to manage these symptoms Hydrate well Eat regular healthy meals Stay active Use relaxation techniques(deep breathing, meditating, yoga) Do Not substitute Alcohol to help with tapering If you have been on opioids for less than two weeks and do not have pain than it is ok to stop all together.  Plan to wean off of opioids This plan should start within one week post op of your joint replacement. Maintain the same  interval or time between taking each dose and first decrease the dose.  Cut the total daily intake of opioids by one tablet each day Next start to increase the time between doses. The last dose that should be eliminated is the evening dose.    SKILLED REHAB INSTRUCTIONS: If the patient is transferred to a skilled rehab facility following release from the hospital, a list of the current medications will be sent to the facility for the patient to continue.  When discharged from the skilled rehab facility, please have the facility set up the patient's [place] prior to being released. Also, the skilled facility will be responsible for providing the patient with their medications at time of release from the facility to include their pain medication, the muscle relaxants, and their blood thinner medication. If the patient is still at the rehab facility at time of the two week follow up appointment, the skilled rehab facility will also need to assist the patient in arranging follow up appointment in our office and any transportation needs.  MAKE SURE YOU:  Understand these instructions.  Will watch  your condition.  Will get help right away if you are not doing well or get worse.    Pick up stool softner and laxative for home use following surgery while on pain medications. Do NOT remove your dressing. You may shower.  Do not take tub baths or submerge incision under water. May shower starting three days after surgery. Please use a clean towel to pat the incision dry following showers. Continue to use ice for pain and swelling after surgery. Do not use any lotions or creams on the incision until instructed by your surgeon.  

## 2022-06-08 NOTE — Transfer of Care (Signed)
Immediate Anesthesia Transfer of Care Note  Patient: Kimberly Sellers  Procedure(s) Performed: COMPUTER ASSISTED TOTAL KNEE ARTHROPLASTY (Left: Knee)  Patient Location: PACU  Anesthesia Type:GA combined with regional for post-op pain  Level of Consciousness: awake and patient cooperative  Airway & Oxygen Therapy: Patient Spontanous Breathing  Post-op Assessment: Report given to RN and Post -op Vital signs reviewed and stable  Post vital signs: Reviewed and stable  Last Vitals:  Vitals Value Taken Time  BP 117/71 06/08/22 1201  Temp    Pulse 94 06/08/22 1203  Resp 20 06/08/22 1203  SpO2 95 % 06/08/22 1203  Vitals shown include unvalidated device data.  Last Pain:  Vitals:   06/08/22 0720  TempSrc: Oral         Complications: No notable events documented.

## 2022-06-08 NOTE — Anesthesia Procedure Notes (Signed)
Spinal  Patient location during procedure: OR Start time: 06/08/2022 9:29 AM End time: 06/08/2022 9:49 AM Reason for block: surgical anesthesia Staffing Anesthesiologist: Oleta Mouse, MD Performed by: Oleta Mouse, MD Authorized by: Oleta Mouse, MD   Preanesthetic Checklist Completed: patient identified, IV checked, risks and benefits discussed, surgical consent, monitors and equipment checked, pre-op evaluation and timeout performed Spinal Block Patient position: sitting Prep: DuraPrep Patient monitoring: heart rate, cardiac monitor, continuous pulse ox and blood pressure Approach: midline Injection technique: single-shot Needle Needle type: Pencan  Needle gauge: 24 G Needle length: 9 cm Assessment Events: CSF return and second provider Additional Notes Took over procedure and started with new needle at L3/4. Initial pass with CSF flow but unable to aspirate. Needle adjustment attempted 5-6 times with continued CSF flow but unable to aspirate. Trial injection difficult. Moved to L5/S1. Initial puncture with CSF flow but again unable to aspirate. Needle reposition 3-4 times with brief ability to aspirate and decreased resistance to Injectate. No pain or parasthesias noted. Spinal did not set up and proceeded to general.

## 2022-06-08 NOTE — Interval H&P Note (Signed)
History and Physical Interval Note:  06/08/2022 7:00 AM  Kimberly Sellers  has presented today for surgery, with the diagnosis of Left knee osteoarthritis.  The various methods of treatment have been discussed with the patient and family. After consideration of risks, benefits and other options for treatment, the patient has consented to  Procedure(s) with comments: Taylor (Left) - 160 as a surgical intervention.  The patient's history has been reviewed, patient examined, no change in status, stable for surgery.  I have reviewed the patient's chart and labs.  Questions were answered to the patient's satisfaction.     Kimberly Sellers Edie Darley

## 2022-06-08 NOTE — Anesthesia Preprocedure Evaluation (Signed)
Anesthesia Evaluation  Patient identified by MRN, date of birth, ID band Patient awake    Reviewed: Allergy & Precautions, NPO status , Patient's Chart, lab work & pertinent test results  History of Anesthesia Complications Negative for: history of anesthetic complications  Airway Mallampati: I  TM Distance: >3 FB Neck ROM: Full    Dental  (+) Edentulous Upper, Edentulous Lower, Upper Dentures, Lower Dentures, Dental Advisory Given   Pulmonary neg shortness of breath, neg sleep apnea, neg COPD, neg recent URI, former smoker   breath sounds clear to auscultation       Cardiovascular hypertension, Pt. on medications (-) angina (-) Past MI and (-) CHF  Rhythm:Regular   1. Left ventricular ejection fraction, by estimation, is 60 to 65%. Left  ventricular ejection fraction by PLAX is 61 %. The left ventricle has  normal function. The left ventricle has no regional wall motion  abnormalities. Left ventricular diastolic  parameters are consistent with Grade I diastolic dysfunction (impaired  relaxation). The average left ventricular global longitudinal strain is  -22.4 %. The global longitudinal strain is normal.   2. Right ventricular systolic function is normal. The right ventricular  size is normal. There is normal pulmonary artery systolic pressure. The  estimated right ventricular systolic pressure is 83.1 mmHg.   3. The mitral valve is abnormal. Trivial mitral valve regurgitation.   4. The aortic valve is tricuspid. Aortic valve regurgitation is trivial.  Mild to moderate aortic valve sclerosis/calcification is present, without  any evidence of aortic stenosis. Aortic regurgitation PHT measures 706  msec. Aortic valve mean gradient  measures 8.0 mmHg. DI is 0.51.     Neuro/Psych neg Seizures PSYCHIATRIC DISORDERS Anxiety     negative neurological ROS     GI/Hepatic Neg liver ROS,GERD  Medicated and Controlled,,  Endo/Other   diabetes, Type 2    Renal/GU negative Renal ROS     Musculoskeletal  (+) Arthritis ,    Abdominal   Peds  Hematology negative hematology ROS (+) Lab Results      Component                Value               Date                      WBC                      8.2                 05/31/2022                HGB                      12.7                05/31/2022                HCT                      40.2                05/31/2022                MCV                      82.0  05/31/2022                PLT                      351                 05/31/2022              Anesthesia Other Findings Denies blood thinners  Reproductive/Obstetrics                              Anesthesia Physical Anesthesia Plan  ASA: 2  Anesthesia Plan: MAC, Regional and Spinal   Post-op Pain Management: Regional block* and Tylenol PO (pre-op)*   Induction: Intravenous  PONV Risk Score and Plan: 2 and Propofol infusion and Ondansetron  Airway Management Planned: Nasal Cannula and Natural Airway  Additional Equipment: None  Intra-op Plan:   Post-operative Plan:   Informed Consent: I have reviewed the patients History and Physical, chart, labs and discussed the procedure including the risks, benefits and alternatives for the proposed anesthesia with the patient or authorized representative who has indicated his/her understanding and acceptance.     Dental advisory given  Plan Discussed with: CRNA  Anesthesia Plan Comments:          Anesthesia Quick Evaluation

## 2022-06-08 NOTE — Anesthesia Procedure Notes (Signed)
Anesthesia Regional Block: Adductor canal block   Pre-Anesthetic Checklist: , timeout performed,  Correct Patient, Correct Site, Correct Laterality,  Correct Procedure, Correct Position, site marked,  Risks and benefits discussed,  Surgical consent,  Pre-op evaluation,  At surgeon's request and post-op pain management  Laterality: Lower and Left  Prep: chloraprep       Needles:  Injection technique: Single-shot      Needle Length: 9cm  Needle Gauge: 22     Additional Needles: Arrow StimuQuik ECHO Echogenic Stimulating PNB Needle  Procedures:,,,, ultrasound used (permanent image in chart),,    Narrative:  Start time: 06/08/2022 8:49 AM End time: 06/08/2022 8:54 AM Injection made incrementally with aspirations every 5 mL.  Performed by: Personally  Anesthesiologist: Oleta Mouse, MD

## 2022-06-08 NOTE — Evaluation (Signed)
Physical Therapy Evaluation Patient Details Name: Kimberly Sellers MRN: [id number] DOB: 1945/08/18 Today's Date: 06/08/2022  History of Present Illness  Pt is a 77yo female presenting s/p L-TKA on 06/08/22. PMH: DM, GERD, HTN,  Clinical Impression  Kimberly Sellers is a 77 y.o. female POD 0 s/p L-TKA. Patient reports modified independence using SPC with mobility at baseline. Patient is now limited by functional impairments (see PT problem list below) and requires min assist for bed mobility and  for transfers. Patient was able to ambulate 10 feet with RW and min guard level of assist. Patient instructed in exercise to facilitate ROM and circulation to manage edema. Provided incentive spirometer and with Vcs pt able to achieve . Patient will benefit from continued skilled PT interventions to address impairments and progress towards PLOF. Acute PT will follow to progress mobility and stair training in preparation for safe discharge home.       Recommendations for follow up therapy are one component of a multi-disciplinary discharge planning process, led by the attending physician.  Recommendations may be updated based on patient status, additional functional criteria and insurance authorization.  Follow Up Recommendations Follow physician's recommendations for discharge plan and follow up therapies      Assistance Recommended at Discharge Frequent or constant Supervision/Assistance  Patient can return home with the following  A little help with walking and/or transfers;A little help with bathing/dressing/bathroom;Assistance with cooking/housework;Help with stairs or ramp for entrance;Assist for transportation    Equipment Recommendations None recommended by PT  Recommendations for Other Services       Functional Status Assessment Patient has had a recent decline in their functional status and demonstrates the ability to make significant improvements in function in a reasonable and  predictable amount of time.     Precautions / Restrictions Precautions Precautions: Fall;Knee Precaution Booklet Issued: No Precaution Comments: no pillow under the knee Restrictions Weight Bearing Restrictions: No Other Position/Activity Restrictions: wbat      Mobility  Bed Mobility Overal bed mobility: Needs Assistance Bed Mobility: Supine to Sit     Supine to sit: Min assist, HOB elevated     General bed mobility comments: to bring LLE off bed, otherwise min guard    Transfers Overall transfer level: Needs assistance Equipment used: Rolling walker (2 wheels) Transfers: Sit to/from Stand Sit to Stand: Min assist, From elevated surface           General transfer comment: Min assist for steadying of RW as pt pulled up on it, light lift assist.    Ambulation/Gait Ambulation/Gait assistance: Min guard Gait Distance (Feet): 10 Feet Assistive device: Rolling walker (2 wheels) Gait Pattern/deviations: Step-to pattern Gait velocity: decreased     General Gait Details: Pt ambulated with RW and min guard, no physical assist required or overt LOB noted. Mild dizziness during ambulation that resolved with seated rest.  Stairs            Wheelchair Mobility    Modified Rankin (Stroke Patients Only)       Balance Overall balance assessment: Needs assistance Sitting-balance support: Feet supported, No upper extremity supported Sitting balance-Leahy Scale: Good     Standing balance support: Reliant on assistive device for balance, During functional activity, Bilateral upper extremity supported Standing balance-Leahy Scale: Poor                               Pertinent Vitals/Pain Pain Assessment Pain Assessment: 0-10 Pain  Score: 6  Pain Location: left knee Pain Descriptors / Indicators: Operative site guarding Pain Intervention(s): Limited activity within patient's tolerance, Monitored during session, Repositioned, Ice applied    Home  Living Family/patient expects to be discharged to:: Private residence Living Arrangements: Children Available Help at Discharge: Family;Available 24 hours/day Type of Home: House Home Access: Stairs to enter Entrance Stairs-Rails: None Entrance Stairs-Number of Steps: 2   Home Layout: One level Home Equipment: Toilet riser;BSC/3in1;Cane - single point;Rolling Environmental consultant (2 wheels)      Prior Function Prior Level of Function : Independent/Modified Independent             Mobility Comments: SPC ADLs Comments: IND     Hand Dominance        Extremity/Trunk Assessment   Upper Extremity Assessment Upper Extremity Assessment: Overall WFL for tasks assessed    Lower Extremity Assessment Lower Extremity Assessment: LLE deficits/detail;RLE deficits/detail RLE Deficits / Details: MMT ank DF/PF 5/5 RLE Sensation: WNL LLE Deficits / Details: MMT ank DF/PF 5/5, no extensor lag noted LLE Sensation: WNL    Cervical / Trunk Assessment Cervical / Trunk Assessment: Kyphotic  Communication   Communication: No difficulties  Cognition Arousal/Alertness: Awake/alert Behavior During Therapy: WFL for tasks assessed/performed Overall Cognitive Status: Within Functional Limits for tasks assessed                                          General Comments General comments (skin integrity, edema, etc.): Daughters present    Exercises Total Joint Exercises Ankle Circles/Pumps: AROM, Both, 20 reps   Assessment/Plan    PT Assessment Patient needs continued PT services  PT Problem List Decreased strength;Decreased range of motion;Decreased activity tolerance;Decreased balance;Decreased mobility;Decreased coordination;Pain       PT Treatment Interventions DME instruction;Gait training;Stair training;Functional mobility training;Therapeutic activities;Therapeutic exercise;Balance training;Patient/family education;Neuromuscular re-education    PT Goals (Current goals can be  found in the Care Plan section)  Acute Rehab PT Goals Patient Stated Goal: To walk without pain PT Goal Formulation: With patient Time For Goal Achievement: 06/15/22 Potential to Achieve Goals: Good    Frequency 7X/week     Co-evaluation               AM-PAC PT "6 Clicks" Mobility  Outcome Measure Help needed turning from your back to your side while in a flat bed without using bedrails?: A Little Help needed moving from lying on your back to sitting on the side of a flat bed without using bedrails?: A Little Help needed moving to and from a bed to a chair (including a wheelchair)?: A Little Help needed standing up from a chair using your arms (e.g., wheelchair or bedside chair)?: A Little Help needed to walk in hospital room?: A Little Help needed climbing 3-5 steps with a railing? : A Little 6 Click Score: 18    End of Session Equipment Utilized During Treatment: Gait belt Activity Tolerance: Patient tolerated treatment well Patient left: in chair;with call bell/phone within reach;with chair alarm set;with family/visitor present Nurse Communication: Mobility status PT Visit Diagnosis: Pain;Difficulty in walking, not elsewhere classified (R26.2) Pain - Right/Left: Left Pain - part of body: Knee    Time: 1710-1734 PT Time Calculation (min) (ACUTE ONLY): 24 min   Charges:   PT Evaluation $PT Eval Low Complexity: 1 Low PT Treatments $Gait Training: 8-22 mins        Gwyndolyn Saxon  Carlynn Purl, [place] [facility] Department Office: [phone] Weekend pager: [phone]  Coolidge Breeze 06/08/2022, 5:40 PM

## 2022-06-08 NOTE — Anesthesia Procedure Notes (Signed)
Procedure Name: LMA Insertion Date/Time: 06/08/2022 9:45 AM  Performed by: Gwyndolyn Saxon, CRNAPre-anesthesia Checklist: Patient identified, Emergency Drugs available, Suction available and Patient being monitored Patient Re-evaluated:Patient Re-evaluated prior to induction Oxygen Delivery Method: Circle system utilized Preoxygenation: Pre-oxygenation with 100% oxygen Induction Type: IV induction Ventilation: Mask ventilation without difficulty LMA: LMA with gastric port inserted LMA Size: 4.0 Number of attempts: 1 Placement Confirmation: positive ETCO2 and breath sounds checked- equal and bilateral Tube secured with: Tape Dental Injury: Teeth and Oropharynx as per pre-operative assessment

## 2022-06-08 NOTE — Op Note (Signed)
OPERATIVE REPORT  SURGEON: Rod Can, MD   ASSISTANT: Larene Pickett, PA-C  PREOPERATIVE DIAGNOSIS: Primary Left knee arthritis.   POSTOPERATIVE DIAGNOSIS: Primary Left knee arthritis.   PROCEDURE: Computer assisted Left total knee arthroplasty.   IMPLANTS: Zimmer Persona PPS Cementless CR femur, size 8 Narrow. Persona 0 degree Spiked Keel OsseoTi Tibia, size F. Vivacit-E polyethelyene insert, size 10 mm, MC. TM standard patella, size 32 mm.  ANESTHESIA:  MAC, General, Regional, and Spinal  TOURNIQUET TIME: Not utilized.   ESTIMATED BLOOD LOSS:-150 mL    ANTIBIOTICS: 2g Ancef.  DRAINS: None.  COMPLICATIONS: None   CONDITION: PACU - hemodynamically stable.   BRIEF CLINICAL NOTE: Kimberly Sellers is a 77 y.o. female with a long-standing history of Left knee arthritis. After failing conservative management, the patient was indicated for total knee arthroplasty. The risks, benefits, and alternatives to the procedure were explained, and the patient elected to proceed.  PROCEDURE IN DETAIL: Adductor canal block was obtained in the pre-op holding area. Once inside the operative room, spinal anesthesia was obtained, and a foley catheter was inserted. The patient was then positioned and the lower extremity was prepped and draped in the normal sterile surgical fashion.  A time-out was called verifying side and site of surgery. The patient received IV antibiotics within 60 minutes of beginning the procedure. A tourniquet was not utilized.   An anterior approach to the knee was performed utilizing a midvastus arthrotomy. A medial release was performed and the patellar fat pad was excised. Stryker imageless navigation was used to cut the distal femur perpendicular to the mechanical axis. A freehand patellar resection was performed, and the patella was sized an prepared with 3 lug holes.  Nagivation was used to make a neutral proximal tibia resection, taking 6 mm of bone from the less  affected medial side with 3 degrees of slope. The menisci were excised. A spacer block was placed, and the alignment and balance in extension were confirmed.   The distal femur was sized using the 3-degree external rotation guide referencing the posterior femoral cortex. The appropriate 4-in-1 cutting block was pinned into place. Rotation was checked using Whiteside's line, the epicondylar axis, and then confirmed with a spacer block in flexion. The remaining femoral cuts were performed, taking care to protect the MCL.  The tibia was sized and the trial tray was pinned into place. The remaining trail components were inserted. The knee was stable to varus and valgus stress through a full range of motion. The patella tracked centrally, and the PCL was well balanced. The trial components were removed, and the proximal tibial surface was prepared. Final components were impacted into place. The knee was tested for a final time and found to be well balanced.   The wound was copiously irrigated with Prontosan solution and normal saline using pule lavage.  Marcaine solution was injected into the periarticular soft tissue.  The wound was closed in layers using #1 Vicryl and Stratafix for the fascia, 2-0 Vicryl for the subcutaneous fat, 2-0 Monocryl for the deep dermal layer, and staples for the skin. Dermabond was applied to the skin.  Once the glue was fully dried, an Aquacell Ag and compressive dressing were applied.  The patient was transported to the recovery room in stable condition.  Sponge, needle, and instrument counts were correct at the end of the case x2.  The patient tolerated the procedure well and there were no known complications.  Please note that a surgical assistant was a  medical necessity for this procedure in order to perform it in a safe and expeditious manner. Surgical assistant was necessary to retract the ligaments and vital neurovascular structures to prevent injury to them and also necessary  for proper positioning of the limb to allow for anatomic placement of the prosthesis.

## 2022-06-09 ENCOUNTER — Encounter (HOSPITAL_COMMUNITY): Payer: Self-pay | Admitting: Orthopedic Surgery

## 2022-06-09 DIAGNOSIS — R001 Bradycardia, unspecified: Secondary | ICD-10-CM | POA: Diagnosis not present

## 2022-06-09 DIAGNOSIS — D649 Anemia, unspecified: Secondary | ICD-10-CM | POA: Diagnosis present

## 2022-06-09 LAB — BASIC METABOLIC PANEL
Anion gap: 5 (ref 5–15)
BUN: 14 mg/dL (ref 8–23)
CO2: 24 mmol/L (ref 22–32)
Calcium: 8 mg/dL — ABNORMAL LOW (ref 8.9–10.3)
Chloride: 112 mmol/L — ABNORMAL HIGH (ref 98–111)
Creatinine, Ser: 0.64 mg/dL (ref 0.44–1.00)
GFR, Estimated: >60 mL/min (ref >60)
Glucose, Bld: 112 mg/dL — ABNORMAL HIGH (ref 70–99)
Potassium: 3.9 mmol/L (ref 3.5–5.1)
Sodium: 141 mmol/L (ref 135–145)

## 2022-06-09 LAB — CBC
HCT: 29.5 % — ABNORMAL LOW (ref 36.0–46.0)
Hemoglobin: 9.3 g/dL — ABNORMAL LOW (ref 12.0–15.0)
MCH: 26.3 pg (ref 26.0–34.0)
MCHC: 31.5 g/dL (ref 30.0–36.0)
MCV: 83.3 fL (ref 80.0–100.0)
Platelets: 254 10*3/uL (ref 150–400)
RBC: 3.54 MIL/uL — ABNORMAL LOW (ref 3.87–5.11)
RDW: 16.3 % — ABNORMAL HIGH (ref 11.5–15.5)
WBC: 11.7 10*3/uL — ABNORMAL HIGH (ref 4.0–10.5)
nRBC: 0 % (ref 0.0–0.2)

## 2022-06-09 LAB — GLUCOSE, CAPILLARY
Glucose-Capillary: 142 mg/dL — ABNORMAL HIGH (ref 70–99)
Glucose-Capillary: 96 mg/dL (ref 70–99)
Glucose-Capillary: 95 mg/dL (ref 70–99)
Glucose-Capillary: 108 mg/dL — ABNORMAL HIGH (ref 70–99)
Glucose-Capillary: 120 mg/dL — ABNORMAL HIGH (ref 70–99)

## 2022-06-09 LAB — MAGNESIUM: Magnesium: 1.9 mg/dL (ref 1.7–2.4)

## 2022-06-09 LAB — TSH: TSH: 0.362 u[IU]/mL (ref 0.350–4.500)

## 2022-06-09 MED ORDER — LISINOPRIL 20 MG PO TABS
20.0000 mg | ORAL_TABLET | Freq: Every day | ORAL | Status: DC
  Administered 2022-06-09 – 2022-06-17 (×8): 20 mg via ORAL
  Filled 2022-06-09 (×9): qty 1

## 2022-06-09 MED ORDER — METFORMIN HCL 500 MG PO TABS
500.0000 mg | ORAL_TABLET | Freq: Every day | ORAL | Status: DC
  Administered 2022-06-10 – 2022-06-17 (×8): 500 mg via ORAL
  Filled 2022-06-09 (×8): qty 1

## 2022-06-09 MED ORDER — PANTOPRAZOLE SODIUM 40 MG PO TBEC
40.0000 mg | DELAYED_RELEASE_TABLET | Freq: Every day | ORAL | Status: DC
  Administered 2022-06-09 – 2022-06-17 (×9): 40 mg via ORAL
  Filled 2022-06-09 (×9): qty 1

## 2022-06-09 MED ORDER — LISINOPRIL-HYDROCHLOROTHIAZIDE 20-25 MG PO TABS: 1.0000 | ORAL_TABLET | Freq: Every day | ORAL | Status: DC

## 2022-06-09 MED ORDER — AMLODIPINE BESYLATE 10 MG PO TABS
10.0000 mg | ORAL_TABLET | Freq: Every day | ORAL | Status: DC
  Administered 2022-06-09 – 2022-06-17 (×8): 10 mg via ORAL
  Filled 2022-06-09 (×9): qty 1

## 2022-06-09 MED ORDER — HYDROCHLOROTHIAZIDE 25 MG PO TABS
25.0000 mg | ORAL_TABLET | Freq: Every day | ORAL | Status: DC
  Administered 2022-06-09 – 2022-06-17 (×9): 25 mg via ORAL
  Filled 2022-06-09 (×9): qty 1

## 2022-06-09 NOTE — Progress Notes (Signed)
2200 CBG done with 3 east CBG machine and didn't cross over into EPIC. CBG is 142.

## 2022-06-09 NOTE — Progress Notes (Signed)
Brief cardiology note:  Asked to consult regarding bradycardia. However, bradycardia has been documented by vital signs, which have ranged from 44-93. He has only just been placed on telemetry. ECG was just ordered, noted sinus rhythm with sinus arrhythmia at 78 bpm. She has not been hypotensive or had high risk symptoms based on chart review.  Given this, we do not have enough information to consult today. We will follow up her telemetry tomorrow and do formal consult at that time. Communicated with consulting physician Dr. Olevia Bowens. Please call cardiology with questions in the interim.  Buford Dresser, MD, PhD, [facility] at [facility] at [facility] [address], [place], [place] [address] [phone]

## 2022-06-09 NOTE — Anesthesia Postprocedure Evaluation (Signed)
Anesthesia Post Note  Patient: Kimberly Sellers  Procedure(s) Performed: COMPUTER ASSISTED TOTAL KNEE ARTHROPLASTY (Left: Knee)     Patient location during evaluation: PACU Anesthesia Type: Regional and General Level of consciousness: awake and alert Pain management: pain level controlled Vital Signs Assessment: post-procedure vital signs reviewed and stable Respiratory status: spontaneous breathing, nonlabored ventilation and respiratory function stable Cardiovascular status: blood pressure returned to baseline and stable Postop Assessment: no apparent nausea or vomiting Anesthetic complications: no   No notable events documented.  Last Vitals:  Vitals:   06/08/22 2033 06/08/22 2034  BP: 134/60   Pulse: (!) 50   Resp: 16   Temp:  (!) 36.3 C  SpO2: 100%     Last Pain:  Vitals:   06/08/22 2242  TempSrc:   PainSc: 7                  Advay Volante

## 2022-06-09 NOTE — Progress Notes (Signed)
Physical Therapy Treatment Patient Details Name: Kimberly Sellers MRN: [id number] DOB: [birth year] Today's Date: 06/09/2022   History of Present Illness Pt is a 77 yo female presenting s/p L-TKA on 06/08/22. PMH: DM, GERD, HTN    PT Comments    Pt requiring increased time due to dizziness.  Pt reports she does not feel faint and denies hx of vertigo.  Pt ambulated as tolerated within her room and then assisted back to bed.  Telemetry monitor reading 80 bpm end of ambulation.  BP 155/65 mmHg and HR 68 upon return to sitting position after ambulating (with dynamap).  Pt does not yet feel ready for d/c home and will also need to practice steps prior to d/c.   Recommendations for follow up therapy are one component of a multi-disciplinary discharge planning process, led by the attending physician.  Recommendations may be updated based on patient status, additional functional criteria and insurance authorization.  Follow Up Recommendations  Follow physician's recommendations for discharge plan and follow up therapies     Assistance Recommended at Discharge Frequent or constant Supervision/Assistance  Patient can return home with the following A little help with walking and/or transfers;A little help with bathing/dressing/bathroom;Assistance with cooking/housework;Help with stairs or ramp for entrance;Assist for transportation   Equipment Recommendations  None recommended by PT    Recommendations for Other Services       Precautions / Restrictions Precautions Precautions: Fall;Knee Restrictions Other Position/Activity Restrictions: WBAT     Mobility  Bed Mobility Overal bed mobility: Needs Assistance Bed Mobility: Supine to Sit, Sit to Supine     Supine to sit: Supervision Sit to supine: Supervision   General bed mobility comments: increased time    Transfers Overall transfer level: Needs assistance Equipment used: Rolling walker (2 wheels) Transfers: Sit to/from Stand Sit  to Stand: Min assist           General transfer comment: verbal cues for UE and LE positioning; assist to rise and steady    Ambulation/Gait Ambulation/Gait assistance: Min guard Gait Distance (Feet): 40 Feet Assistive device: Rolling walker (2 wheels) Gait Pattern/deviations: Step-to pattern, Antalgic, Decreased stance time - left Gait velocity: decreased     General Gait Details: verbal cues for sequence, RW positioning, step length, distance to tolerance; pt reports dizziness limiting   Stairs             Wheelchair Mobility    Modified Rankin (Stroke Patients Only)       Balance                                            Cognition Arousal/Alertness: Awake/alert Behavior During Therapy: WFL for tasks assessed/performed Overall Cognitive Status: Within Functional Limits for tasks assessed                                          Exercises Total Joint Exercises Ankle Circles/Pumps: AROM, Both, 10 reps Quad Sets: AROM, Left, 10 reps Heel Slides: AAROM, Left, 10 reps Hip ABduction/ADduction: AAROM, Left, 10 reps Straight Leg Raises: AAROM, Left, 10 reps    General Comments        Pertinent Vitals/Pain Pain Assessment Pain Assessment: 0-10 Pain Score: 5  Pain Location: left knee Pain Descriptors / Indicators: Operative site guarding, Aching, Sore  Pain Intervention(s): Monitored during session, Repositioned    Home Living                          Prior Function            PT Goals (current goals can now be found in the care plan section) Progress towards PT goals: Progressing toward goals    Frequency    7X/week      PT Plan Current plan remains appropriate    Co-evaluation              AM-PAC PT "6 Clicks" Mobility   Outcome Measure  Help needed turning from your back to your side while in a flat bed without using bedrails?: A Little Help needed moving from lying on your back  to sitting on the side of a flat bed without using bedrails?: A Little Help needed moving to and from a bed to a chair (including a wheelchair)?: A Little Help needed standing up from a chair using your arms (e.g., wheelchair or bedside chair)?: A Little Help needed to walk in hospital room?: A Little Help needed climbing 3-5 steps with a railing? : A Lot 6 Click Score: 17    End of Session Equipment Utilized During Treatment: Gait belt Activity Tolerance: Patient tolerated treatment well Patient left: in bed;with call bell/phone within reach;with family/visitor present Nurse Communication: Mobility status PT Visit Diagnosis: Difficulty in walking, not elsewhere classified (R26.2) Pain - Right/Left: Left Pain - part of body: Knee     Time: 1426-1450 PT Time Calculation (min) (ACUTE ONLY): 24 min  Charges:  $Gait Training: 23-37 mins                     Jannette Spanner PT, DPT Physical Therapist Acute Rehabilitation Services Preferred contact method: Secure Chat Weekend Pager Only: [phone] Office: [phone]   Myrtis Hopping [place] 06/09/2022, 3:51 PM

## 2022-06-09 NOTE — Progress Notes (Signed)
Physical Therapy Treatment Patient Details Name: Kimberly Sellers MRN: [id number] DOB: 1946/03/16 Today's Date: 06/09/2022   History of Present Illness Pt is a 77 yo female presenting s/p L-TKA on 06/08/22. PMH: DM, GERD, HTN,    PT Comments    Pt assisted with ambulating short distance and then performed LE exercises with sitting in recliner.  Pt will need to practice steps prior to d/c.   Recommendations for follow up therapy are one component of a multi-disciplinary discharge planning process, led by the attending physician.  Recommendations may be updated based on patient status, additional functional criteria and insurance authorization.  Follow Up Recommendations  Follow physician's recommendations for discharge plan and follow up therapies     Assistance Recommended at Discharge Frequent or constant Supervision/Assistance  Patient can return home with the following A little help with walking and/or transfers;A little help with bathing/dressing/bathroom;Assistance with cooking/housework;Help with stairs or ramp for entrance;Assist for transportation   Equipment Recommendations  None recommended by PT    Recommendations for Other Services       Precautions / Restrictions Precautions Precautions: Fall;Knee Restrictions Weight Bearing Restrictions: No Other Position/Activity Restrictions: WBAT     Mobility  Bed Mobility Overal bed mobility: Needs Assistance Bed Mobility: Supine to Sit     Supine to sit: Min guard     General bed mobility comments: verbal cues for self assist    Transfers Overall transfer level: Needs assistance Equipment used: Rolling walker (2 wheels) Transfers: Sit to/from Stand Sit to Stand: Min assist           General transfer comment: verbal cues for UE and LE positioning; assist to rise and steady    Ambulation/Gait Ambulation/Gait assistance: Min guard Gait Distance (Feet): 34 Feet Assistive device: Rolling walker (2  wheels) Gait Pattern/deviations: Step-to pattern, Antalgic, Decreased stance time - left Gait velocity: decreased     General Gait Details: verbal cues for sequence, RW positioning, step length, distance to tolerance   Stairs             Wheelchair Mobility    Modified Rankin (Stroke Patients Only)       Balance                                            Cognition Arousal/Alertness: Awake/alert Behavior During Therapy: WFL for tasks assessed/performed Overall Cognitive Status: Within Functional Limits for tasks assessed                                          Exercises Total Joint Exercises Ankle Circles/Pumps: AROM, Both, 10 reps Quad Sets: AROM, Left, 10 reps Heel Slides: AAROM, Left, 10 reps Hip ABduction/ADduction: AAROM, Left, 10 reps Straight Leg Raises: AAROM, Left, 10 reps    General Comments        Pertinent Vitals/Pain Pain Assessment Pain Assessment: 0-10 Pain Score: 6  Pain Location: left knee and headache Pain Descriptors / Indicators: Operative site guarding, Aching, Sore Pain Intervention(s): Monitored during session, Repositioned, Premedicated before session    Home Living                          Prior Function            PT  Goals (current goals can now be found in the care plan section) Progress towards PT goals: Progressing toward goals    Frequency    7X/week      PT Plan Current plan remains appropriate    Co-evaluation              AM-PAC PT "6 Clicks" Mobility   Outcome Measure  Help needed turning from your back to your side while in a flat bed without using bedrails?: A Little Help needed moving from lying on your back to sitting on the side of a flat bed without using bedrails?: A Little Help needed moving to and from a bed to a chair (including a wheelchair)?: A Little Help needed standing up from a chair using your arms (e.g., wheelchair or bedside chair)?:  A Little Help needed to walk in hospital room?: A Little Help needed climbing 3-5 steps with a railing? : A Lot 6 Click Score: 17    End of Session Equipment Utilized During Treatment: Gait belt Activity Tolerance: Patient tolerated treatment well Patient left: in chair;with call bell/phone within reach;with chair alarm set;with family/visitor present Nurse Communication: Mobility status PT Visit Diagnosis: Pain;Difficulty in walking, not elsewhere classified (R26.2) Pain - Right/Left: Left Pain - part of body: Knee     Time: 2683-4196 PT Time Calculation (min) (ACUTE ONLY): 19 min  Charges:  $Gait Training: 8-22 mins                     Arlyce Dice, DPT Physical Therapist Acute Rehabilitation Services Preferred contact method: Secure Chat Weekend Pager Only: [phone] Office: [place] 06/09/2022, 1:01 PM

## 2022-06-09 NOTE — TOC Transition Note (Signed)
Transition of Care [facility]) - CM/SW Discharge Note   Patient Details  Name: Kimberly Sellers MRN: [id number] Date of Birth: Sep 10, 1945  [facility]) CM/SW Contact:  Lennart Pall, LCSW Phone Number: 06/09/2022, 9:26 AM   Clinical Narrative:     Met with pt and family and confirming she already has a RW at home that was loaned to her.  OPPT already arranged at [facility].  No TOC needs.  Final next level of care: OP Rehab Barriers to Discharge: No Barriers Identified   Patient Goals and CMS Choice      Discharge Placement                         Discharge Plan and Services Additional resources added to the After Visit Summary for                  DME Arranged: N/A DME Agency: NA                  Social Determinants of Health (SDOH) Interventions SDOH Screenings   Food Insecurity: No Food Insecurity (06/08/2022)  Housing: Low Risk  (06/08/2022)  Transportation Needs: No Transportation Needs (06/08/2022)  Utilities: Not At Risk (06/08/2022)  Tobacco Use: Medium Risk (06/08/2022)     Readmission Risk Interventions     No data to display

## 2022-06-09 NOTE — Progress Notes (Addendum)
    Subjective:  Patient reports pain as mild to moderate.  Denies N/V/CP/SOB/Abd/ Dizziness. She reports that she didn't get much rest last night. She reports her pain is controlled with the medication.   Objective:   VITALS:   Vitals:   06/08/22 2033 06/08/22 2034 06/09/22 0124 06/09/22 0535  BP: 134/60  (!) 136/55 (!) 134/58  Pulse: (!) 50  (!) 49 (!) 44  Resp: 16  18 18   Temp:  (!) 97.4 F (36.3 C) 98.1 F (36.7 C) 98.4 F (36.9 C)  TempSrc:  Oral Oral Oral  SpO2: 100%  99% 99%  Weight:      Height:        NAD Neurologically intact ABD soft Neurovascular intact Sensation intact distally Intact pulses distally Dorsiflexion/Plantar flexion intact Incision: dressing C/D/I No cellulitis present Compartment soft   Lab Results  Component Value Date   WBC 11.7 (H) 06/09/2022   HGB 9.3 (L) 06/09/2022   HCT 29.5 (L) 06/09/2022   MCV 83.3 06/09/2022   PLT 254 06/09/2022   BMET    Component Value Date/Time   NA 141 06/09/2022 0333   K 3.9 06/09/2022 0333   CL 112 (H) 06/09/2022 0333   CO2 24 06/09/2022 0333   GLUCOSE 112 (H) 06/09/2022 0333   BUN 14 06/09/2022 0333   CREATININE 0.64 06/09/2022 0333   CALCIUM 8.0 (L) 06/09/2022 0333   GFRNONAA >60 06/09/2022 0333     Assessment/Plan: 1 Day Post-Op   Principal Problem:   Osteoarthritis of left knee   WBAT with walker DVT ppx: Aspirin, SCDs, TEDS PO pain control PT/OT: Patient ambulated 10 feet with PT yesterday.  Dispo: Patient states she is feeling well this morning. She has had some bradycardia in the 40s-50s since surgery yesterday. Consult placed to [facility], appreciate their help. From an orthopedic stand point patient can discharge home once cleared with PT and TRH. Medication has been sent to the pharmacy.    Charlott Rakes, PA-C 06/09/2022, 10:54 AM   [facility]  [place] [address], [address], [place],  [address] Phone: [phone] [email or web address] Facebook   [employer]

## 2022-06-09 NOTE — Consult Note (Addendum)
Initial Consultation Note   Patient: Kimberly Sellers EUM:[id number] DOB: Oct 08, 1945 PCP: Cipriano Mile, NP DOA: 06/08/2022 DOS: the patient was seen and examined on 06/09/2022 Primary service: Rod Can, MD  Referring physician: Rod Can MD. Reason for consult: Bradycardia.  Assessment/Plan: Principal Problem:   Osteoarthritis of left knee Continue postop team per primary team.  Active Problems:   Sinus bradycardia Medication related? Vasovagal? The patient has been nauseous while bradycardic. Combination of issues above. Begin cardiac telemetry. Avoid negative chronotropic medications. Obtain EKG and echocardiogram. Cardiology will wait to have more telemetry data.    HTN (hypertension) As needed antihypertensive. Resume home amlodipine, lisinopril and HCTZ. Avoid beta-blockers and nondihydropyridine CCB.    Normocytic anemia Monitor hematocrit and hemoglobin. Transfuse as needed.   TRH will continue to follow the patient.  HPI: Kimberly Sellers is a 77 y.o. female with past medical history of anxiety, osteoarthritis, type 2 diabetes, dyspnea with exertion, GERD, hypertension, nausea and vomiting, adrenal nodule, partial small bowel obstruction, history of supraventricular tachycardia who we are being consulted for evaluation of bradycardia.  The patient stated she has been nauseous and mildly lightheaded.  She has also had a mild headache which is better now.  No fever, chills or night sweats. No sore throat, rhinorrhea, dyspnea, wheezing or hemoptysis.  No chest pain, palpitations, diaphoresis, PND, orthopnea or pitting edema of the lower extremities.  No appetite changes, abdominal pain, diarrhea, constipation, melena or hematochezia.  No flank pain, dysuria, frequency or hematuria.  No polyuria, polydipsia, polyphagia or blurred vision.  Review of Systems: As mentioned in the history of present illness. All other systems reviewed and are negative. Past Medical  History:  Diagnosis Date   Anxiety    Arthritis    Diabetes mellitus without complication ([place])    Dyspnea    with exertion   GERD (gastroesophageal reflux disease)    Hypertension    Nausea & vomiting 06/01/2012   Past Surgical History:  Procedure Laterality Date   ABDOMINAL HYSTERECTOMY     CHOLECYSTECTOMY     Social History:  reports that she has quit smoking. Her smoking use included cigarettes. She has never used smokeless tobacco. She reports that she does not drink alcohol and does not use drugs.  No Known Allergies  Family History  Problem Relation Age of Onset   Breast cancer Cousin        54s    Prior to Admission medications   Medication Sig Start Date End Date Taking? Authorizing Provider  amLODipine (NORVASC) 10 MG tablet Take 10 mg by mouth daily.   Yes [provider]  aspirin (ASPIRIN CHILDRENS) 81 MG chewable tablet Chew 1 tablet (81 mg total) by mouth 2 (two) times daily with a meal. 06/08/22 07/23/22 Yes Swinteck, Aaron Edelman, MD  aspirin EC 81 MG tablet Take 81 mg by mouth daily.   Yes [provider]  Cholecalciferol (VITAMIN D3) 2000 units capsule Take 2,000 Units by mouth daily.   Yes [provider]  cycloSPORINE (RESTASIS) 0.05 % ophthalmic emulsion Place 1 drop into both eyes 2 (two) times daily as needed (dry eyes).   Yes [provider]  docusate sodium (COLACE) 100 MG capsule Take 1 capsule (100 mg total) by mouth 2 (two) times daily. 06/08/22 08/07/22 Yes Swinteck, Aaron Edelman, MD  lisinopril-hydrochlorothiazide (PRINZIDE,ZESTORETIC) 20-25 MG tablet Take 1 tablet by mouth daily.   Yes [provider]  loratadine (CLARITIN) 10 MG tablet Take 10 mg by mouth daily as needed for  allergies.   Yes [provider]  Menthol, Topical Analgesic, (ICY HOT EX) Apply 1 Application topically daily as needed (pain).   Yes [provider]  metFORMIN (GLUCOPHAGE) 500 MG tablet Take 500 mg by mouth daily. 12/02/19  Yes  [provider]  Multiple Vitamin (MULTIVITAMIN PO) Take 1 tablet by mouth daily.   Yes [provider]  ondansetron (ZOFRAN) 4 MG tablet Take 1 tablet (4 mg total) by mouth every 8 (eight) hours as needed for nausea or vomiting. 06/08/22  Yes Swinteck, Arlys John, MD  oxyCODONE (ROXICODONE) 5 MG immediate release tablet Take 1 tablet (5 mg total) by mouth every 4 (four) hours as needed for up to 7 days for severe pain. 06/08/22 06/15/22 Yes Swinteck, Arlys John, MD  pantoprazole (PROTONIX) 40 MG tablet Take 40 mg by mouth daily. 01/28/21  Yes [provider]  polyethylene glycol (MIRALAX) 17 g packet Take 17 g by mouth daily as needed for moderate constipation or severe constipation. 06/08/22  Yes Swinteck, Arlys John, MD  senna (SENOKOT) 8.6 MG TABS tablet Take 2 tablets (17.2 mg total) by mouth at bedtime. 06/08/22 08/07/22 Yes Swinteck, Arlys John, MD  acetaminophen (TYLENOL) 500 MG tablet Take 2 tablets (1,000 mg total) by mouth every 8 (eight) hours as needed for moderate pain. 06/08/22 07/08/22  Swinteck, Arlys John, MD  meloxicam (MOBIC) 7.5 MG tablet Take 1 tablet (7.5 mg total) by mouth daily as needed for pain. 06/08/22 10/06/22  Samson Frederic, MD    Physical Exam: Vitals:   06/08/22 2033 06/08/22 2034 06/09/22 0124 06/09/22 0535  BP: 134/60  (!) 136/55 (!) 134/58  Pulse: (!) 50  (!) 49 (!) 44  Resp: 16  18 18   Temp:  (!) 97.4 F (36.3 C) 98.1 F (36.7 C) 98.4 F (36.9 C)  TempSrc:  Oral Oral Oral  SpO2: 100%  99% 99%  Weight:      Height:       Physical Exam Vitals and nursing note reviewed.  Constitutional:      General: She is awake. She is not in acute distress.    Appearance: Normal appearance.  HENT:     Head: Normocephalic.     Mouth/Throat:     Mouth: Mucous membranes are moist.  Eyes:     General: No scleral icterus.    Pupils: Pupils are equal, round, and reactive to light.  Neck:     Vascular: No JVD.  Cardiovascular:     Rate and Rhythm: Regular rhythm.  Bradycardia present.     Heart sounds: S1 normal and S2 normal.  Pulmonary:     Effort: Pulmonary effort is normal. No respiratory distress.     Breath sounds: Normal breath sounds. No wheezing, rhonchi or rales.  Abdominal:     General: Bowel sounds are normal. There is no distension.     Palpations: Abdomen is soft.     Tenderness: There is no abdominal tenderness. There is no guarding.  Musculoskeletal:     Cervical back: Neck supple.     Right lower leg: No edema.     Left lower leg: No edema.     Comments: Left knee dressing in place.  Skin:    General: Skin is warm and dry.  Neurological:     General: No focal deficit present.     Mental Status: She is alert and oriented to person, place, and time.  Psychiatric:        Mood and Affect: Mood normal.  Behavior: Behavior normal. Behavior is cooperative.     Data Reviewed:   There are no new results to review at this time.   Family Communication:  Primary team communication:  Thank you very much for involving [place] in the care of your patient.  Author: Reubin Milan, MD 06/09/2022 10:53 AM  For on call review [email or web address].   This document was prepared using Dragon voice recognition software and may contain some unintended transcription errors.

## 2022-06-10 ENCOUNTER — Other Ambulatory Visit (HOSPITAL_COMMUNITY): Payer: 59

## 2022-06-10 DIAGNOSIS — Z7984 Long term (current) use of oral hypoglycemic drugs: Secondary | ICD-10-CM | POA: Diagnosis not present

## 2022-06-10 DIAGNOSIS — I159 Secondary hypertension, unspecified: Secondary | ICD-10-CM

## 2022-06-10 DIAGNOSIS — I10 Essential (primary) hypertension: Secondary | ICD-10-CM

## 2022-06-10 DIAGNOSIS — R519 Headache, unspecified: Secondary | ICD-10-CM | POA: Diagnosis not present

## 2022-06-10 DIAGNOSIS — Z7982 Long term (current) use of aspirin: Secondary | ICD-10-CM | POA: Diagnosis not present

## 2022-06-10 DIAGNOSIS — M1712 Unilateral primary osteoarthritis, left knee: Secondary | ICD-10-CM | POA: Diagnosis not present

## 2022-06-10 DIAGNOSIS — F419 Anxiety disorder, unspecified: Secondary | ICD-10-CM | POA: Diagnosis not present

## 2022-06-10 DIAGNOSIS — R42 Dizziness and giddiness: Secondary | ICD-10-CM | POA: Diagnosis not present

## 2022-06-10 DIAGNOSIS — E876 Hypokalemia: Secondary | ICD-10-CM | POA: Diagnosis not present

## 2022-06-10 DIAGNOSIS — Z803 Family history of malignant neoplasm of breast: Secondary | ICD-10-CM | POA: Diagnosis not present

## 2022-06-10 DIAGNOSIS — Z90710 Acquired absence of both cervix and uterus: Secondary | ICD-10-CM | POA: Diagnosis not present

## 2022-06-10 DIAGNOSIS — D649 Anemia, unspecified: Secondary | ICD-10-CM | POA: Diagnosis not present

## 2022-06-10 DIAGNOSIS — H9319 Tinnitus, unspecified ear: Secondary | ICD-10-CM | POA: Diagnosis present

## 2022-06-10 DIAGNOSIS — E119 Type 2 diabetes mellitus without complications: Secondary | ICD-10-CM | POA: Diagnosis not present

## 2022-06-10 DIAGNOSIS — E278 Other specified disorders of adrenal gland: Secondary | ICD-10-CM | POA: Diagnosis not present

## 2022-06-10 DIAGNOSIS — Z9049 Acquired absence of other specified parts of digestive tract: Secondary | ICD-10-CM | POA: Diagnosis not present

## 2022-06-10 DIAGNOSIS — E869 Volume depletion, unspecified: Secondary | ICD-10-CM | POA: Diagnosis not present

## 2022-06-10 DIAGNOSIS — K219 Gastro-esophageal reflux disease without esophagitis: Secondary | ICD-10-CM | POA: Diagnosis not present

## 2022-06-10 DIAGNOSIS — I358 Other nonrheumatic aortic valve disorders: Secondary | ICD-10-CM | POA: Diagnosis not present

## 2022-06-10 DIAGNOSIS — Z79899 Other long term (current) drug therapy: Secondary | ICD-10-CM | POA: Diagnosis not present

## 2022-06-10 DIAGNOSIS — Z791 Long term (current) use of non-steroidal anti-inflammatories (NSAID): Secondary | ICD-10-CM | POA: Diagnosis not present

## 2022-06-10 DIAGNOSIS — R9431 Abnormal electrocardiogram [ECG] [EKG]: Secondary | ICD-10-CM | POA: Diagnosis not present

## 2022-06-10 DIAGNOSIS — I493 Ventricular premature depolarization: Secondary | ICD-10-CM | POA: Diagnosis not present

## 2022-06-10 DIAGNOSIS — Z87891 Personal history of nicotine dependence: Secondary | ICD-10-CM | POA: Diagnosis not present

## 2022-06-10 DIAGNOSIS — R001 Bradycardia, unspecified: Secondary | ICD-10-CM | POA: Diagnosis not present

## 2022-06-10 LAB — CBC
HCT: 30.1 % — ABNORMAL LOW (ref 36.0–46.0)
Hemoglobin: 9.7 g/dL — ABNORMAL LOW (ref 12.0–15.0)
MCH: 26.1 pg (ref 26.0–34.0)
MCHC: 32.2 g/dL (ref 30.0–36.0)
MCV: 80.9 fL (ref 80.0–100.0)
Platelets: 260 10*3/uL (ref 150–400)
RBC: 3.72 MIL/uL — ABNORMAL LOW (ref 3.87–5.11)
RDW: 16.1 % — ABNORMAL HIGH (ref 11.5–15.5)
WBC: 10.6 10*3/uL — ABNORMAL HIGH (ref 4.0–10.5)
nRBC: 0 % (ref 0.0–0.2)

## 2022-06-10 LAB — GLUCOSE, CAPILLARY
Glucose-Capillary: 102 mg/dL — ABNORMAL HIGH (ref 70–99)
Glucose-Capillary: 158 mg/dL — ABNORMAL HIGH (ref 70–99)
Glucose-Capillary: 135 mg/dL — ABNORMAL HIGH (ref 70–99)
Glucose-Capillary: 135 mg/dL — ABNORMAL HIGH (ref 70–99)

## 2022-06-10 NOTE — Progress Notes (Signed)
PROGRESS NOTE    MATHILDE MCWHERTER  RDE:[id number] DOB: Nov 15, 1945 DOA: 06/08/2022 PCP: Cipriano Mile, NP    Brief Narrative:   Kimberly Sellers is a 77 y.o. female with past medical history of anxiety, osteoarthritis, type 2 diabetes, GERD, hypertension,  adrenal nodule, partial small bowel obstruction, history of supraventricular tachycardia was consulted evaluation of bradycardia nausea and lightheadedness.  Patient was admitted by orthopedics team for osteoarthritis on the left knee and underwent computer-assisted left total knee arthroplasty on 06/08/2022.    Sinus bradycardia Question vasovagal patient was already loaded bradycardia Continue telemetry.  Check 2D echocardiogram.  Cardiology was consulted.    HTN (hypertension) Continue amlodipine, lisinopril and HCTZ.  Hold off  beta-blockers and nondihydropyridine CCB.     Normocytic anemia Latest hemoglobin 9.7.      DVT prophylaxis: SCDs Start: 06/08/22 1541   Code Status:     Code Status: Full Code  Disposition: Likely home as per primary team.  Status is: Observation   Family Communication: None at bedside   Consultants:  Cardiology  Procedures:  2D echocardiogram  Antimicrobials:  None  Subjective: Today, patient was seen and examined at bedside.  Planes of nausea and had received nausea medicine.  Complains of knee pain.  Denies any chest pain shortness of breath or dizziness.  Objective: Vitals:   06/09/22 1310 06/09/22 1829 06/09/22 2225 06/10/22 0527  BP: (!) 160/84 (!) 128/58 128/63 (!) 149/67  Pulse: 72  88 76  Resp: 17  16 16   Temp: 98.9 F (37.2 C)  99.4 F (37.4 C) 99.8 F (37.7 C)  TempSrc:   Oral Oral  SpO2: 100%  98% 98%  Weight:      Height:        Intake/Output Summary (Last 24 hours) at 06/10/2022 0959 Last data filed at 06/10/2022 6314 Gross per 24 hour  Intake 1536.94 ml  Output 2200 ml  Net -663.06 ml   Filed Weights   06/08/22 0701  Weight: 81.6 kg    Physical  Examination: Body mass index is 28.19 kg/m.  General:  Average built, not in obvious distress HENT:   No scleral pallor or icterus noted. Oral mucosa is moist.  Chest:  Clear breath sounds.  Diminished breath sounds bilaterally. No crackles or wheezes.  CVS: S1 &S2 heard. No murmur.  Regular rate and rhythm. Abdomen: Soft, nontender, nondistended.  Bowel sounds are heard.   Extremities: Status post left knee total knee arthroplasty. Psych: Alert, awake and oriented, normal mood CNS:  No cranial nerve deficits.  Power equal in all extremities.   Skin: Warm and dry.  No rashes noted.  Data Reviewed:   CBC: Recent Labs  Lab 06/09/22 0333 06/10/22 0323  WBC 11.7* 10.6*  HGB 9.3* 9.7*  HCT 29.5* 30.1*  MCV 83.3 80.9  PLT 254 970    Basic Metabolic Panel: Recent Labs  Lab 06/09/22 0333 06/09/22 1149  NA 141  --   K 3.9  --   CL 112*  --   CO2 24  --   GLUCOSE 112*  --   BUN 14  --   CREATININE 0.64  --   CALCIUM 8.0*  --   MG  --  1.9    Liver Function Tests: No results for input(s): "AST", "ALT", "ALKPHOS", "BILITOT", "PROT", "ALBUMIN" in the last 168 hours.   Radiology Studies: DG Knee Left Port  Result Date: 06/08/2022 CLINICAL DATA:  Status post total left knee arthroplasty. Postoperative. EXAM: PORTABLE LEFT  KNEE - 1-2 VIEW COMPARISON:  Left knee radiographs 04/06/2020 FINDINGS: Status post total left knee arthroplasty. No perihardware lucency is seen to indicate hardware failure or loosening. Redemonstration of curvilinear ossific density overlying the proximal medial collateral ligament, just medial to the superior medial femoral condyle (chronic Pellegrini-Stieda lesion). Postoperative subcutaneous and intra-articular air are seen. No significant joint fluid. Anterior surgical skin staples. No acute fracture or dislocation. Mild vascular calcifications. IMPRESSION: Status post total left knee arthroplasty without evidence of hardware failure. Electronically Signed    By: Yvonne Kendall M.D.   On: 06/08/2022 12:18      LOS: 0 days    Flora Lipps, MD [facility] Available via Epic secure chat 7am-7pm After these hours, please refer to coverage provider listed on [email or web address] 06/10/2022, 9:59 AM

## 2022-06-10 NOTE — Progress Notes (Signed)
Physical Therapy Treatment Patient Details Name: Kimberly Sellers MRN: [id number] DOB: 07/13/1945 Today's Date: 06/10/2022   History of Present Illness Pt is a 77 yo female presenting s/p L-TKA on 06/08/22. PMH: DM, GERD, HTN    PT Comments    Pt assisted with ambulating and able to tolerate short distance only.  Pt reports dizziness is still a limiting factor however also reporting neck pain this afternoon and RN notified.  HR up to 135 bpm with ambulating in hallway.  Discussed goal for pt to ambulate and practice steps tomorrow in order to d/c home.       Recommendations for follow up therapy are one component of a multi-disciplinary discharge planning process, led by the attending physician.  Recommendations may be updated based on patient status, additional functional criteria and insurance authorization.  Follow Up Recommendations  Follow physician's recommendations for discharge plan and follow up therapies     Assistance Recommended at Discharge Frequent or constant Supervision/Assistance  Patient can return home with the following A little help with walking and/or transfers;A little help with bathing/dressing/bathroom;Assistance with cooking/housework;Help with stairs or ramp for entrance;Assist for transportation   Equipment Recommendations  None recommended by PT    Recommendations for Other Services       Precautions / Restrictions Precautions Precautions: Fall;Knee Restrictions LLE Weight Bearing: Weight bearing as tolerated Other Position/Activity Restrictions: WBAT     Mobility  Bed Mobility Overal bed mobility: Needs Assistance Bed Mobility: Supine to Sit, Sit to Supine     Supine to sit: Min guard Sit to supine: Min guard   General bed mobility comments: increased time, cues for self assist of Lt LE    Transfers Overall transfer level: Needs assistance Equipment used: Rolling walker (2 wheels) Transfers: Sit to/from Stand Sit to Stand: Min assist    Step pivot transfers: Min assist       General transfer comment: verbal cues for UE and LE positioning; assist to rise and steady    Ambulation/Gait Ambulation/Gait assistance: Min guard Gait Distance (Feet): 40 Feet Assistive device: Rolling walker (2 wheels) Gait Pattern/deviations: Step-to pattern, Antalgic, Decreased stance time - left Gait velocity: decreased     General Gait Details: verbal cues for sequence, RW positioning, step length, distance to tolerance; pt reports dizziness limiting; HR up to 135 bpm   Stairs             Wheelchair Mobility    Modified Rankin (Stroke Patients Only)       Balance                                            Cognition Arousal/Alertness: Awake/alert Behavior During Therapy: WFL for tasks assessed/performed Overall Cognitive Status: Within Functional Limits for tasks assessed                                          Exercises Total Joint Exercises Ankle Circles/Pumps: AROM, Both, 10 reps Quad Sets: AROM, Left, 10 reps Heel Slides: Left, 10 reps, Seated, AROM    General Comments        Pertinent Vitals/Pain Pain Assessment Pain Assessment: 0-10 Pain Score: 3  Pain Location: left knee (neck 8/10 RN notified) Pain Descriptors / Indicators: Operative site guarding, Aching, Sore Pain Intervention(s): Monitored  during session, Repositioned, Patient requesting pain meds-RN notified    Home Living                          Prior Function            PT Goals (current goals can now be found in the care plan section) Progress towards PT goals: Progressing toward goals    Frequency    7X/week      PT Plan Current plan remains appropriate    Co-evaluation              AM-PAC PT "6 Clicks" Mobility   Outcome Measure  Help needed turning from your back to your side while in a flat bed without using bedrails?: A Little Help needed moving from lying on  your back to sitting on the side of a flat bed without using bedrails?: A Little Help needed moving to and from a bed to a chair (including a wheelchair)?: A Little Help needed standing up from a chair using your arms (e.g., wheelchair or bedside chair)?: A Little Help needed to walk in hospital room?: A Lot Help needed climbing 3-5 steps with a railing? : A Lot 6 Click Score: 16    End of Session Equipment Utilized During Treatment: Gait belt Activity Tolerance: Patient limited by fatigue Patient left: in bed;with call bell/phone within reach;with family/visitor present Nurse Communication: Mobility status PT Visit Diagnosis: Difficulty in walking, not elsewhere classified (R26.2)     Time: 0347-4259 PT Time Calculation (min) (ACUTE ONLY): 21 min  Charges:  $Gait Training: 8-22 mins                      Arlyce Dice, DPT Physical Therapist Acute Rehabilitation Services Preferred contact method: Secure Chat Weekend Pager Only: [phone] Office: [place] 06/10/2022, 3:59 PM

## 2022-06-10 NOTE — Progress Notes (Signed)
    Subjective:  Patient reports pain as better controlled. She denies any tingling or numbness in LE bilaterally.   Objective:   VITALS:   Vitals:   06/09/22 1829 06/09/22 2225 06/10/22 0527 06/10/22 1320  BP: (!) 128/58 128/63 (!) 149/67 134/62  Pulse:  88 76 95  Resp:  16 16 17   Temp:  99.4 F (37.4 C) 99.8 F (37.7 C) 99.2 F (37.3 C)  TempSrc:  Oral Oral   SpO2:  98% 98% 99%  Weight:      Height:        Patient lying in bed. NAD. Neurologically intact ABD soft Neurovascular intact Sensation intact distally Intact pulses distally Dorsiflexion/Plantar flexion intact Incision: dressing C/D/I No cellulitis present Compartment soft   Lab Results  Component Value Date   WBC 10.6 (H) 06/10/2022   HGB 9.7 (L) 06/10/2022   HCT 30.1 (L) 06/10/2022   MCV 80.9 06/10/2022   PLT 260 06/10/2022   BMET    Component Value Date/Time   NA 141 06/09/2022 0333   K 3.9 06/09/2022 0333   CL 112 (H) 06/09/2022 0333   CO2 24 06/09/2022 0333   GLUCOSE 112 (H) 06/09/2022 0333   BUN 14 06/09/2022 0333   CREATININE 0.64 06/09/2022 0333   CALCIUM 8.0 (L) 06/09/2022 0333   GFRNONAA >60 06/09/2022 0333     Assessment/Plan: 2 Days Post-Op   Principal Problem:   Osteoarthritis of left knee Active Problems:   HTN (hypertension)   Sinus bradycardia   Normocytic anemia    WBAT with walker DVT ppx: Aspirin, SCDs, TEDS PO pain control PT/OT: She continues to do well with PT.  Dispo:  - Consult placed to [facility] due to bradycardia and reported dizziness, appreciate their help. Cardiology to consult per notes.  - From an orthopedic stand point patient can discharge home once cleared with PT and [facility].Continue incentive spirometry use 10x/hour. Medication has been sent to the pharmacy.     Armond Hang, MD Orthopaedic Surgery [facility]

## 2022-06-10 NOTE — Progress Notes (Signed)
    Subjective:  Patient reports pain as moderate. Patient states she has been feeling a little bit dizzy and nauseous since yesterday. Currently shd denies CP/SOB. She does report feeling nauseated. We discussed she has nausea medication ordered and she just needs to let her nurse know. We discussed continued use of incentive spirometry 10x/hour. She denies any tingling or numbness in LE bilaterally.   Objective:   VITALS:   Vitals:   06/09/22 1310 06/09/22 1829 06/09/22 2225 06/10/22 0527  BP: (!) 160/84 (!) 128/58 128/63 (!) 149/67  Pulse: 72  88 76  Resp: 17  16 16   Temp: 98.9 F (37.2 C)  99.4 F (37.4 C) 99.8 F (37.7 C)  TempSrc:   Oral Oral  SpO2: 100%  98% 98%  Weight:      Height:        Patient lying in bed. NAD. Neurologically intact ABD soft Neurovascular intact Sensation intact distally Intact pulses distally Dorsiflexion/Plantar flexion intact Incision: dressing C/D/I No cellulitis present Compartment soft   Lab Results  Component Value Date   WBC 10.6 (H) 06/10/2022   HGB 9.7 (L) 06/10/2022   HCT 30.1 (L) 06/10/2022   MCV 80.9 06/10/2022   PLT 260 06/10/2022   BMET    Component Value Date/Time   NA 141 06/09/2022 0333   K 3.9 06/09/2022 0333   CL 112 (H) 06/09/2022 0333   CO2 24 06/09/2022 0333   GLUCOSE 112 (H) 06/09/2022 0333   BUN 14 06/09/2022 0333   CREATININE 0.64 06/09/2022 0333   CALCIUM 8.0 (L) 06/09/2022 0333   GFRNONAA >60 06/09/2022 0333     Assessment/Plan: 2 Days Post-Op   Principal Problem:   Osteoarthritis of left knee Active Problems:   HTN (hypertension)   Sinus bradycardia   Normocytic anemia    WBAT with walker DVT ppx: Aspirin, SCDs, TEDS PO pain control PT/OT: She ambulated 40 feet with PT yesterday. Continue PT.  Dispo:  - Consult placed to [facility] yesterday due to bradycardia and reported dizziness, appreciate their help. Cardiology to consult today per notes.  - From an orthopedic stand point patient can  discharge home once cleared with PT and [facility].Continue incentive spirometry use 10x/hour. Medication has been sent to the pharmacy.     Charlott Rakes, PA-C 06/10/2022, 10:00 AM   [facility]  [place] [address], [address], [place], [place] [address] Phone: [phone] [email or web address] Facebook  [employer]

## 2022-06-10 NOTE — Hospital Course (Addendum)
Kimberly Sellers is a 77 y.o. female with past medical history of anxiety, osteoarthritis, type 2 diabetes, GERD, hypertension,  adrenal nodule, partial small bowel obstruction, history of supraventricular tachycardia was consulted for evaluation of bradycardia nausea and lightheadedness.  Patient was admitted by orthopedics team for osteoarthritis on the left knee and underwent computer-assisted left total knee arthroplasty on 06/08/2022.

## 2022-06-10 NOTE — Progress Notes (Signed)
Physical Therapy Treatment Patient Details Name: Kimberly Sellers MRN: [id number] DOB: 27-Apr-1946 Today's Date: 06/10/2022   History of Present Illness Pt is a 77 yo female presenting s/p L-TKA on 06/08/22. PMH: DM, GERD, HTN    PT Comments    Pt reports not feeling well but agreeable to attempt mobility.  Once pt assisted to stand, she reported she felt unable to tolerate ambulating and requested instead to transfer to recliner.  Pt states dizziness is her most limiting factor.  HR 135 bpm per telemetry upon sitting in recliner and RN notified.    Recommendations for follow up therapy are one component of a multi-disciplinary discharge planning process, led by the attending physician.  Recommendations may be updated based on patient status, additional functional criteria and insurance authorization.  Follow Up Recommendations  Follow physician's recommendations for discharge plan and follow up therapies     Assistance Recommended at Discharge Frequent or constant Supervision/Assistance  Patient can return home with the following A little help with walking and/or transfers;A little help with bathing/dressing/bathroom;Assistance with cooking/housework;Help with stairs or ramp for entrance;Assist for transportation   Equipment Recommendations  None recommended by PT    Recommendations for Other Services       Precautions / Restrictions Precautions Precautions: Fall;Knee Restrictions Weight Bearing Restrictions: No LLE Weight Bearing: Weight bearing as tolerated Other Position/Activity Restrictions: WBAT     Mobility  Bed Mobility Overal bed mobility: Needs Assistance Bed Mobility: Supine to Sit     Supine to sit: Min assist     General bed mobility comments: increased time, assist required for Lt LE    Transfers Overall transfer level: Needs assistance Equipment used: Rolling walker (2 wheels) Transfers: Sit to/from Stand, Bed to chair/wheelchair/BSC Sit to Stand: Min  assist   Step pivot transfers: Min assist       General transfer comment: verbal cues for UE and LE positioning; assist to rise and steady    Ambulation/Gait                   Stairs             Wheelchair Mobility    Modified Rankin (Stroke Patients Only)       Balance                                            Cognition Arousal/Alertness: Awake/alert Behavior During Therapy: WFL for tasks assessed/performed Overall Cognitive Status: Within Functional Limits for tasks assessed                                          Exercises      General Comments        Pertinent Vitals/Pain Pain Assessment Pain Assessment: 0-10 Pain Score: 3  Pain Location: left knee Pain Descriptors / Indicators: Operative site guarding, Aching, Sore Pain Intervention(s): Premedicated before session, Monitored during session, Repositioned    Home Living                          Prior Function            PT Goals (current goals can now be found in the care plan section) Progress towards PT goals: Progressing toward goals  Frequency    7X/week      PT Plan Current plan remains appropriate    Co-evaluation              AM-PAC PT "6 Clicks" Mobility   Outcome Measure  Help needed turning from your back to your side while in a flat bed without using bedrails?: A Little Help needed moving from lying on your back to sitting on the side of a flat bed without using bedrails?: A Little Help needed moving to and from a bed to a chair (including a wheelchair)?: A Little Help needed standing up from a chair using your arms (e.g., wheelchair or bedside chair)?: A Lot Help needed to walk in hospital room?: A Lot Help needed climbing 3-5 steps with a railing? : A Lot 6 Click Score: 15    End of Session Equipment Utilized During Treatment: Gait belt Activity Tolerance: Patient limited by fatigue Patient left: in  chair;with call bell/phone within reach;with family/visitor present Nurse Communication: Mobility status PT Visit Diagnosis: Difficulty in walking, not elsewhere classified (R26.2)     Time: 5809-9833 PT Time Calculation (min) (ACUTE ONLY): 16 min  Charges:  $Therapeutic Activity: 8-22 mins                     Jannette Spanner PT, DPT Physical Therapist Acute Rehabilitation Services Preferred contact method: Secure Chat Weekend Pager Only: [phone] Office: [phone]    Kimberly Sellers [place] 06/10/2022, 12:04 PM

## 2022-06-10 NOTE — Consult Note (Signed)
Cardiology Consultation   Patient ID: Kimberly Sellers MRN: [id number]; DOB: [birth year]  Admit date: 06/08/2022 Date of Consult: 06/10/2022  PCP:  Hillery Aldo, NP   [facility] Providers Cardiologist:  Lance Muss, MD        Patient Profile:   Kimberly Sellers is a 77 y.o. female with a hx of anxiety, osteoarthritis, type 2 diabetes, GERD, hypertension, adrenal nodule, partial small bowel obstruction, history of supraventricular tachycardia was consulted evaluation of bradycardia nausea and lightheadedness who is being seen 06/10/2022 for the evaluation of bradycardia at the request of Dr. Robb Matar.  History of Present Illness:   Ms. Kimberly Sellers was admitted by the orthopedic team for computer-assisted left total knee arthroplasty on 06/08/2022.  Per hospitalist and nursing notes, patient noted to be bradycardic on the morning of 1/25 with reported sensation of lightheadedness.  This prompted cardiology consult.  On my exam, patient reports that she continues to feel "woozy and lightheaded."  Her symptoms of lightheadedness are not really exacerbated by movement or positional changes.  She denies palpitations, chest pain, shortness of breath.  Patient has minimal cardiac history. Her last echocardiogram in May 2022 showed LVEF 60 to 65%, grade 1 diastolic dysfunction, trivial mitral valve regurgitation, trivial aortic valve regurgitation, mild to moderate aortic valve sclerosis without stenosis.  She was seen by Dr. Eldridge Dace in August 2023 for evaluation of precordial chest pain.  This study was normal/low risk.  No ST deviation was noted with stress.  Sinus bradycardia at rest with occasional PACs and PVCs.  LV perfusion was normal with no evidence of ischemia.  No evidence of infarction either.  LV function normal with EF 57%.   Past Medical History:  Diagnosis Date   Anxiety    Arthritis    Diabetes mellitus without complication (HCC)    Dyspnea    with exertion   GERD  (gastroesophageal reflux disease)    Hypertension    Nausea & vomiting 06/01/2012    Past Surgical History:  Procedure Laterality Date   ABDOMINAL HYSTERECTOMY     CHOLECYSTECTOMY     KNEE ARTHROPLASTY Left 06/08/2022   Procedure: COMPUTER ASSISTED TOTAL KNEE ARTHROPLASTY;  Surgeon: Samson Frederic, MD;  Location: [facility];  Service: Orthopedics;  Laterality: Left;  160     Home Medications:  Prior to Admission medications   Medication Sig Start Date End Date Taking? Authorizing Provider  amLODipine (NORVASC) 10 MG tablet Take 10 mg by mouth daily.   Yes [provider]  aspirin (ASPIRIN CHILDRENS) 81 MG chewable tablet Chew 1 tablet (81 mg total) by mouth 2 (two) times daily with a meal. 06/08/22 07/23/22 Yes Swinteck, Arlys John, MD  aspirin EC 81 MG tablet Take 81 mg by mouth daily.   Yes [provider]  Cholecalciferol (VITAMIN D3) 2000 units capsule Take 2,000 Units by mouth daily.   Yes [provider]  cycloSPORINE (RESTASIS) 0.05 % ophthalmic emulsion Place 1 drop into both eyes 2 (two) times daily as needed (dry eyes).   Yes [provider]  docusate sodium (COLACE) 100 MG capsule Take 1 capsule (100 mg total) by mouth 2 (two) times daily. 06/08/22 08/07/22 Yes Swinteck, Arlys John, MD  lisinopril-hydrochlorothiazide (PRINZIDE,ZESTORETIC) 20-25 MG tablet Take 1 tablet by mouth daily.   Yes [provider]  loratadine (CLARITIN) 10 MG tablet Take 10 mg by mouth daily as needed for allergies.   Yes [provider]  Menthol, Topical Analgesic, (ICY HOT EX) Apply 1 Application topically  daily as needed (pain).   Yes [provider]  metFORMIN (GLUCOPHAGE) 500 MG tablet Take 500 mg by mouth daily. 12/02/19  Yes [provider]  Multiple Vitamin (MULTIVITAMIN PO) Take 1 tablet by mouth daily.   Yes [provider]  ondansetron (ZOFRAN) 4 MG tablet Take 1 tablet (4 mg total) by mouth every 8 (eight) hours as needed for nausea  or vomiting. 06/08/22  Yes Swinteck, Arlys John, MD  oxyCODONE (ROXICODONE) 5 MG immediate release tablet Take 1 tablet (5 mg total) by mouth every 4 (four) hours as needed for up to 7 days for severe pain. 06/08/22 06/15/22 Yes Swinteck, Arlys John, MD  pantoprazole (PROTONIX) 40 MG tablet Take 40 mg by mouth daily. 01/28/21  Yes [provider]  polyethylene glycol (MIRALAX) 17 g packet Take 17 g by mouth daily as needed for moderate constipation or severe constipation. 06/08/22  Yes Swinteck, Arlys John, MD  senna (SENOKOT) 8.6 MG TABS tablet Take 2 tablets (17.2 mg total) by mouth at bedtime. 06/08/22 08/07/22 Yes Swinteck, Arlys John, MD  acetaminophen (TYLENOL) 500 MG tablet Take 2 tablets (1,000 mg total) by mouth every 8 (eight) hours as needed for moderate pain. 06/08/22 07/08/22  Swinteck, Arlys John, MD  meloxicam (MOBIC) 7.5 MG tablet Take 1 tablet (7.5 mg total) by mouth daily as needed for pain. 06/08/22 10/06/22  Samson Frederic, MD    Inpatient Medications: Scheduled Meds:  amLODipine  10 mg Oral Daily   aspirin  81 mg Oral BID   docusate sodium  100 mg Oral BID   lisinopril  20 mg Oral Daily   And   hydrochlorothiazide  25 mg Oral Daily   insulin aspart  0-5 Units Subcutaneous QHS   insulin aspart  0-9 Units Subcutaneous TID WC   metFORMIN  500 mg Oral Q breakfast   pantoprazole  40 mg Oral Daily   senna  1 tablet Oral BID   Continuous Infusions:  methocarbamol (ROBAXIN) IV Stopped (06/08/22 1311)   PRN Meds: acetaminophen, alum & mag hydroxide-simeth, diphenhydrAMINE, HYDROmorphone (DILAUDID) injection, menthol-cetylpyridinium **OR** phenol, methocarbamol **OR** methocarbamol (ROBAXIN) IV, metoCLOPramide **OR** metoCLOPramide (REGLAN) injection, ondansetron **OR** ondansetron (ZOFRAN) IV, mouth rinse, oxyCODONE, oxyCODONE, polyethylene glycol  Allergies:   No Known Allergies  Social History:   Social History   Socioeconomic History   Marital status: Widowed    Spouse name: Not on file    Number of children: Not on file   Years of education: Not on file   Highest education level: Not on file  Occupational History   Not on file  Tobacco Use   Smoking status: Former    Types: Cigarettes   Smokeless tobacco: Never  Vaping Use   Vaping Use: Never used  Substance and Sexual Activity   Alcohol use: No   Drug use: No   Sexual activity: Never    Birth control/protection: Post-menopausal  Other Topics Concern   Not on file  Social History Narrative   Not on file   Social Determinants of Health   Financial Resource Strain: Not on file  Food Insecurity: No Food Insecurity (06/08/2022)   Hunger Vital Sign    Worried About Running Out of Food in the Last Year: Never true    Ran Out of Food in the Last Year: Never true  Transportation Needs: No Transportation Needs (06/08/2022)   PRAPARE - Administrator, Civil Service (Medical): No    Lack of Transportation (Non-Medical): No  Physical Activity: Not on file  Stress: Not on file  Social Connections: Not on file  Intimate Partner Violence: Not At Risk (06/08/2022)   Humiliation, Afraid, Rape, and Kick questionnaire    Fear of Current or Ex-Partner: No    Emotionally Abused: No    Physically Abused: No    Sexually Abused: No    Family History:    Family History  Problem Relation Age of Onset   Breast cancer Cousin        53s     ROS:  Please see the history of present illness.   All other ROS reviewed and negative.     Physical Exam/Data:   Vitals:   06/09/22 1310 06/09/22 1829 06/09/22 2225 06/10/22 0527  BP: (!) 160/84 (!) 128/58 128/63 (!) 149/67  Pulse: 72  88 76  Resp: 17  16 16   Temp: 98.9 F (37.2 C)  99.4 F (37.4 C) 99.8 F (37.7 C)  TempSrc:   Oral Oral  SpO2: 100%  98% 98%  Weight:      Height:        Intake/Output Summary (Last 24 hours) at 06/10/2022 1015 Last data filed at 06/10/2022 7824 Gross per 24 hour  Intake 1536.94 ml  Output 2200 ml  Net -663.06 ml       06/08/2022    7:01 AM 05/31/2022    9:21 AM 12/21/2021    7:22 AM  Last 3 Weights  Weight (lbs) 180 lb 180 lb 187 lb  Weight (kg) 81.647 kg 81.647 kg 84.823 kg     Body mass index is 28.19 kg/m.  General:  Well nourished, well developed, in no acute distress HEENT: normal Neck: no JVD Vascular: No carotid bruits; Distal pulses 2+ bilaterally Cardiac:  normal S1, S2; RRR; no murmur  Lungs: Bilateral upper lobes clear, lower lobe crackles versus atelectasis Abd: soft, nontender, no hepatomegaly  Ext: no edema Musculoskeletal:  No deformities, BUE and BLE strength normal and equal Skin: warm and dry  Neuro:  CNs 2-12 intact, no focal abnormalities noted Psych:  Normal affect   EKG:  The EKG was personally reviewed and demonstrates: Normal sinus rhythm Telemetry:  Telemetry was personally reviewed and demonstrates: Sinus arrhythmia with intermittent PACs.  Isolated 4 beat run of NSVT.  Patient without symptoms  Relevant CV Studies:  12/21/21 Stress Test    The study is normal. The study is low risk.   No ST deviation was noted with stress. Sinus bradycardia at rest with occasional PACs and PVCs.   LV perfusion is normal. There is no evidence of ischemia. There is no evidence of infarction.   Left ventricular function is normal. Nuclear stress EF: 57 %. The left ventricular ejection fraction is normal (55-65%). End diastolic cavity size is normal. End systolic cavity size is normal.   Prior study available for comparison from 09/13/2001 - report only. No changes compared to prior study.  10/13/20 TTE  IMPRESSIONS     1. Left ventricular ejection fraction, by estimation, is 60 to 65%. Left  ventricular ejection fraction by PLAX is 61 %. The left ventricle has  normal function. The left ventricle has no regional wall motion  abnormalities. Left ventricular diastolic  parameters are consistent with Grade I diastolic dysfunction (impaired  relaxation). The average left ventricular  global longitudinal strain is  -22.4 %. The global longitudinal strain is normal.   2. Right ventricular systolic function is normal. The right ventricular  size is normal. There is normal pulmonary artery systolic pressure. The  estimated right ventricular systolic pressure is 37.6 mmHg.   3. The mitral valve is abnormal. Trivial mitral valve regurgitation.   4. The aortic valve is tricuspid. Aortic valve regurgitation is trivial.  Mild to moderate aortic valve sclerosis/calcification is present, without  any evidence of aortic stenosis. Aortic regurgitation PHT measures 706  msec. Aortic valve mean gradient  measures 8.0 mmHg. DI is 0.51.   Comparison(s): 06/04/12 EF 50-55%.   FINDINGS   Left Ventricle: Left ventricular ejection fraction, by estimation, is 60  to 65%. Left ventricular ejection fraction by PLAX is 61 %. The left  ventricle has normal function. The left ventricle has no regional wall  motion abnormalities. The average left  ventricular global longitudinal strain is -22.4 %. The global longitudinal  strain is normal. The left ventricular internal cavity size was normal in  size. There is no left ventricular hypertrophy. Left ventricular diastolic  parameters are consistent with   Grade I diastolic dysfunction (impaired relaxation). Indeterminate  filling pressures.   Right Ventricle: The right ventricular size is normal. No increase in  right ventricular wall thickness. Right ventricular systolic function is  normal. There is normal pulmonary artery systolic pressure. The tricuspid  regurgitant velocity is 2.48 m/s, and   with an assumed right atrial pressure of 3 mmHg, the estimated right  ventricular systolic pressure is 28.3 mmHg.   Left Atrium: Left atrial size was normal in size.   Right Atrium: Right atrial size was normal in size.   Pericardium: There is no evidence of pericardial effusion.   Mitral Valve: The mitral valve is abnormal. There is mild  thickening of  the mitral valve leaflet(s). There is mild calcification of the mitral  valve leaflet(s). Trivial mitral valve regurgitation.   Tricuspid Valve: The tricuspid valve is grossly normal. Tricuspid valve  regurgitation is mild.   Aortic Valve: The aortic valve is tricuspid. Aortic valve regurgitation is  trivial. Aortic regurgitation PHT measures 706 msec. Mild to moderate  aortic valve sclerosis/calcification is present, without any evidence of  aortic stenosis. Aortic valve mean  gradient measures 8.0 mmHg. Aortic valve peak gradient measures 16.4 mmHg.  Aortic valve area, by VTI measures 1.76 cm.   Pulmonic Valve: The pulmonic valve was normal in structure. Pulmonic valve  regurgitation is not visualized.   Aorta: The aortic root and ascending aorta are structurally normal, with  no evidence of dilitation.   IAS/Shunts: No atrial level shunt detected by color flow Doppler.    Laboratory Data:  High Sensitivity Troponin:  No results for input(s): "TROPONINIHS" in the last 720 hours.   Chemistry Recent Labs  Lab 06/09/22 0333 06/09/22 1149  NA 141  --   K 3.9  --   CL 112*  --   CO2 24  --   GLUCOSE 112*  --   BUN 14  --   CREATININE 0.64  --   CALCIUM 8.0*  --   MG  --  1.9  GFRNONAA >60  --   ANIONGAP 5  --     No results for input(s): "PROT", "ALBUMIN", "AST", "ALT", "ALKPHOS", "BILITOT" in the last 168 hours. Lipids No results for input(s): "CHOL", "TRIG", "HDL", "LABVLDL", "LDLCALC", "CHOLHDL" in the last 168 hours.  Hematology Recent Labs  Lab 06/09/22 0333 06/10/22 0323  WBC 11.7* 10.6*  RBC 3.54* 3.72*  HGB 9.3* 9.7*  HCT 29.5* 30.1*  MCV 83.3 80.9  MCH 26.3 26.1  MCHC 31.5 32.2  RDW 16.3* 16.1*  PLT  254 260   Thyroid  Recent Labs  Lab 06/09/22 1149  TSH 0.362    BNPNo results for input(s): "BNP", "PROBNP" in the last 168 hours.  DDimer No results for input(s): "DDIMER" in the last 168 hours.   Radiology/Studies:  DG Knee  Left Port  Result Date: 06/08/2022 CLINICAL DATA:  Status post total left knee arthroplasty. Postoperative. EXAM: PORTABLE LEFT KNEE - 1-2 VIEW COMPARISON:  Left knee radiographs 04/06/2020 FINDINGS: Status post total left knee arthroplasty. No perihardware lucency is seen to indicate hardware failure or loosening. Redemonstration of curvilinear ossific density overlying the proximal medial collateral ligament, just medial to the superior medial femoral condyle (chronic Pellegrini-Stieda lesion). Postoperative subcutaneous and intra-articular air are seen. No significant joint fluid. Anterior surgical skin staples. No acute fracture or dislocation. Mild vascular calcifications. IMPRESSION: Status post total left knee arthroplasty without evidence of hardware failure. Electronically Signed   By: Neita Garnet M.D.   On: 06/08/2022 12:18     Assessment and Plan:  Kimberly Sellers is a 77 y.o. female with a hx of anxiety, osteoarthritis, type 2 diabetes, GERD, hypertension, adrenal nodule, partial small bowel obstruction, history of supraventricular tachycardia was consulted evaluation of bradycardia nausea and lightheadedness who is being seen 06/10/2022 for the evaluation of bradycardia at the request of Dr. Robb Matar.  Bradycardia  Patient is postoperative day 2 following total left knee arthroplasty.  Cardiology consulted for evaluation given possible bradycardia.  Unfortunately patient was not on telemetry at the time low heart rates were reported with vital signs.  Since being placed on telemetry, no occurrence of bradycardia.  Patient has baseline sinus rhythm with intermittent PACs, occasional brief sinus pause.  Bradycardia yesterday possibly secondary to pain medication regimen and/or anesthesia. No concerning arrhythmias on telemetry or ECG that would explain patient's dizziness/lightheadedness.  Suspect that this is secondary to oxycodone/methocarbamol. Cardiology will sign off.  Please reach out  if there are additional questions or concerns.  Hypertension  Continue home amlodipine 10 mg, lisinopril/hydrochlorothiazide 20-25 mg  Per primary team: GERD Type 2 diabetes Adrenal nodule  Risk Assessment/Risk Scores:                For questions or updates, please contact [facility] Please consult [email or web address] for contact info under    Signed, Perlie Gold, PA-C  06/10/2022 10:15 AM

## 2022-06-10 NOTE — Plan of Care (Signed)
  Problem: Pain Management: Goal: Pain level will decrease with appropriate interventions Outcome: Progressing  Problem: Education: Goal: Ability to describe self-care measures that may prevent or decrease complications (Diabetes Survival Skills Education) will improve Outcome: Progressing   Problem: Coping: Goal: Ability to adjust to condition or change in health will improve Outcome: Progressing   Problem: Activity: Goal: Ability to avoid complications of mobility impairment will improve Outcome: Progressing   Problem: Clinical Measurements: Goal: Postoperative complications will be avoided or minimized Outcome: [place], RN 06/10/22 1:05 PM

## 2022-06-11 ENCOUNTER — Inpatient Hospital Stay (HOSPITAL_COMMUNITY): Payer: 59

## 2022-06-11 DIAGNOSIS — R9431 Abnormal electrocardiogram [ECG] [EKG]: Secondary | ICD-10-CM

## 2022-06-11 DIAGNOSIS — R001 Bradycardia, unspecified: Secondary | ICD-10-CM | POA: Diagnosis not present

## 2022-06-11 DIAGNOSIS — M1712 Unilateral primary osteoarthritis, left knee: Secondary | ICD-10-CM | POA: Diagnosis not present

## 2022-06-11 DIAGNOSIS — I159 Secondary hypertension, unspecified: Secondary | ICD-10-CM | POA: Diagnosis not present

## 2022-06-11 DIAGNOSIS — R42 Dizziness and giddiness: Secondary | ICD-10-CM

## 2022-06-11 DIAGNOSIS — D649 Anemia, unspecified: Secondary | ICD-10-CM | POA: Diagnosis not present

## 2022-06-11 LAB — ECHOCARDIOGRAM COMPLETE
AR max vel: 1.81 cm2
AV Area VTI: 1.8 cm2
AV Area mean vel: 1.84 cm2
AV Mean grad: 9 mmHg
AV Peak grad: 17 mmHg
Ao pk vel: 2.06 m/s
Area-P 1/2: 2.6 cm2
Calc EF: 66.9 %
Height: 67 in
P 1/2 time: 393 msec
S' Lateral: 2.7 cm
Single Plane A2C EF: 65.4 %
Single Plane A4C EF: 67.2 %
Weight: 2879.98 oz

## 2022-06-11 LAB — GLUCOSE, CAPILLARY
Glucose-Capillary: 98 mg/dL (ref 70–99)
Glucose-Capillary: 95 mg/dL (ref 70–99)
Glucose-Capillary: 104 mg/dL — ABNORMAL HIGH (ref 70–99)
Glucose-Capillary: 129 mg/dL — ABNORMAL HIGH (ref 70–99)

## 2022-06-11 MED ORDER — MECLIZINE HCL 25 MG PO TABS
25.0000 mg | ORAL_TABLET | Freq: Three times a day (TID) | ORAL | Status: DC | PRN
  Administered 2022-06-11 – 2022-06-13 (×5): 25 mg via ORAL
  Filled 2022-06-11 (×5): qty 1

## 2022-06-11 NOTE — Plan of Care (Signed)
  Problem: Pain Managment: Goal: General experience of comfort will improve Outcome: Progressing   Problem: Pain Management: Goal: Pain level will decrease with appropriate interventions Outcome: Progressing   Problem: Activity: Goal: Range of joint motion will improve Outcome: Progressing   Problem: Education: Goal: Knowledge of the prescribed therapeutic regimen will improve Outcome: Progressing

## 2022-06-11 NOTE — Plan of Care (Signed)
  Problem: Education: Goal: Ability to describe self-care measures that may prevent or decrease complications (Diabetes Survival Skills Education) will improve Outcome: Progressing   Problem: Coping: Goal: Ability to adjust to condition or change in health will improve Outcome: Progressing   Problem: Nutritional: Goal: Maintenance of adequate nutrition will improve Outcome: Progressing   Problem: Skin Integrity: Goal: Risk for impaired skin integrity will decrease Outcome: Progressing   Problem: Education: Goal: Knowledge of the prescribed therapeutic regimen will improve Outcome: Progressing   Problem: Activity: Goal: Ability to avoid complications of mobility impairment will improve Outcome: Progressing   Problem: Pain Management: Goal: Pain level will decrease with appropriate interventions Outcome: Progressing   Problem: Elimination: Goal: Will not experience complications related to bowel motility Outcome: Progressing   Problem: Safety: Goal: Ability to remain free from injury will improve Outcome: Progressing   Problem: Skin Integrity: Goal: Risk for impaired skin integrity will decrease Outcome: Progressing

## 2022-06-11 NOTE — Progress Notes (Signed)
Echocardiogram 2D Echocardiogram has been performed.  [name] 06/11/2022, 8:38 AM

## 2022-06-11 NOTE — Progress Notes (Signed)
PROGRESS NOTE    [name]  LNL:[id number] DOB: October 22, 1945 DOA: 06/08/2022 PCP: Cipriano Mile, NP    Brief Narrative:   Kimberly Sellers is a 77 y.o. female with past medical history of anxiety, osteoarthritis, type 2 diabetes, GERD, hypertension,  adrenal nodule, partial small bowel obstruction, history of supraventricular tachycardia was consulted for evaluation of bradycardia nausea and lightheadedness.  Patient was admitted by orthopedics team for osteoarthritis on the left knee and underwent computer-assisted left total knee arthroplasty on 06/08/2022.    Assessment and plan   Dizziness vertigo, nausea.  Could be multifactorial including postoperative situation medications but the vertigo seems to be  positional.  Will add meclizine.  Will Ask PT to do vestibular assessment.    Sinus bradycardia Question vasovagal.  Telemetry without any significant arrhythmia.  2D echocardiogram.  Cardiology on board and no significant recommendations were done.     HTN (hypertension) Patient is on amlodipine, lisinopril and HCTZ.  Hold off  beta-blockers and nondihydropyridine CCB.     Normocytic anemia Latest hemoglobin 9.7.     DVT prophylaxis: SCDs Start: 06/08/22 1541   Code Status:     Code Status: Full Code  Disposition: Likely home as per primary team.    Family Communication: Patient's family at bedside.  Consultants:  Cardiology  Procedures:  2D echocardiogram  Antimicrobials:  None  Subjective: Today, patient was seen and examined at bedside.  Feels a little dizzy and vertigo especially when turning her head and changing positions.  Wishes to eat something.  Does have some nausea.  Family at bedside.  Objective: Vitals:   06/10/22 1320 06/10/22 2118 06/11/22 0559 06/11/22 0930  BP: 134/62 125/65 (!) 118/56 (!) 108/58  Pulse: 95 87 88   Resp: 17 17 16    Temp: 99.2 F (37.3 C) 99 F (37.2 C) 99.1 F (37.3 C)   TempSrc:  Oral    SpO2: 99% 99% 97%    Weight:      Height:        Intake/Output Summary (Last 24 hours) at 06/11/2022 1011 Last data filed at 06/11/2022 0543 Gross per 24 hour  Intake 240 ml  Output 1900 ml  Net -1660 ml    Filed Weights   06/08/22 0701  Weight: 81.6 kg    Physical Examination: Body mass index is 28.19 kg/m.   General:  Average built, not in obvious distress, elderly female, alert awake and Communicative HENT:   No scleral pallor or icterus noted. Oral mucosa is moist.  Chest:  Clear breath sounds.  Diminished breath sounds bilaterally. No crackles or wheezes.  CVS: S1 &S2 heard. No murmur.  Regular rate and rhythm. Abdomen: Soft, nontender, nondistended.  Bowel sounds are heard.   Extremities: Knee immobilizer status post left total knee arthroplasty. Psych: Alert, awake and oriented, mildly anxious more CNS:  No cranial nerve deficits.  Power equal in all extremities.   Skin: Warm and dry.  Status post left knee surgery  Data Reviewed:   CBC: Recent Labs  Lab 06/09/22 0333 06/10/22 0323  WBC 11.7* 10.6*  HGB 9.3* 9.7*  HCT 29.5* 30.1*  MCV 83.3 80.9  PLT 254 260     Basic Metabolic Panel: Recent Labs  Lab 06/09/22 0333 06/09/22 1149  NA 141  --   K 3.9  --   CL 112*  --   CO2 24  --   GLUCOSE 112*  --   BUN 14  --   CREATININE 0.64  --  CALCIUM 8.0*  --   MG  --  1.9     Liver Function Tests: No results for input(s): "AST", "ALT", "ALKPHOS", "BILITOT", "PROT", "ALBUMIN" in the last 168 hours.   Radiology Studies: No results found.    LOS: 1 day    Flora Lipps, MD [facility] Available via Epic secure chat 7am-7pm After these hours, please refer to coverage provider listed on [email or web address] 06/11/2022, 10:11 AM

## 2022-06-11 NOTE — Progress Notes (Signed)
Physical Therapy Treatment Patient Details Name: GENIFER LAZENBY MRN: [id number] DOB: 1945-10-24 Today's Date: 06/11/2022   History of Present Illness Pt is a 77 yo female presenting s/p L-TKA on 06/08/22. PMH: DM, GERD, HTN    PT Comments    POD # 3 Pt not progressing due to dizziness with activity.  LPT r/o "Veritgo". 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  Feeling "dizzy", "swimmy", "goofy" with activity.  At rest "I'm fine".  Possible effects from OXY.  Vitals are all WNL. Assisted OOB to amb to bathroom.  Unsteady esp with turns.  Unable to tolerate amb in hallway after.  Assisted back to bed and applied ICE.   Pt has NOT met her mobility goals to safely D/C to home due to the above issue.   Recommendations for follow up therapy are one component of a multi-disciplinary discharge planning process, led by the attending physician.  Recommendations may be updated based on patient status, additional functional criteria and insurance authorization.  Follow Up Recommendations  Follow physician's recommendations for discharge plan and follow up therapies     Assistance Recommended at Discharge Frequent or constant Supervision/Assistance  Patient can return home with the following A little help with walking and/or transfers;A little help with bathing/dressing/bathroom;Assistance with cooking/housework;Help with stairs or ramp for entrance;Assist for transportation   Equipment Recommendations  None recommended by PT    Recommendations for Other Services       Precautions / Restrictions Precautions Precautions: Fall;Knee Precaution Comments: no pillow under the knee Restrictions Weight Bearing Restrictions: No Other Position/Activity Restrictions: WBAT     Mobility  Bed Mobility Overal bed mobility: Needs Assistance Bed Mobility: Supine to Sit, Sit to Supine     Supine to sit: Min assist Sit to supine: Min assist, Mod assist   General bed mobility  comments: requiring assist for Lt LE and increased time    Transfers Overall transfer level: Needs assistance Equipment used: Rolling walker (2 wheels) Transfers: Sit to/from Stand Sit to Stand: Min assist   Step pivot transfers: Min assist       General transfer comment: 50% VC's on proper walker to self distance as well as safety with turns.  Increased c/o "dizziness" with activity.  Unsteady.  HIGH FALL RISK    Ambulation/Gait Ambulation/Gait assistance: Min assist Gait Distance (Feet): 24 Feet (12 feet x 2) Assistive device: Rolling walker (2 wheels) Gait Pattern/deviations: Step-to pattern, Antalgic, Decreased stance time - left Gait velocity: decreased     General Gait Details: amb to and from bathroom only.  VERY unsteady (drunk).  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  Feeling "dizzy", "swimmy", "goofy" with activity.  At rest "I'm fine".  Possible effects from OXY.  Vitals are all WNL.  Exercises      General Comments        Pertinent Vitals/Pain Pain Assessment Pain Assessment: 0-10 Pain Score: 5  Pain Location: L knee Pain Descriptors / Indicators: Operative site guarding, Grimacing, Tender Pain Intervention(s): Monitored during session, Premedicated before session, Repositioned, Ice applied    Home Living                          Prior Function            PT Goals (current goals can now be found in the care plan section) Progress towards PT goals: Progressing toward goals    Frequency    7X/week      PT Plan Current plan remains appropriate    Co-evaluation              AM-PAC PT "6 Clicks" Mobility    Outcome Measure  Help needed turning from your back to your side while in a flat bed without using bedrails?: A Little Help needed moving from lying on your back to sitting on the side of a flat bed without using bedrails?: A Little Help needed moving to and from a bed to a chair (including a wheelchair)?: A Little Help needed standing up from a chair using your arms (e.g., wheelchair or bedside chair)?: A Little Help needed to walk in hospital room?: A Lot Help needed climbing 3-5 steps with a railing? : A Lot 6 Click Score: 16    End of Session Equipment Utilized During Treatment: Gait belt Activity Tolerance: Patient limited by fatigue;Other (comment) (Dizziness) Patient left: in bed;with call bell/phone within reach;with bed alarm set Nurse Communication: Mobility status PT Visit Diagnosis: Difficulty in walking, not elsewhere classified (R26.2) Pain - Right/Left: Left Pain - part of body: Knee     Time: 1415-1440 PT Time Calculation (min) (ACUTE ONLY): 25 min  Charges:  $Gait Training: 8-22 mins $Therapeutic Activity: 8-22 mins                    Rica Koyanagi  PTA [place] Office M-F          [phone] Weekend pager [phone]

## 2022-06-11 NOTE — Progress Notes (Signed)
Physical Therapy Treatment Patient Details Name: Kimberly Sellers MRN: [id number] DOB: Oct 03, 1945 Today's Date: 06/11/2022   History of Present Illness Pt is a 77 yo female presenting s/p L-TKA on 06/08/22. PMH: DM, GERD, HTN    PT Comments    Vestibular assessment performed and documented below.  Pt did not have any nystagmus with testing however reports "wobbly" and "woozy" sensation with all testing.  Pt did not describe dizziness in any typical vestibular terms. Pt denies spinning.  Pt endorses objects moving left/right, hallucinating, and difficulty focusing.  Pt reports her symptoms remained mostly the same with all performed testing.  Pt declined OOB activity end of session due to fatigue.   06/11/22 1200  Symptom Behavior  Subjective history of current problem Pt reports "dizziness" started after surgery.  Pt denies any history of vertigo.  Pt states dizziness is mostly constant and only goes away when pt is laying or at rest.  Pt describes dizziness in terms of "wobbly" and "woozy".  Type of Dizziness  Comment ("wobbly" "woozy", denies spinning)  Frequency of Dizziness mostly constant  Duration of Dizziness started right after knee surgery  Symptom Nature Constant;Motion provoked (worse with head movment and motion)  Aggravating Factors Activity in general;Turning head quickly;Sitting with head tilted back;Forward bending;Supine to sit;Sit to stand;Rolling to right;Rolling to left  Relieving Factors Closing eyes;Rest;Slow movements  Progression of Symptoms No change since onset  History of similar episodes none  Oculomotor Exam  Oculomotor Alignment Normal  Spontaneous Absent  Gaze-induced  Absent  Comment Pt with difficulty focusing and reports any object she views is moving left and right.  No nystagmus observed during session. Pt did have difficulty tracking object.  Positional Testing  Sidelying Test Sidelying Right;Sidelying Left  Horizontal Canal Testing Horizontal Canal  Right;Horizontal Canal Left  Sidelying Right  Sidelying Right Symptoms No nystagmus  Sidelying Left  Sidelying Left Symptoms No nystagmus  Horizontal Canal Right  Horizontal Canal Right Symptoms Normal  Horizontal Canal Left  Horizontal Canal Left Symptoms Normal     Recommendations for follow up therapy are one component of a multi-disciplinary discharge planning process, led by the attending physician.  Recommendations may be updated based on patient status, additional functional criteria and insurance authorization.  Follow Up Recommendations  Follow physician's recommendations for discharge plan and follow up therapies     Assistance Recommended at Discharge Frequent or constant Supervision/Assistance  Patient can return home with the following A little help with walking and/or transfers;A little help with bathing/dressing/bathroom;Assistance with cooking/housework;Help with stairs or ramp for entrance;Assist for transportation   Equipment Recommendations  None recommended by PT    Recommendations for Other Services       Precautions / Restrictions Precautions Precautions: Fall;Knee Restrictions Other Position/Activity Restrictions: WBAT     Mobility  Bed Mobility Overal bed mobility: Needs Assistance Bed Mobility: Supine to Sit, Sit to Supine     Supine to sit: Min assist Sit to supine: Min assist   General bed mobility comments: requiring assist for Lt LE due to increased pain    Transfers                        Ambulation/Gait                   Stairs             Wheelchair Mobility    Modified Rankin (Stroke Patients Only)  Balance                                            Cognition Arousal/Alertness: Awake/alert Behavior During Therapy: WFL for tasks assessed/performed Overall Cognitive Status: Within Functional Limits for tasks assessed                                           Exercises      General Comments        Pertinent Vitals/Pain Pain Assessment Pain Assessment: 0-10 Pain Score: 7  Pain Descriptors / Indicators: Operative site guarding, Aching, Sore Pain Intervention(s): Repositioned, Monitored during session, Patient requesting pain meds-RN notified    Home Living                          Prior Function            PT Goals (current goals can now be found in the care plan section) Progress towards PT goals: Progressing toward goals    Frequency    7X/week      PT Plan Current plan remains appropriate    Co-evaluation              AM-PAC PT "6 Clicks" Mobility   Outcome Measure  Help needed turning from your back to your side while in a flat bed without using bedrails?: A Little Help needed moving from lying on your back to sitting on the side of a flat bed without using bedrails?: A Little Help needed moving to and from a bed to a chair (including a wheelchair)?: A Little Help needed standing up from a chair using your arms (e.g., wheelchair or bedside chair)?: A Little Help needed to walk in hospital room?: A Lot Help needed climbing 3-5 steps with a railing? : A Lot 6 Click Score: 16    End of Session   Activity Tolerance: Patient limited by pain;Patient limited by fatigue Patient left: in bed;with call bell/phone within reach;with bed alarm set Nurse Communication: Mobility status PT Visit Diagnosis: Difficulty in walking, not elsewhere classified (R26.2)     Time: 8182-9937 PT Time Calculation (min) (ACUTE ONLY): 34 min  Charges:  $Therapeutic Activity: 8-22 mins $Physical Performance Test: 8-22 mins          Arlyce Dice, DPT Physical Therapist Acute Rehabilitation Services Preferred contact method: Secure Chat Weekend Pager Only: [phone] Office: [place] 06/11/2022, 1:47 PM

## 2022-06-12 DIAGNOSIS — D649 Anemia, unspecified: Secondary | ICD-10-CM | POA: Diagnosis not present

## 2022-06-12 DIAGNOSIS — M1712 Unilateral primary osteoarthritis, left knee: Secondary | ICD-10-CM | POA: Diagnosis not present

## 2022-06-12 DIAGNOSIS — R001 Bradycardia, unspecified: Secondary | ICD-10-CM | POA: Diagnosis not present

## 2022-06-12 DIAGNOSIS — I159 Secondary hypertension, unspecified: Secondary | ICD-10-CM | POA: Diagnosis not present

## 2022-06-12 LAB — GLUCOSE, CAPILLARY
Glucose-Capillary: 83 mg/dL (ref 70–99)
Glucose-Capillary: 93 mg/dL (ref 70–99)
Glucose-Capillary: 99 mg/dL (ref 70–99)
Glucose-Capillary: 94 mg/dL (ref 70–99)

## 2022-06-12 MED ORDER — HYDROCODONE-ACETAMINOPHEN 7.5-325 MG PO TABS
1.0000 | ORAL_TABLET | ORAL | Status: DC | PRN
  Administered 2022-06-13: 1 via ORAL
  Filled 2022-06-12: qty 1

## 2022-06-12 MED ORDER — HYDROCODONE-ACETAMINOPHEN 5-325 MG PO TABS
1.0000 | ORAL_TABLET | ORAL | Status: DC | PRN
  Administered 2022-06-12: 1 via ORAL
  Filled 2022-06-12: qty 1

## 2022-06-12 NOTE — Progress Notes (Signed)
    Subjective:  Patient reports pain as mild to moderate.  Currently denies N/V/CP/SOB/Abd pain. She is still reporting dizziness with lifting her head.   Objective:   VITALS:   Vitals:   06/11/22 0930 06/11/22 1321 06/11/22 2207 06/12/22 0607  BP: (!) 108/58 (!) 115/98 121/67 134/68  Pulse:  87 85 80  Resp:  18 18 18   Temp:  99.2 F (37.3 C) 98.8 F (37.1 C) 98.4 F (36.9 C)  TempSrc:  Oral Oral   SpO2:  100%  100%  Weight:      Height:        Patient lying in bed. NAD.  Neurologically intact ABD soft Neurovascular intact Sensation intact distally Intact pulses distally Dorsiflexion/Plantar flexion intact Incision: dressing C/D/I No cellulitis present Compartment soft   Lab Results  Component Value Date   WBC 10.6 (H) 06/10/2022   HGB 9.7 (L) 06/10/2022   HCT 30.1 (L) 06/10/2022   MCV 80.9 06/10/2022   PLT 260 06/10/2022   BMET    Component Value Date/Time   NA 141 06/09/2022 0333   K 3.9 06/09/2022 0333   CL 112 (H) 06/09/2022 0333   CO2 24 06/09/2022 0333   GLUCOSE 112 (H) 06/09/2022 0333   BUN 14 06/09/2022 0333   CREATININE 0.64 06/09/2022 0333   CALCIUM 8.0 (L) 06/09/2022 0333   GFRNONAA >60 06/09/2022 7741     Assessment/Plan: 4 Days Post-Op   Principal Problem:   Osteoarthritis of left knee Active Problems:   HTN (hypertension)   Sinus bradycardia   Normocytic anemia   WBAT with walker DVT ppx: Aspirin, SCDs, TEDS PO pain control PT/OT: She ambulated 24 feet with PT yesterday. Limited due to feeling dizzy.  Dispo:  [facility] consulting with patient, appreciate their help. Patient started on meclizine to see if it helps dizziness.    - Stopped methocarbamol and switched oxycodone to hydrocodone to see if it helps with dizziness. From an orthopedic stand point patient can discharge home once cleared with PT and TRH.Continue incentive spirometry use 10x/hour. Medication has been sent to the pharmacy.       Charlott Rakes,  PA-C 06/12/2022, 9:25 AM   [facility]  [place] [address], [place], [place] [address] Phone: [phone] [email or web address] Facebook  [employer]

## 2022-06-12 NOTE — Plan of Care (Signed)
  Problem: Education: Goal: Knowledge of the prescribed therapeutic regimen will improve Outcome: Progressing   Problem: Activity: Goal: Range of joint motion will improve Outcome: Progressing   Problem: Pain Management: Goal: Pain level will decrease with appropriate interventions Outcome: Progressing   Problem: Safety: Goal: Ability to remain free from injury will improve Outcome: Progressing   

## 2022-06-12 NOTE — Progress Notes (Signed)
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 PMH: DM, GERD, HTN    PT Comments    Pt reports pain today but dizziness a little better.  Pt assisted with mobility (still reports dizziness with mobilizing) and only able ambulate short distance due to tachycardia.  RN notified.  HR 120 --> 165 -->  107 bpm.    Recommendations for follow up therapy are one component of a multi-disciplinary discharge planning process, led by the attending physician.  Recommendations may be updated based on patient status, additional functional criteria and insurance authorization.  Follow Up Recommendations  Follow physician's recommendations for discharge plan and follow up therapies     Assistance Recommended at Discharge Frequent or constant Supervision/Assistance  Patient can return home with the following A little help with walking and/or transfers;A little help with bathing/dressing/bathroom;Assistance with cooking/housework;Help with stairs or ramp for entrance;Assist for transportation   Equipment Recommendations  None recommended by PT    Recommendations for Other Services       Precautions / Restrictions Precautions Precautions: Fall;Knee Restrictions LLE Weight Bearing: Weight bearing as tolerated     Mobility  Bed Mobility Overal bed mobility: Needs Assistance Bed Mobility: Supine to Sit     Supine to sit: Min guard     General bed mobility comments: pt self assisted Lt LE and required increased time    Transfers Overall transfer level: Needs assistance Equipment used: Rolling walker (2 wheels) Transfers: Sit to/from Stand Sit to Stand: Min assist           General transfer comment: pt able to position LEs however required cues for hand placement, assist to rise and steady    Ambulation/Gait Ambulation/Gait  assistance: Min guard Gait Distance (Feet): 14 Feet Assistive device: Rolling walker (2 wheels) Gait Pattern/deviations: Step-to pattern, Antalgic, Decreased stance time - left Gait velocity: decreased     General Gait Details: cues for step length and posture, pt reports dizziness; HR up to 165 bpm per telemetry so had pt sit down in recliner and RN notified   Stairs             Wheelchair Mobility    Modified Rankin (Stroke Patients Only)       Balance                                            Cognition Arousal/Alertness: Awake/alert Behavior During Therapy: WFL for tasks assessed/performed Overall Cognitive Status: Within Functional Limits for tasks assessed                                          Exercises      General Comments        Pertinent Vitals/Pain      Home Living                          Prior Function            PT Goals (current goals can now be found in the care plan section) Progress towards PT goals: Not progressing toward goals - comment (dizzy, tachycardia)  Frequency    7X/week      PT Plan Current plan remains appropriate    Co-evaluation              AM-PAC PT "6 Clicks" Mobility   Outcome Measure  Help needed turning from your back to your side while in a flat bed without using bedrails?: A Little Help needed moving from lying on your back to sitting on the side of a flat bed without using bedrails?: A Little Help needed moving to and from a bed to a chair (including a wheelchair)?: A Little Help needed standing up from a chair using your arms (e.g., wheelchair or bedside chair)?: A Little Help needed to walk in hospital room?: A Lot Help needed climbing 3-5 steps with a railing? : A Lot 6 Click Score: 16    End of Session Equipment Utilized During Treatment: Gait belt Activity Tolerance: Other (comment) (dizzy, tachycardia) Patient left: in chair;with call  bell/phone within reach;with chair alarm set;with family/visitor present Nurse Communication: Mobility status PT Visit Diagnosis: Difficulty in walking, not elsewhere classified (R26.2)     Time: 0923-3007 PT Time Calculation (min) (ACUTE ONLY): 12 min  Charges:  $Gait Training: 8-22 mins                     Arlyce Dice, DPT Physical Therapist Acute Rehabilitation Services Preferred contact method: Secure Chat Weekend Pager Only: [phone] Office: [phone]    Kimberly Sellers [place] 06/12/2022, 11:49 AM

## 2022-06-12 NOTE — Progress Notes (Signed)
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 PMH: DM, GERD, HTN    PT Comments    Pt able to ambulate short distance in hallway despite pain and dizziness.  Pt reports dizziness still present but trying to "push through it."  HR 106 --> 136 --> 95 bpm per telemetry.  Pt not yet able to perform steps.    Recommendations for follow up therapy are one component of a multi-disciplinary discharge planning process, led by the attending physician.  Recommendations may be updated based on patient status, additional functional criteria and insurance authorization.  Follow Up Recommendations  Follow physician's recommendations for discharge plan and follow up therapies     Assistance Recommended at Discharge Frequent or constant Supervision/Assistance  Patient can return home with the following A little help with walking and/or transfers;A little help with bathing/dressing/bathroom;Assistance with cooking/housework;Help with stairs or ramp for entrance;Assist for transportation   Equipment Recommendations  None recommended by PT    Recommendations for Other Services       Precautions / Restrictions Precautions Precautions: Fall;Knee Restrictions LLE Weight Bearing: Weight bearing as tolerated     Mobility  Bed Mobility Overal bed mobility: Needs Assistance Bed Mobility: Sit to Supine     Supine to sit: Min guard Sit to supine: Min guard   General bed mobility comments: pt self assisted Lt LE and required increased time    Transfers Overall transfer level: Needs assistance Equipment used: Rolling walker (2 wheels) Transfers: Sit to/from Stand Sit to Stand: Min assist           General transfer comment: pt able to position LEs however required cues for hand placement, assist to rise and steady as well as control  descent due to fatigue from ambulating    Ambulation/Gait Ambulation/Gait assistance: Min guard Gait Distance (Feet): 40 Feet Assistive device: Rolling walker (2 wheels) Gait Pattern/deviations: Step-to pattern, Antalgic, Decreased stance time - left Gait velocity: decreased     General Gait Details: cues for step length and posture, pt reports dizziness however states somewhat better then previous; HR up to 136 bpm per telemetry (recliner following for safety however no required)   Stairs             Wheelchair Mobility    Modified Rankin (Stroke Patients Only)       Balance                                            Cognition Arousal/Alertness: Awake/alert Behavior During Therapy: WFL for tasks assessed/performed Overall Cognitive Status: Within Functional Limits for tasks assessed                                          Exercises      General Comments        Pertinent Vitals/Pain Pain Assessment Pain Assessment: 0-10 Pain Score: 7  Pain Location: L knee Pain Descriptors / Indicators: Sore, Aching Pain Intervention(s): Monitored during session, Repositioned    Home Living  Prior Function            PT Goals (current goals can now be found in the care plan section) Progress towards PT goals: Progressing toward goals    Frequency    7X/week      PT Plan Current plan remains appropriate    Co-evaluation              AM-PAC PT "6 Clicks" Mobility   Outcome Measure  Help needed turning from your back to your side while in a flat bed without using bedrails?: A Little Help needed moving from lying on your back to sitting on the side of a flat bed without using bedrails?: A Little Help needed moving to and from a bed to a chair (including a wheelchair)?: A Little Help needed standing up from a chair using your arms (e.g., wheelchair or bedside chair)?: A  Little Help needed to walk in hospital room?: A Little Help needed climbing 3-5 steps with a railing? : A Lot 6 Click Score: 17    End of Session Equipment Utilized During Treatment: Gait belt Activity Tolerance: Patient limited by fatigue Patient left: in bed;with call bell/phone within reach;with family/visitor present Nurse Communication: Mobility status PT Visit Diagnosis: Difficulty in walking, not elsewhere classified (R26.2)     Time: 2979-8921 PT Time Calculation (min) (ACUTE ONLY): 14 min  Charges:  $Gait Training: 8-22 mins                     Jannette Spanner PT, DPT Physical Therapist Acute Rehabilitation Services Preferred contact method: Secure Chat Weekend Pager Only: [phone] Office: [phone]    Kimberly Sellers [place] 06/12/2022, 4:02 PM

## 2022-06-12 NOTE — Progress Notes (Signed)
PROGRESS NOTE    Kimberly Sellers  YWV:[id number] DOB: [birth year] DOA: 06/08/2022 PCP: Cipriano Mile, NP    Brief Narrative:   Kimberly Sellers is a 77 y.o. female with past medical history of anxiety, osteoarthritis, type 2 diabetes, GERD, hypertension,  adrenal nodule, partial small bowel obstruction, history of supraventricular tachycardia was consulted for evaluation of bradycardia nausea and lightheadedness.  Patient was admitted by orthopedics team for osteoarthritis on the left knee and underwent computer-assisted left total knee arthroplasty on 06/08/2022.    Assessment and plan   Dizziness vertigo, nausea.  Could be multifactorial including postoperative situation, medications.  Vestibular examination was negative yesterday.  Empirically on meclizine  Noted de-escalation of narcotics and muscle relaxant today.  Hopefully that might improve her symptoms.    Sinus bradycardia Question vasovagal.  Telemetry without any significant arrhythmia.  2D echocardiogram.  Cardiology on board and no significant recommendations were done.     HTN (hypertension) Patient is on amlodipine, lisinopril and HCTZ.  Hold off  beta-blockers and nondihydropyridine CCB.     Normocytic anemia Latest hemoglobin 9.7.     DVT prophylaxis: SCDs Start: 06/08/22 1541   Code Status:     Code Status: Full Code  Disposition: Likely home as per primary team with home PT likely by tomorrow if symptoms improve..    Family Communication: Spoke with the patient's family at bedside.  Consultants:  Cardiology  Procedures:  2D echocardiogram  Antimicrobials:  None  Subjective: Today, patient was seen and examined at bedside.  Still has some dizziness.  Narcotics and muscle relaxant medications have been adjusted.  Vestibular exam was negative yesterday.  Patient's family at bedside.  Still complains of pain over the surgical site.  Objective: Vitals:   06/11/22 0930 06/11/22 1321 06/11/22 2207 06/12/22  0607  BP: (!) 108/58 (!) 115/98 121/67 134/68  Pulse:  87 85 80  Resp:  18 18 18   Temp:  99.2 F (37.3 C) 98.8 F (37.1 C) 98.4 F (36.9 C)  TempSrc:  Oral Oral   SpO2:  100%  100%  Weight:      Height:        Intake/Output Summary (Last 24 hours) at 06/12/2022 1106 Last data filed at 06/12/2022 1036 Gross per 24 hour  Intake 420 ml  Output 2850 ml  Net -2430 ml    Filed Weights   06/08/22 0701  Weight: 81.6 kg    Physical Examination: Body mass index is 28.19 kg/m.   General: Elderly female, not in obvious distress but appears uncomfortable, alert awake and Communicative.   HENT:   No scleral pallor or icterus noted. Oral mucosa is moist.  Chest:  Clear breath sounds.  Diminished breath sounds bilaterally. No crackles or wheezes.  CVS: S1 &S2 heard. No murmur.  Regular rate and rhythm. Abdomen: Soft, nontender, nondistended.  Bowel sounds are heard.   Extremities: Left knee immobilizer status post total left knee arthroplasty. Psych: Alert, awake and oriented, mildly anxious more CNS:  No cranial nerve deficits.  Power equal in all extremities.   Skin: Warm and dry.  Status post left knee surgery  Data Reviewed:   CBC: Recent Labs  Lab 06/09/22 0333 06/10/22 0323  WBC 11.7* 10.6*  HGB 9.3* 9.7*  HCT 29.5* 30.1*  MCV 83.3 80.9  PLT 254 260     Basic Metabolic Panel: Recent Labs  Lab 06/09/22 0333 06/09/22 1149  NA 141  --   K 3.9  --   CL  112*  --   CO2 24  --   GLUCOSE 112*  --   BUN 14  --   CREATININE 0.64  --   CALCIUM 8.0*  --   MG  --  1.9     Liver Function Tests: No results for input(s): "AST", "ALT", "ALKPHOS", "BILITOT", "PROT", "ALBUMIN" in the last 168 hours.   Radiology Studies: ECHOCARDIOGRAM COMPLETE  Result Date: 06/11/2022    ECHOCARDIOGRAM REPORT   Patient Name:   Kimberly Sellers Date of Exam: 06/11/2022 Medical Rec #:  [id number]         Height:       67.0 in Accession #:    [id number]        Weight:       180.0 lb Date  of Birth:  Jun 23, 1945          BSA:          1.934 m Patient Age:    76 years          BP:           118/56 mmHg Patient Gender: F                 HR:           78 bpm. Exam Location:  Inpatient Procedure: 2D Echo, Cardiac Doppler and Color Doppler Indications:    R94.31 Abnormal EKG  History:        Patient has prior history of Echocardiogram examinations, most                 recent 10/13/2020. Signs/Symptoms:Dyspnea; Risk Factors:Diabetes,                 Hypertension and GERD.  Sonographer:    Eulah Pont RDCS Referring Phys: [id number] DAVID MANUEL ORTIZ IMPRESSIONS  1. Left ventricular ejection fraction, by estimation, is 65 to 70%. The left ventricle has normal function. The left ventricle has no regional wall motion abnormalities. Left ventricular diastolic parameters are consistent with Grade I diastolic dysfunction (impaired relaxation).  2. Right ventricular systolic function is normal. The right ventricular size is normal. Tricuspid regurgitation signal is inadequate for assessing PA pressure.  3. Left atrial size was mildly dilated.  4. The mitral valve is normal in structure. Trivial mitral valve regurgitation. No evidence of mitral stenosis.  5. The aortic valve is tricuspid. Aortic valve regurgitation is trivial. Mild aortic valve stenosis.  6. The inferior vena cava is normal in size with greater than 50% respiratory variability, suggesting right atrial pressure of 3 mmHg. FINDINGS  Left Ventricle: Left ventricular ejection fraction, by estimation, is 65 to 70%. The left ventricle has normal function. The left ventricle has no regional wall motion abnormalities. The left ventricular internal cavity size was normal in size. There is  no left ventricular hypertrophy. Left ventricular diastolic parameters are consistent with Grade I diastolic dysfunction (impaired relaxation). Right Ventricle: The right ventricular size is normal. Right ventricular systolic function is normal. Tricuspid regurgitation  signal is inadequate for assessing PA pressure. The tricuspid regurgitant velocity is 2.47 m/s, and with an assumed right atrial  pressure of 3 mmHg, the estimated right ventricular systolic pressure is 27.4 mmHg. Left Atrium: Left atrial size was mildly dilated. Right Atrium: Right atrial size was normal in size. Pericardium: There is no evidence of pericardial effusion. Mitral Valve: The mitral valve is normal in structure. Trivial mitral valve regurgitation. No evidence of mitral valve stenosis. Tricuspid Valve: The tricuspid valve  is normal in structure. Tricuspid valve regurgitation is trivial. No evidence of tricuspid stenosis. Aortic Valve: The aortic valve is tricuspid. Aortic valve regurgitation is trivial. Aortic regurgitation PHT measures 393 msec. Mild aortic stenosis is present. Aortic valve mean gradient measures 9.0 mmHg. Aortic valve peak gradient measures 17.0 mmHg. Aortic valve area, by VTI measures 1.80 cm. Pulmonic Valve: The pulmonic valve was normal in structure. Pulmonic valve regurgitation is not visualized. No evidence of pulmonic stenosis. Aorta: The aortic root is normal in size and structure. Venous: The inferior vena cava is normal in size with greater than 50% respiratory variability, suggesting right atrial pressure of 3 mmHg. IAS/Shunts: No atrial level shunt detected by color flow Doppler.  LEFT VENTRICLE PLAX 2D LVIDd:         4.40 cm     Diastology LVIDs:         2.70 cm     LV e' medial:    7.72 cm/s LV PW:         0.90 cm     LV E/e' medial:  10.4 LV IVS:        0.90 cm     LV e' lateral:   11.20 cm/s LVOT diam:     2.00 cm     LV E/e' lateral: 7.1 LV SV:         77 LV SV Index:   40 LVOT Area:     3.14 cm  LV Volumes (MOD) LV vol d, MOD A2C: 89.8 ml LV vol d, MOD A4C: 94.2 ml LV vol s, MOD A2C: 31.1 ml LV vol s, MOD A4C: 30.9 ml LV SV MOD A2C:     58.7 ml LV SV MOD A4C:     94.2 ml LV SV MOD BP:      63.2 ml RIGHT VENTRICLE RV S prime:     13.70 cm/s TAPSE (M-mode): 2.0 cm  LEFT ATRIUM             Index        RIGHT ATRIUM           Index LA diam:        3.90 cm 2.02 cm/m   RA Area:     23.30 cm LA Vol (A2C):   72.7 ml 37.60 ml/m  RA Volume:   76.80 ml  39.72 ml/m LA Vol (A4C):   68.7 ml 35.53 ml/m LA Biplane Vol: 70.9 ml 36.67 ml/m  AORTIC VALVE AV Area (Vmax):    1.81 cm AV Area (Vmean):   1.84 cm AV Area (VTI):     1.80 cm AV Vmax:           206.00 cm/s AV Vmean:          142.000 cm/s AV VTI:            0.428 m AV Peak Grad:      17.0 mmHg AV Mean Grad:      9.0 mmHg LVOT Vmax:         119.00 cm/s LVOT Vmean:        83.300 cm/s LVOT VTI:          0.245 m LVOT/AV VTI ratio: 0.57 AI PHT:            393 msec  AORTA Ao Root diam: 2.90 cm Ao Asc diam:  3.70 cm MITRAL VALVE               TRICUSPID VALVE MV Area (PHT): 2.60  cm    TR Peak grad:   24.4 mmHg MV Decel Time: 292 msec    TR Vmax:        247.00 cm/s MV E velocity: 80.00 cm/s MV A velocity: 91.20 cm/s  SHUNTS MV E/A ratio:  0.88        Systemic VTI:  0.24 m                            Systemic Diam: 2.00 cm Kirk Ruths MD Electronically signed by Kirk Ruths MD Signature Date/Time: 06/11/2022/10:20:54 AM    Final       LOS: 2 days    Flora Lipps, MD [facility] Available via Epic secure chat 7am-7pm After these hours, please refer to coverage provider listed on [email or web address] 06/12/2022, 11:06 AM

## 2022-06-13 DIAGNOSIS — R001 Bradycardia, unspecified: Secondary | ICD-10-CM | POA: Diagnosis not present

## 2022-06-13 DIAGNOSIS — I159 Secondary hypertension, unspecified: Secondary | ICD-10-CM | POA: Diagnosis not present

## 2022-06-13 DIAGNOSIS — M1712 Unilateral primary osteoarthritis, left knee: Secondary | ICD-10-CM | POA: Diagnosis not present

## 2022-06-13 DIAGNOSIS — D649 Anemia, unspecified: Secondary | ICD-10-CM | POA: Diagnosis not present

## 2022-06-13 LAB — BASIC METABOLIC PANEL
Anion gap: 5 (ref 5–15)
BUN: 12 mg/dL (ref 8–23)
CO2: 29 mmol/L (ref 22–32)
Calcium: 8.5 mg/dL — ABNORMAL LOW (ref 8.9–10.3)
Chloride: 101 mmol/L (ref 98–111)
Creatinine, Ser: 0.64 mg/dL (ref 0.44–1.00)
GFR, Estimated: >60 mL/min (ref >60)
Glucose, Bld: 97 mg/dL (ref 70–99)
Potassium: 3.4 mmol/L — ABNORMAL LOW (ref 3.5–5.1)
Sodium: 135 mmol/L (ref 135–145)

## 2022-06-13 LAB — MAGNESIUM: Magnesium: 1.7 mg/dL (ref 1.7–2.4)

## 2022-06-13 LAB — CBC
HCT: 28.6 % — ABNORMAL LOW (ref 36.0–46.0)
Hemoglobin: 9.1 g/dL — ABNORMAL LOW (ref 12.0–15.0)
MCH: 26.3 pg (ref 26.0–34.0)
MCHC: 31.8 g/dL (ref 30.0–36.0)
MCV: 82.7 fL (ref 80.0–100.0)
Platelets: 327 10*3/uL (ref 150–400)
RBC: 3.46 MIL/uL — ABNORMAL LOW (ref 3.87–5.11)
RDW: 16 % — ABNORMAL HIGH (ref 11.5–15.5)
WBC: 9.3 10*3/uL (ref 4.0–10.5)
nRBC: 0 % (ref 0.0–0.2)

## 2022-06-13 LAB — GLUCOSE, CAPILLARY
Glucose-Capillary: 90 mg/dL (ref 70–99)
Glucose-Capillary: 91 mg/dL (ref 70–99)
Glucose-Capillary: 126 mg/dL — ABNORMAL HIGH (ref 70–99)
Glucose-Capillary: 123 mg/dL — ABNORMAL HIGH (ref 70–99)

## 2022-06-13 MED ORDER — HYDROCODONE-ACETAMINOPHEN 5-325 MG PO TABS: 1.0000 | ORAL_TABLET | ORAL | Status: DC | PRN

## 2022-06-13 MED ORDER — MECLIZINE HCL 25 MG PO TABS
25.0000 mg | ORAL_TABLET | Freq: Three times a day (TID) | ORAL | Status: DC
  Administered 2022-06-13 – 2022-06-17 (×13): 25 mg via ORAL
  Filled 2022-06-13 (×13): qty 1

## 2022-06-13 MED ORDER — DIPHENHYDRAMINE HCL 12.5 MG/5ML PO ELIX: 12.5000 mg | ORAL_SOLUTION | Freq: Once | ORAL | Status: DC

## 2022-06-13 MED ORDER — POTASSIUM CHLORIDE CRYS ER 20 MEQ PO TBCR
40.0000 meq | EXTENDED_RELEASE_TABLET | Freq: Once | ORAL | Status: AC
  Administered 2022-06-13: 40 meq via ORAL
  Filled 2022-06-13: qty 2

## 2022-06-13 MED ORDER — HYDROCODONE-ACETAMINOPHEN 7.5-325 MG PO TABS
1.0000 | ORAL_TABLET | ORAL | Status: DC | PRN
  Administered 2022-06-13 – 2022-06-14 (×2): 2 via ORAL
  Administered 2022-06-17: 1 via ORAL
  Administered 2022-06-17: 2 via ORAL
  Filled 2022-06-13 (×3): qty 2
  Filled 2022-06-13: qty 1

## 2022-06-13 MED ORDER — HYDROCODONE-ACETAMINOPHEN 5-325 MG PO TABS
1.0000 | ORAL_TABLET | ORAL | Status: DC | PRN
  Administered 2022-06-14 – 2022-06-16 (×10): 1 via ORAL
  Filled 2022-06-13 (×10): qty 1

## 2022-06-13 NOTE — Plan of Care (Signed)
  Problem: Education: Goal: Ability to describe self-care measures that may prevent or decrease complications (Diabetes Survival Skills Education) will improve Outcome: Progressing   Problem: Coping: Goal: Ability to adjust to condition or change in health will improve Outcome: Progressing   Problem: Metabolic: Goal: Ability to maintain appropriate glucose levels will improve Outcome: Progressing   

## 2022-06-13 NOTE — Progress Notes (Signed)
PROGRESS NOTE    Kimberly Sellers  NID:[id number] DOB: Apr 30, 1946 DOA: 06/08/2022 PCP: Cipriano Mile, NP    Brief Narrative:   Kimberly Sellers is a 77 y.o. female with past medical history of anxiety, osteoarthritis, type 2 diabetes, GERD, hypertension,  adrenal nodule, partial small bowel obstruction, history of supraventricular tachycardia was consulted for evaluation of bradycardia nausea and lightheadedness.  Patient was admitted by orthopedics team for osteoarthritis on the left knee and underwent computer-assisted left total knee arthroplasty on 06/08/2022.    Assessment and plan   Dizziness, vertigo, nausea.  Could be multifactorial including polypharmacy, volume depletion.  Vestibular examination was negative yesterday.  Empirically on meclizine did not receive yesterday so we will put 3 times daily for now to see the response.  Medication has been de-escalated especially muscle relaxants and narcotics.  Does not have any focal neurological deficits.  Still feel it is nonspecific but if persistent might need MRI of the brain.  Will check orthostatic vitals.  Sinus bradycardia Question vasovagal.  Telemetry without any significant arrhythmia.  2D echocardiogram with LV ejection fraction of 65 to 70% with grade 1 diastolic dysfunction..  Cardiology on board and no significant recommendations were done.     HTN (hypertension) Patient is on amlodipine, lisinopril and HCTZ.  Hold off  beta-blockers and nondihydropyridine CCB for now..     Normocytic anemia Latest hemoglobin 9. 1..     DVT prophylaxis: SCDs Start: 06/08/22 1541   Code Status:     Code Status: Full Code  Disposition: Likely home as per primary team with home PT.   Family Communication: Spoke with the patient's family at bedside.  Consultants:  Cardiology  Procedures:  2D echocardiogram  Antimicrobials:  None  Subjective: Today, patient was seen and examined at bedside.  States that she feels more dizzy  after having physical therapy.  Patient's family at bedside.  Denies any nausea or vomiting, fever, chills. nonspecific dizziness and family saying was more dizzy when she was having pain with  Objective: Vitals:   06/12/22 1343 06/12/22 2117 06/13/22 0542 06/13/22 1354  BP: 110/60 (!) 142/62 (!) 127/59 (!) 123/50  Pulse: 82 85 75 98  Resp: 20 17 17 17   Temp: 98.7 F (37.1 C) 98.9 F (37.2 C) 98.3 F (36.8 C) 98.4 F (36.9 C)  TempSrc: Oral Oral Oral   SpO2: 100% 98% 100% 100%  Weight:      Height:        Intake/Output Summary (Last 24 hours) at 06/13/2022 1403 Last data filed at 06/13/2022 0900 Gross per 24 hour  Intake 840 ml  Output 1800 ml  Net -960 ml    Filed Weights   06/08/22 0701  Weight: 81.6 kg    Physical Examination: Body mass index is 28.19 kg/m.   General: Elderly female, not in obvious distress, alert awake and Communicative.   HENT:   No scleral pallor or icterus noted. Oral mucosa is moist.  Chest:  Clear breath sounds.  Diminished breath sounds bilaterally. No crackles or wheezes.  CVS: S1 &S2 heard. No murmur.  Regular rate and rhythm. Abdomen: Soft, nontender, nondistended.  Bowel sounds are heard.   Extremities: Status post left knee arthroplasty with immobilizer. Psych: Alert, awake and oriented, mildly anxious. CNS:  No cranial nerve deficits.  Moves all extremities. Skin: Warm and dry.  Status post left knee surgery  Data Reviewed:   CBC: Recent Labs  Lab 06/09/22 0333 06/10/22 0323 06/13/22 0311  WBC 11.7*  10.6* 9.3  HGB 9.3* 9.7* 9.1*  HCT 29.5* 30.1* 28.6*  MCV 83.3 80.9 82.7  PLT 254 260 327     Basic Metabolic Panel: Recent Labs  Lab 06/09/22 0333 06/09/22 1149 06/13/22 0311  NA 141  --  135  K 3.9  --  3.4*  CL 112*  --  101  CO2 24  --  29  GLUCOSE 112*  --  97  BUN 14  --  12  CREATININE 0.64  --  0.64  CALCIUM 8.0*  --  8.5*  MG  --  1.9 1.7     Liver Function Tests: No results for input(s): "AST", "ALT",  "ALKPHOS", "BILITOT", "PROT", "ALBUMIN" in the last 168 hours.   Radiology Studies: No results found.    LOS: 3 days    Flora Lipps, MD [facility] Available via Epic secure chat 7am-7pm After these hours, please refer to coverage provider listed on [email or web address] 06/13/2022, 2:03 PM

## 2022-06-13 NOTE — Progress Notes (Signed)
Physical Therapy Treatment Patient Details Name: Kimberly Sellers MRN: [id number] DOB: May 30, 1945 Today's Date: 06/13/2022   History of Present Illness Pt is a 77 yo female presenting s/p L-TKA on 06/08/22. PMH: DM, GERD, HTN    PT Comments    Pt assisted with ambulating in hallway and performed a few exercise once in recliner.  Pt encouraged to use BSC or bathroom (instead of purewick) to encourage more movement throughout the day.  Pt remains dizziness at rest but appears to increase with mobility.  Pt reports dizziness worse today then yesterday.     Recommendations for follow up therapy are one component of a multi-disciplinary discharge planning process, led by the attending physician.  Recommendations may be updated based on patient status, additional functional criteria and insurance authorization.  Follow Up Recommendations  Follow physician's recommendations for discharge plan and follow up therapies     Assistance Recommended at Discharge Frequent or constant Supervision/Assistance  Patient can return home with the following A little help with walking and/or transfers;A little help with bathing/dressing/bathroom;Assistance with cooking/housework;Help with stairs or ramp for entrance;Assist for transportation   Equipment Recommendations  None recommended by PT    Recommendations for Other Services       Precautions / Restrictions Precautions Precautions: Fall;Knee Restrictions Weight Bearing Restrictions: No LLE Weight Bearing: Weight bearing as tolerated     Mobility  Bed Mobility Overal bed mobility: Needs Assistance Bed Mobility: Supine to Sit     Supine to sit: Min guard     General bed mobility comments: pt self assisted Lt LE and required increased time    Transfers Overall transfer level: Needs assistance Equipment used: Rolling walker (2 wheels) Transfers: Sit to/from Stand Sit to Stand: Min assist           General transfer comment: pt able  to position LEs however required cues for hand placement, assist to rise and steady    Ambulation/Gait Ambulation/Gait assistance: Min guard Gait Distance (Feet): 40 Feet Assistive device: Rolling walker (2 wheels) Gait Pattern/deviations: Step-to pattern, Antalgic, Decreased stance time - left Gait velocity: decreased     General Gait Details: cues for step length and posture, pt reports dizziness worse then yesterday but wanting to power through   [employer] Mobility    Modified Rankin (Stroke Patients Only)       Balance                                            Cognition Arousal/Alertness: Awake/alert Behavior During Therapy: WFL for tasks assessed/performed Overall Cognitive Status: Within Functional Limits for tasks assessed                                          Exercises Total Joint Exercises Ankle Circles/Pumps: AROM, Both, 10 reps Quad Sets: AROM, Left, 10 reps Heel Slides: Left, 10 reps, Seated, AROM    General Comments        Pertinent Vitals/Pain Pain Assessment Pain Assessment: 0-10 Pain Score: 8  Pain Location: L knee Pain Descriptors / Indicators: Sore, Aching Pain Intervention(s): Repositioned, Ice applied, Monitored during session    Home Living  Prior Function            PT Goals (current goals can now be found in the care plan section) Progress towards PT goals: Progressing toward goals    Frequency    7X/week      PT Plan Current plan remains appropriate    Co-evaluation              AM-PAC PT "6 Clicks" Mobility   Outcome Measure  Help needed turning from your back to your side while in a flat bed without using bedrails?: A Little Help needed moving from lying on your back to sitting on the side of a flat bed without using bedrails?: A Little Help needed moving to and from a bed to a chair (including a wheelchair)?:  A Little Help needed standing up from a chair using your arms (e.g., wheelchair or bedside chair)?: A Little Help needed to walk in hospital room?: A Little Help needed climbing 3-5 steps with a railing? : A Lot 6 Click Score: 17    End of Session Equipment Utilized During Treatment: Gait belt Activity Tolerance: Patient limited by fatigue (dizziness) Patient left: with call bell/phone within reach;with family/visitor present;in chair Nurse Communication: Mobility status PT Visit Diagnosis: Difficulty in walking, not elsewhere classified (R26.2);Pain Pain - Right/Left: Left Pain - part of body: Knee     Time: 8527-7824 PT Time Calculation (min) (ACUTE ONLY): 27 min  Charges:  $Gait Training: 8-22 mins $Therapeutic Exercise: 8-22 mins                     Jannette Spanner PT, DPT Physical Therapist [facility] Preferred contact method: Secure Chat Weekend Pager Only: [phone] Office: [place] 06/13/2022, 12:04 PM

## 2022-06-13 NOTE — Progress Notes (Signed)
Physical Therapy Treatment Patient Details Name: Kimberly Sellers MRN: [id number] DOB: 1945/10/07 Today's Date: 06/13/2022   History of Present Illness Pt is a 77 yo female presenting s/p L-TKA on 06/08/22. PMH: DM, GERD, HTN    PT Comments    Pt agreeable to mobilize to obtain orthostatic vitals (requested from Dr. Louanne Belton this morning however pt had already ambulated when he arrived to room).  Pt felt unable to ambulate after and requested return to bed due to dizziness and increased pain.   06/13/22 1459  Vital Signs  BP Location Right Arm  BP Method Automatic  Patient Position (if appropriate) Orthostatic Vitals  Orthostatic Lying   BP- Lying 137/68  Pulse- Lying 90  Orthostatic Sitting  BP- Sitting 137/68  Pulse- Sitting 88  Orthostatic Standing at 0 minutes  BP- Standing at 0 minutes 125/85  Pulse- Standing at 0 minutes 107  Orthostatic Standing at 3 minutes  BP- Standing at 3 minutes 128/76  Pulse- Standing at 3 minutes 110    Recommendations for follow up therapy are one component of a multi-disciplinary discharge planning process, led by the attending physician.  Recommendations may be updated based on patient status, additional functional criteria and insurance authorization.  Follow Up Recommendations  Follow physician's recommendations for discharge plan and follow up therapies     Assistance Recommended at Discharge Frequent or constant Supervision/Assistance  Patient can return home with the following A little help with walking and/or transfers;A little help with bathing/dressing/bathroom;Assistance with cooking/housework;Help with stairs or ramp for entrance;Assist for transportation   Equipment Recommendations  None recommended by PT    Recommendations for Other Services       Precautions / Restrictions Precautions Precautions: Fall;Knee Restrictions LLE Weight Bearing: Weight bearing as tolerated     Mobility  Bed Mobility Overal bed mobility:  Needs Assistance Bed Mobility: Supine to Sit, Sit to Supine     Supine to sit: Min guard     General bed mobility comments: pt self assisted Lt LE and required increased time    Transfers Overall transfer level: Needs assistance Equipment used: Rolling walker (2 wheels) Transfers: Sit to/from Stand Sit to Stand: Min assist           General transfer comment: pt able to position LEs however required cues for hand placement, assist to rise and steady    Ambulation/Gait Ambulation/Gait assistance: Min guard Gait Distance (Feet): 40 Feet Assistive device: Rolling walker (2 wheels) Gait Pattern/deviations: Step-to pattern, Antalgic, Decreased stance time - left Gait velocity: decreased     General Gait Details: cues for step length and posture, pt reports dizziness worse then yesterday but wanting to power through   [employer] Mobility    Modified Rankin (Stroke Patients Only)       Balance                                            Cognition Arousal/Alertness: Awake/alert Behavior During Therapy: WFL for tasks assessed/performed Overall Cognitive Status: Within Functional Limits for tasks assessed                                          Exercises     General  Comments        Pertinent Vitals/Pain Pain Assessment Pain Assessment: 0-10 Pain Score: 8  Pain Location: L knee Pain Descriptors / Indicators: Sore, Aching Pain Intervention(s): Monitored during session, Repositioned    Home Living                          Prior Function            PT Goals (current goals can now be found in the care plan section) Progress towards PT goals: Progressing toward goals    Frequency    7X/week      PT Plan Current plan remains appropriate    Co-evaluation              AM-PAC PT "6 Clicks" Mobility   Outcome Measure  Help needed turning from your back to your side while  in a flat bed without using bedrails?: A Little Help needed moving from lying on your back to sitting on the side of a flat bed without using bedrails?: A Little Help needed moving to and from a bed to a chair (including a wheelchair)?: A Little Help needed standing up from a chair using your arms (e.g., wheelchair or bedside chair)?: A Little Help needed to walk in hospital room?: A Little Help needed climbing 3-5 steps with a railing? : A Lot 6 Click Score: 17    End of Session Equipment Utilized During Treatment: Gait belt Activity Tolerance: Patient limited by pain (and dizziness) Patient left: with call bell/phone within reach;in bed Nurse Communication: Mobility status PT Visit Diagnosis: Difficulty in walking, not elsewhere classified (R26.2);Pain Pain - Right/Left: Left Pain - part of body: Knee     Time: 5697-9480 PT Time Calculation (min) (ACUTE ONLY): 17 min  Charges:   $Therapeutic Activity: 8-22 mins                    Jannette Spanner PT, DPT Physical Therapist Acute Rehabilitation Services Preferred contact method: Secure Chat Weekend Pager Only: [phone] Office: [place] 06/13/2022, 4:01 PM

## 2022-06-13 NOTE — Progress Notes (Signed)
    Subjective:  Patient reports pain as moderate.  She states she is still feeling dizzy but not as bad as yesterday. She does report nausea this morning with some stomach pain. She reports she has not had a bowel movement since Friday. V/CP/SOB. She denies any tingling or numbness in LE bilaterally.   Objective:   VITALS:   Vitals:   06/12/22 0607 06/12/22 1343 06/12/22 2117 06/13/22 0542  BP: 134/68 110/60 (!) 142/62 (!) 127/59  Pulse: 80 82 85 75  Resp: 18 20 17 17   Temp: 98.4 F (36.9 C) 98.7 F (37.1 C) 98.9 F (37.2 C) 98.3 F (36.8 C)  TempSrc:  Oral Oral Oral  SpO2: 100% 100% 98% 100%  Weight:      Height:        Patient lying in bed with cloth on her head. NAD Neurologically intact ABD soft Neurovascular intact Sensation intact distally Intact pulses distally Dorsiflexion/Plantar flexion intact Incision: dressing C/D/I No cellulitis present Compartment soft   Lab Results  Component Value Date   WBC 9.3 06/13/2022   HGB 9.1 (L) 06/13/2022   HCT 28.6 (L) 06/13/2022   MCV 82.7 06/13/2022   PLT 327 06/13/2022   BMET    Component Value Date/Time   NA 135 06/13/2022 0311   K 3.4 (L) 06/13/2022 0311   CL 101 06/13/2022 0311   CO2 29 06/13/2022 0311   GLUCOSE 97 06/13/2022 0311   BUN 12 06/13/2022 0311   CREATININE 0.64 06/13/2022 0311   CALCIUM 8.5 (L) 06/13/2022 0311   GFRNONAA >60 06/13/2022 0311     Assessment/Plan: 5 Days Post-Op   Principal Problem:   Osteoarthritis of left knee Active Problems:   HTN (hypertension)   Sinus bradycardia   Normocytic anemia   WBAT with walker DVT ppx: Aspirin, SCDs, TEDS PO pain control: Medication switched from oxycodone to hydrocodone yesterday. Patient reports increased pain, adjusted dosing this morning.  PT/OT: Patient ambulated 12 and 40 feet with PT yesterday. She endorsed improved dizziness but still present.  Dispo:  [facility] consulting with patient, appreciate their help. Patient started on  meclizine to see if it helps dizziness.    - Patient reports nausea this morning. Nurse notified. - Continue bowel regimen for constipation.   - Stopped methocarbamol and switched oxycodone to hydrocodone yesterday. Slight improvement with dizziness but increase in pain, dosing adjusted. From an orthopedic stand point patient can discharge home once cleared with PT and TRH.Continue incentive spirometry use 10x/hour. Medication has been sent to the pharmacy.       Charlott Rakes, PA-C 06/13/2022, 8:22 AM   [facility] [address], [place],  [address] Phone: [phone] [email or web address] Facebook  [employer]

## 2022-06-13 NOTE — Care Management Important Message (Signed)
Important Message  Patient Details IM Letter given. Name: Kimberly Sellers MRN: [id number] Date of Birth: 02/02/1946   Medicare Important Message Given:  Yes     Kerin Salen 06/13/2022, 3:21 PM

## 2022-06-14 ENCOUNTER — Inpatient Hospital Stay (HOSPITAL_COMMUNITY): Payer: 59

## 2022-06-14 DIAGNOSIS — M1712 Unilateral primary osteoarthritis, left knee: Secondary | ICD-10-CM | POA: Diagnosis not present

## 2022-06-14 DIAGNOSIS — I159 Secondary hypertension, unspecified: Secondary | ICD-10-CM | POA: Diagnosis not present

## 2022-06-14 DIAGNOSIS — R001 Bradycardia, unspecified: Secondary | ICD-10-CM | POA: Diagnosis not present

## 2022-06-14 DIAGNOSIS — D649 Anemia, unspecified: Secondary | ICD-10-CM | POA: Diagnosis not present

## 2022-06-14 LAB — GLUCOSE, CAPILLARY
Glucose-Capillary: 101 mg/dL — ABNORMAL HIGH (ref 70–99)
Glucose-Capillary: 99 mg/dL (ref 70–99)
Glucose-Capillary: 106 mg/dL — ABNORMAL HIGH (ref 70–99)
Glucose-Capillary: 108 mg/dL — ABNORMAL HIGH (ref 70–99)

## 2022-06-14 MED ORDER — POTASSIUM CHLORIDE CRYS ER 20 MEQ PO TBCR
40.0000 meq | EXTENDED_RELEASE_TABLET | Freq: Once | ORAL | Status: AC
  Administered 2022-06-14: 40 meq via ORAL
  Filled 2022-06-14: qty 2

## 2022-06-14 NOTE — Progress Notes (Signed)
Physical Therapy Treatment Patient Details Name: Kimberly Sellers MRN: [id number] DOB: May 09, 1946 Today's Date: 06/14/2022   History of Present Illness Pt is a 77 yo female presenting s/p L-TKA on 06/08/22. PMH: DM, GERD, HTN    PT Comments    Pt making good progress this afternoon with improved gait speed and sequencing, ambulated 100' with RW and min guard.  She does still have c/o dizziness but balance was stable.  Her pain control is adequate.  Pt to go for MRI this afternoon due to ongoing dizziness so was returned to bed.   Pt's daughter present and asking about HHPT instead of outpt PT - discussed that is handled by MD office, left daughter's number on board for MD to call and daughter left message with RN for MD to call.   Recommendations for follow up therapy are one component of a multi-disciplinary discharge planning process, led by the attending physician.  Recommendations may be updated based on patient status, additional functional criteria and insurance authorization.  Follow Up Recommendations  Follow physician's recommendations for discharge plan and follow up therapies     Assistance Recommended at Discharge Frequent or constant Supervision/Assistance  Patient can return home with the following A little help with walking and/or transfers;A little help with bathing/dressing/bathroom;Assistance with cooking/housework;Help with stairs or ramp for entrance;Assist for transportation   Equipment Recommendations  None recommended by PT    Recommendations for Other Services       Precautions / Restrictions Precautions Precautions: Fall Restrictions LLE Weight Bearing: Weight bearing as tolerated     Mobility  Bed Mobility Overal bed mobility: Needs Assistance Bed Mobility: Sit to Supine     Supine to sit: Supervision Sit to supine: Min assist   General bed mobility comments: Min A for L LE    Transfers Overall transfer level: Needs assistance Equipment used:  Rolling walker (2 wheels) Transfers: Sit to/from Stand Sit to Stand: Supervision           General transfer comment: Performed sit to stand from chair and bsc with supervision ; pt performed toielting ADLs on her own    Ambulation/Gait Ambulation/Gait assistance: Min guard Gait Distance (Feet): 100 Feet Assistive device: Rolling walker (2 wheels) Gait Pattern/deviations: Step-through pattern, Decreased weight shift to left Gait velocity: decreased     General Gait Details: Pt progressed to step through pattern with only slight decrease in weight shift to L.  Pt continues to report dizziness but demonstrated steady gait pattern   Stairs Stairs:  (declined trying stairs; reports looking into getting w/c so she can roll around to level entry door)           Wheelchair Mobility    Modified Rankin (Stroke Patients Only)       Balance Overall balance assessment: Needs assistance Sitting-balance support: Feet supported, No upper extremity supported Sitting balance-Leahy Scale: Good     Standing balance support: Single extremity supported, Bilateral upper extremity supported, Reliant on assistive device for balance Standing balance-Leahy Scale: Poor Standing balance comment: Requriing at least single UE support during ADLs; RW to ambulate                            Cognition Arousal/Alertness: Awake/alert Behavior During Therapy: WFL for tasks assessed/performed Overall Cognitive Status: Within Functional Limits for tasks assessed  Exercises Total Joint Exercises Ankle Circles/Pumps: AROM, Both, 10 reps Quad Sets: AROM, Left, 10 reps Heel Slides: AAROM, Left, 10 reps, Supine Hip ABduction/ADduction: AAROM, Left, 10 reps, Supine Long Arc Quad: AROM, Left, 10 reps, Seated (verbal, visual, and tacitle cues to prevent compensation techniques) Knee Flexion: AROM, Left, 10 reps, Seated (verbal, visual,  and tacitle cues to prevent compensation techniques) Goniometric ROM: L knee 5 to 50 degrees    General Comments General comments (skin integrity, edema, etc): Daughter present and asking about CPM and HHPT instead of outpt PT - discussed CPMs are ordered by physician and some do not use them, MD also determines HH vs outpt.  Daughter asked about ADL kit - showed ADL kit to order online or they can be purchased in gift shop.       Pertinent Vitals/Pain Pain Assessment Pain Assessment: Faces Faces Pain Scale: Hurts little more Pain Location: L knee Pain Descriptors / Indicators: Sore, Aching Pain Intervention(s): Limited activity within patient's tolerance, Monitored during session, Premedicated before session, Ice applied    Home Living                          Prior Function            PT Goals (current goals can now be found in the care plan section) Progress towards PT goals: Progressing toward goals    Frequency    7X/week      PT Plan Current plan remains appropriate    Co-evaluation              AM-PAC PT "6 Clicks" Mobility   Outcome Measure  Help needed turning from your back to your side while in a flat bed without using bedrails?: A Little Help needed moving from lying on your back to sitting on the side of a flat bed without using bedrails?: A Little Help needed moving to and from a bed to a chair (including a wheelchair)?: A Little Help needed standing up from a chair using your arms (e.g., wheelchair or bedside chair)?: A Little Help needed to walk in hospital room?: A Little Help needed climbing 3-5 steps with a railing? : A Lot 6 Click Score: 17    End of Session Equipment Utilized During Treatment: Gait belt Activity Tolerance: Patient tolerated treatment well Patient left: with call bell/phone within reach;in bed;with bed alarm set;with SCD's reapplied Nurse Communication: Mobility status PT Visit Diagnosis: Difficulty in walking,  not elsewhere classified (R26.2);Pain Pain - Right/Left: Left Pain - part of body: Knee     Time: 6222-9798 PT Time Calculation (min) (ACUTE ONLY): 27 min  Charges:  $Gait Training: 8-22 mins $Therapeutic Exercise: 8-22 mins                     Abran Richard, PT Acute Rehab [facility] [phone]    Karlton Lemon 06/14/2022, 4:24 PM

## 2022-06-14 NOTE — Progress Notes (Signed)
    Subjective:  Patient reports pain as moderate.  She currently denies N/V/CP/SOB/Abd pain. She reports her pain is 7/10 this morning. She is eating breakfast this morning. She states that she was still having some nausea yesterday but is better with the medication on board. She reports she had a small bowel movement yesterday. She reports that she is still feeling dizzy and "swimmy headed." She denies any tingling or numbness in LE bilaterally.   Objective:   VITALS:   Vitals:   06/13/22 0542 06/13/22 1354 06/13/22 2157 06/14/22 0550  BP: (!) 127/59 (!) 123/50 (!) 144/62 (!) 124/57  Pulse: 75 98 96 78  Resp: 17 17 17 16   Temp: 98.3 F (36.8 C) 98.4 F (36.9 C) 99.1 F (37.3 C) 97.8 F (36.6 C)  TempSrc: Oral  Oral Oral  SpO2: 100% 100% 98% 97%  Weight:      Height:        Patient sitting up in bed with cool cloth on her head. NAD.  Neurologically intact ABD soft Neurovascular intact Sensation intact distally Intact pulses distally Dorsiflexion/Plantar flexion intact Incision: dressing C/D/I No cellulitis present Compartment soft   Lab Results  Component Value Date   WBC 9.3 06/13/2022   HGB 9.1 (L) 06/13/2022   HCT 28.6 (L) 06/13/2022   MCV 82.7 06/13/2022   PLT 327 06/13/2022   BMET    Component Value Date/Time   NA 135 06/13/2022 0311   K 3.4 (L) 06/13/2022 0311   CL 101 06/13/2022 0311   CO2 29 06/13/2022 0311   GLUCOSE 97 06/13/2022 0311   BUN 12 06/13/2022 0311   CREATININE 0.64 06/13/2022 0311   CALCIUM 8.5 (L) 06/13/2022 0311   GFRNONAA >60 06/13/2022 0311     Assessment/Plan: 6 Days Post-Op   Principal Problem:   Osteoarthritis of left knee Active Problems:   HTN (hypertension)   Sinus bradycardia   Normocytic anemia   WBAT with walker DVT ppx: Aspirin, SCDs, TEDS PO pain control PT/OT Dispo:  [facility] consulting with patient, appreciate their help. Patient started on meclizine to see if it helps dizziness.  - Patient reports  improved nausea this morning with the medication. - IV restarted today as patient has not had much oral intake. Encouraged her to increase PO intake. She is eating breakfast this morning.  - From an orthopedic stand point patient can discharge home once cleared with PT and TRH.Continue incentive spirometry use 10x/hour. Medication has been sent to the pharmacy.     Charlott Rakes, PA-C 06/14/2022, 9:04 AM   [facility]  [facility] [address], [address], [place], [place] [address] Phone: [phone] [email or web address] Facebook  [employer]

## 2022-06-14 NOTE — Progress Notes (Signed)
Physical Therapy Treatment Patient Details Name: Kimberly Sellers MRN: [id number] DOB: [birth year] Today's Date: 06/14/2022   History of Present Illness Pt is a 77 yo female presenting s/p L-TKA on 06/08/22. PMH: DM, GERD, HTN    PT Comments    Pt with gradual progress.  She reports still dizzy with no significant changes - suspect medication related (pt reports increases about 15 mins after pain meds but then calms unless she is moving).  Noted that orthostatic bp was negative and vestibular screen was negative in prior sessions.   She is progressing with her mobility despite dizziness.  She was able to ambualte 62' with RW and min guard.  Hopeful for progression to stairs this afternoon.  Barrier to d/c is that pt reports may not have supervision most of the time as her children work. Continue to progress with therapy.   Recommendations for follow up therapy are one component of a multi-disciplinary discharge planning process, led by the attending physician.  Recommendations may be updated based on patient status, additional functional criteria and insurance authorization.  Follow Up Recommendations  Follow physician's recommendations for discharge plan and follow up therapies     Assistance Recommended at Discharge Frequent or constant Supervision/Assistance  Patient can return home with the following A little help with walking and/or transfers;A little help with bathing/dressing/bathroom;Assistance with cooking/housework;Help with stairs or ramp for entrance;Assist for transportation   Equipment Recommendations  None recommended by PT    Recommendations for Other Services       Precautions / Restrictions Precautions Precautions: Fall Restrictions LLE Weight Bearing: Weight bearing as tolerated     Mobility  Bed Mobility Overal bed mobility: Needs Assistance Bed Mobility: Supine to Sit     Supine to sit: Supervision     General bed mobility comments: pt self assisted Lt LE  and required increased time    Transfers Overall transfer level: Needs assistance Equipment used: Rolling walker (2 wheels) Transfers: Sit to/from Stand Sit to Stand: Supervision           General transfer comment: Performed sit to stand from elevated bed, bench, and bsc with supervision to min guard and increased time; pt performed adls from toilet with supervision    Ambulation/Gait Ambulation/Gait assistance: Min guard Gait Distance (Feet): 60 Feet Assistive device: Rolling walker (2 wheels) Gait Pattern/deviations: Step-to pattern, Decreased stride length Gait velocity: decreased     General Gait Details: Cues for posture and RW proximity; reports dizziness but was stable and wanted to continue walking   Stairs             Wheelchair Mobility    Modified Rankin (Stroke Patients Only)       Balance Overall balance assessment: Needs assistance Sitting-balance support: Feet supported, No upper extremity supported Sitting balance-Leahy Scale: Good     Standing balance support: Single extremity supported, Bilateral upper extremity supported, Reliant on assistive device for balance Standing balance-Leahy Scale: Poor Standing balance comment: Requriing at least single UE support during ADLs; RW to ambulate                            Cognition Arousal/Alertness: Awake/alert   Overall Cognitive Status: Within Functional Limits for tasks assessed  Exercises Total Joint Exercises Ankle Circles/Pumps: AROM, Both, 10 reps Quad Sets: AROM, Left, 10 reps Heel Slides: AAROM, Left, 10 reps, Supine Hip ABduction/ADduction: AAROM, Left, 10 reps, Supine Long Arc Quad: AROM, Left, 10 reps, Seated (verbal, visual, and tacitle cues to prevent compensation techniques) Knee Flexion: AROM, Left, 10 reps, Seated (verbal, visual, and tacitle cues to prevent compensation techniques) Goniometric ROM: L knee  5 to 50 degrees    General Comments General comments (skin integrity, edema, etc.): Pt reports dizziness similar to past.  Describes as wooziness at most time but does increase with head turns and walking. Does report seems to increase after pain meds.  Does not describe as spinning or syncopal.  Noted orthostatic BP and vestibular have been negative.      Pertinent Vitals/Pain Pain Assessment Pain Assessment: Faces Faces Pain Scale: Hurts little more Pain Location: L knee Pain Descriptors / Indicators: Sore, Aching Pain Intervention(s): Limited activity within patient's tolerance, Repositioned, Monitored during session, Premedicated before session    Home Living                          Prior Function            PT Goals (current goals can now be found in the care plan section) Progress towards PT goals: Progressing toward goals    Frequency    7X/week      PT Plan Current plan remains appropriate    Co-evaluation              AM-PAC PT "6 Clicks" Mobility   Outcome Measure  Help needed turning from your back to your side while in a flat bed without using bedrails?: A Little Help needed moving from lying on your back to sitting on the side of a flat bed without using bedrails?: A Little Help needed moving to and from a bed to a chair (including a wheelchair)?: A Little Help needed standing up from a chair using your arms (e.g., wheelchair or bedside chair)?: A Little Help needed to walk in hospital room?: A Little Help needed climbing 3-5 steps with a railing? : A Lot 6 Click Score: 17    End of Session Equipment Utilized During Treatment: Gait belt Activity Tolerance: Other (comment) (Limited by dizziness) Patient left: with call bell/phone within reach;in chair;with chair alarm set Nurse Communication: Mobility status PT Visit Diagnosis: Difficulty in walking, not elsewhere classified (R26.2);Pain Pain - Right/Left: Left Pain - part of body:  Knee     Time: 1203-1233 PT Time Calculation (min) (ACUTE ONLY): 30 min  Charges:  $Gait Training: 8-22 mins $Therapeutic Exercise: 8-22 mins                     Abran Richard, PT Acute Rehab [facility] [phone]    Karlton Lemon 06/14/2022, 1:13 PM

## 2022-06-14 NOTE — Care Plan (Signed)
Patient prioritized for MRI ASAP given pending discharge status. Several STAT MRI exams ordered before hers but MRI will be done as soon as possible.

## 2022-06-14 NOTE — Progress Notes (Addendum)
PROGRESS NOTE    Kimberly Sellers  DDU:[id number] DOB: [birth year] DOA: 06/08/2022 PCP: Cipriano Mile, NP    Brief Narrative:   Kimberly Sellers is a 77 y.o. female with past medical history of anxiety, osteoarthritis, type 2 diabetes, GERD, hypertension,  adrenal nodule, partial small bowel obstruction, history of supraventricular tachycardia was consulted for evaluation of bradycardia nausea and lightheadedness.  Patient was admitted by orthopedics team for osteoarthritis on the left knee and underwent computer-assisted left total knee arthroplasty on 06/08/2022.  Medical team was consulted for bradycardia and dizziness.  Assessment and plan   Dizziness, vertigo, nausea.  Thought to be multifactorial including polypharmacy, volume depletion.  However continues to have dizziness despite being on meclizine and addressing polypharmacy.  She also complains of some tinnitus.  At this time unsure of vestibular/central etiology.  PT with vestibular evaluation for BPPV was negative.  Will obtain MRI of the brain to assess for intracranial/secondary pathology.  Will continue meclizine 3 times daily for now.  Patient feels symptomatic and does not feel ready for discharge.  Orthostatic vitals were obtained and patient does not have orthostatic hypotension.  If MRI is negative, patient could be discharged home with outpatient PCP follow-up, would recommend meclizine for next few days with decreased narcotics.  Sinus bradycardia Question vasovagal.  Telemetry without any significant arrhythmia.  2D echocardiogram with LV ejection fraction of 65 to 70% with grade 1 diastolic dysfunction..  Cardiology on board and no significant recommendations were done.  Heart rate has improved at this time.  Avoid nodal blockers.     HTN (hypertension) Patient is on amlodipine, lisinopril and HCTZ.  Hold off  beta-blockers and nondihydropyridine CCB for now.     Normocytic anemia Latest hemoglobin 9. 1.  Mild  hypokalemia.  Potassium of 3.4.  Will replace orally.    DVT prophylaxis: SCDs Start: 06/08/22 1541   Code Status:     Code Status: Full Code  Disposition: Likely home as per primary team, with home PT.   Family Communication: Spoke with the patient's family at bedside on 06/13/2022.  Consultants:  Cardiology  Procedures:  2D echocardiogram  Antimicrobials:  None  Subjective: Today, patient was seen and examined at bedside.  Still feels dizzy mostly after ambulation and even at rest.  Also complains of some tinnitus.  Denies any nausea vomiting or abdominal pain.  Objective: Vitals:   06/13/22 0542 06/13/22 1354 06/13/22 2157 06/14/22 0550  BP: (!) 127/59 (!) 123/50 (!) 144/62 (!) 124/57  Pulse: 75 98 96 78  Resp: 17 17 17 16   Temp: 98.3 F (36.8 C) 98.4 F (36.9 C) 99.1 F (37.3 C) 97.8 F (36.6 C)  TempSrc: Oral  Oral Oral  SpO2: 100% 100% 98% 97%  Weight:      Height:        Intake/Output Summary (Last 24 hours) at 06/14/2022 1126 Last data filed at 06/14/2022 0700 Gross per 24 hour  Intake 930 ml  Output --  Net 930 ml    Filed Weights   06/08/22 0701  Weight: 81.6 kg    Physical Examination: Body mass index is 28.19 kg/m.   General: Elderly female, not in obvious distress but mildly anxious, alert awake and Communicative HENT:   No scleral pallor or icterus noted. Oral mucosa is moist.  Chest:  Clear breath sounds.  Diminished breath sounds bilaterally. No crackles or wheezes.  CVS: S1 &S2 heard. No murmur.  Regular rate and rhythm. Abdomen: Soft, nontender, nondistended.  Bowel sounds are heard.   Extremities: status post left knee arthroplasty with immobilizer.   Psych: Alert, awake and oriented, mildly anxious. CNS:  No cranial nerve deficits.Status post left knee surgery. Skin: Warm and dry.  Status post left knee surgery.  Data Reviewed:   CBC: Recent Labs  Lab 06/09/22 0333 06/10/22 0323 06/13/22 0311  WBC 11.7* 10.6* 9.3  HGB 9.3*  9.7* 9.1*  HCT 29.5* 30.1* 28.6*  MCV 83.3 80.9 82.7  PLT 254 260 327     Basic Metabolic Panel: Recent Labs  Lab 06/09/22 0333 06/09/22 1149 06/13/22 0311  NA 141  --  135  K 3.9  --  3.4*  CL 112*  --  101  CO2 24  --  29  GLUCOSE 112*  --  97  BUN 14  --  12  CREATININE 0.64  --  0.64  CALCIUM 8.0*  --  8.5*  MG  --  1.9 1.7     Liver Function Tests: No results for input(s): "AST", "ALT", "ALKPHOS", "BILITOT", "PROT", "ALBUMIN" in the last 168 hours.   Radiology Studies: No results found.    LOS: 4 days    Flora Lipps, MD [facility] Available via Epic secure chat 7am-7pm After these hours, please refer to coverage provider listed on [email or web address] 06/14/2022, 11:26 AM

## 2022-06-15 DIAGNOSIS — R001 Bradycardia, unspecified: Secondary | ICD-10-CM | POA: Diagnosis not present

## 2022-06-15 DIAGNOSIS — D649 Anemia, unspecified: Secondary | ICD-10-CM | POA: Diagnosis not present

## 2022-06-15 DIAGNOSIS — M1712 Unilateral primary osteoarthritis, left knee: Secondary | ICD-10-CM | POA: Diagnosis not present

## 2022-06-15 DIAGNOSIS — I159 Secondary hypertension, unspecified: Secondary | ICD-10-CM | POA: Diagnosis not present

## 2022-06-15 LAB — BASIC METABOLIC PANEL
Anion gap: 4 — ABNORMAL LOW (ref 5–15)
BUN: 14 mg/dL (ref 8–23)
CO2: 28 mmol/L (ref 22–32)
Calcium: 8.9 mg/dL (ref 8.9–10.3)
Chloride: 106 mmol/L (ref 98–111)
Creatinine, Ser: 0.61 mg/dL (ref 0.44–1.00)
GFR, Estimated: >60 mL/min (ref >60)
Glucose, Bld: 103 mg/dL — ABNORMAL HIGH (ref 70–99)
Potassium: 4.8 mmol/L (ref 3.5–5.1)
Sodium: 138 mmol/L (ref 135–145)

## 2022-06-15 LAB — MAGNESIUM: Magnesium: 1.9 mg/dL (ref 1.7–2.4)

## 2022-06-15 LAB — GLUCOSE, CAPILLARY
Glucose-Capillary: 93 mg/dL (ref 70–99)
Glucose-Capillary: 107 mg/dL — ABNORMAL HIGH (ref 70–99)
Glucose-Capillary: 115 mg/dL — ABNORMAL HIGH (ref 70–99)
Glucose-Capillary: 124 mg/dL — ABNORMAL HIGH (ref 70–99)

## 2022-06-15 LAB — CBC
HCT: 29.2 % — ABNORMAL LOW (ref 36.0–46.0)
Hemoglobin: 9.1 g/dL — ABNORMAL LOW (ref 12.0–15.0)
MCH: 26.1 pg (ref 26.0–34.0)
MCHC: 31.2 g/dL (ref 30.0–36.0)
MCV: 83.7 fL (ref 80.0–100.0)
Platelets: 387 10*3/uL (ref 150–400)
RBC: 3.49 MIL/uL — ABNORMAL LOW (ref 3.87–5.11)
RDW: 16.1 % — ABNORMAL HIGH (ref 11.5–15.5)
WBC: 9.9 10*3/uL (ref 4.0–10.5)
nRBC: 0 % (ref 0.0–0.2)

## 2022-06-15 NOTE — Progress Notes (Signed)
PROGRESS NOTE    Kimberly Sellers  VVO:[id number] DOB: Jun 29, 1945 DOA: 06/08/2022 PCP: Cipriano Mile, NP    Brief Narrative:   Kimberly Sellers is a 77 y.o. female with past medical history of anxiety, osteoarthritis, type 2 diabetes, GERD, hypertension,  adrenal nodule, partial small bowel obstruction, history of supraventricular tachycardia was consulted for evaluation of bradycardia nausea and lightheadedness.  Patient was admitted by orthopedics team for osteoarthritis on the left knee and underwent computer-assisted left total knee arthroplasty on 06/08/2022.  Medical team was consulted for bradycardia and dizziness.  Assessment and plan   Dizziness, vertigo, nausea.  Thought to be multifactorial including polypharmacy, volume depletion.  MRI of the brain was done to rule out vestibular basilar issues but was negative for acute findings.  Patient still with some degree of dizziness..  PT with vestibular evaluation for BPPV was negative.    Orthostatic vitals were obtained and patient does not have orthostatic hypotension.  At this time patient has nonspecific dizziness.  Please try to avoid polypharmacy with continued outpatient physical therapy on discharge.  Would continue meclizine for a few more days on discharge.  Sinus bradycardia Question vasovagal.  Telemetry without any significant arrhythmia.  2D echocardiogram with LV ejection fraction of 65 to 70% with grade 1 diastolic dysfunction..  Cardiology was consulted without any specific recommendations.   Heart rate has improved at this time.  Avoid nodal blockers.     HTN (hypertension) Patient is on amlodipine, lisinopril and HCTZ.  Latest blood pressure of 128/61     Normocytic anemia Latest hemoglobin 9. 1.  Mild hypokalemia.  Potassium of 4.1 and has resolved.    DVT prophylaxis: SCDs Start: 06/08/22 1541   Code Status:     Code Status: Full Code  Disposition: Likely home with home health as per primary team.  Medically  stable for disposition.  Will sign off.  Please reconsult for any ongoing issues.   Family Communication: Spoke with the patient's family at bedside on 06/13/2022.  Consultants:  Cardiology  Procedures:  2D echocardiogram  Antimicrobials:  None  Subjective: Today, he was seen and examined at bedside.  Still has mild dizziness but overall improved.    Objective: Vitals:   06/14/22 1429 06/14/22 2130 06/15/22 0522 06/15/22 1329  BP: (!) 133/58 135/66 (!) 138/59 128/61  Pulse: 84 87 90 93  Resp: 17 18 17 18   Temp: 98 F (36.7 C) 98.4 F (36.9 C) 98.8 F (37.1 C) 98.8 F (37.1 C)  TempSrc: Oral Oral Oral   SpO2: 98% 99% 95% 100%  Weight:      Height:        Intake/Output Summary (Last 24 hours) at 06/15/2022 1411 Last data filed at 06/15/2022 0600 Gross per 24 hour  Intake 420 ml  Output --  Net 420 ml    Filed Weights   06/08/22 0701  Weight: 81.6 kg    Physical Examination: Body mass index is 28.19 kg/m.   General: Elderly female, not in obvious distress but mildly anxious, alert awake and Communicative HENT:   No scleral pallor or icterus noted. Oral mucosa is moist.  Chest:  Clear breath sounds.  Diminished breath sounds bilaterally. No crackles or wheezes.  CVS: S1 &S2 heard. No murmur.  Regular rate and rhythm. Abdomen: Soft, nontender, nondistended.  Bowel sounds are heard.   Extremities: status post left knee arthroplasty with immobilizer.   Psych: Alert, awake and oriented, mildly anxious. CNS:  No cranial nerve deficits.Status post  left knee surgery. Skin: Warm and dry.  Status post left knee surgery.  Data Reviewed:   CBC: Recent Labs  Lab 06/09/22 0333 06/10/22 0323 06/13/22 0311 06/15/22 0329  WBC 11.7* 10.6* 9.3 9.9  HGB 9.3* 9.7* 9.1* 9.1*  HCT 29.5* 30.1* 28.6* 29.2*  MCV 83.3 80.9 82.7 83.7  PLT 254 260 327 387     Basic Metabolic Panel: Recent Labs  Lab 06/09/22 0333 06/09/22 1149 06/13/22 0311 06/15/22 0329  NA 141  --   135 138  K 3.9  --  3.4* 4.8  CL 112*  --  101 106  CO2 24  --  29 28  GLUCOSE 112*  --  97 103*  BUN 14  --  12 14  CREATININE 0.64  --  0.64 0.61  CALCIUM 8.0*  --  8.5* 8.9  MG  --  1.9 1.7 1.9     Liver Function Tests: No results for input(s): "AST", "ALT", "ALKPHOS", "BILITOT", "PROT", "ALBUMIN" in the last 168 hours.   Radiology Studies: MR BRAIN WO CONTRAST  Result Date: 06/14/2022 CLINICAL DATA:  Vertigo EXAM: MRI HEAD WITHOUT CONTRAST TECHNIQUE: Multiplanar, multiecho pulse sequences of the brain and surrounding structures were obtained without intravenous contrast. COMPARISON:  None Available. FINDINGS: Brain: No acute infarct, mass effect or extra-axial collection. No chronic microhemorrhage or siderosis. Normal white matter signal, parenchymal volume and CSF spaces. The midline structures are normal. Vascular: Normal flow voids. Skull and upper cervical spine: Normal marrow signal. Sinuses/Orbits: Negative. Other: None. IMPRESSION: Normal MRI of the brain. Electronically Signed   By: Ulyses Jarred M.D.   On: 06/14/2022 18:45      LOS: 5 days    Flora Lipps, MD [facility] Available via Epic secure chat 7am-7pm After these hours, please refer to coverage provider listed on [email or web address] 06/15/2022, 2:11 PM

## 2022-06-15 NOTE — Progress Notes (Signed)
Physical Therapy Treatment Patient Details Name: Kimberly Sellers MRN: [id number] DOB: 03-24-1946 Today's Date: 06/15/2022   History of Present Illness Pt is a 77 yo female presenting s/p L-TKA on 06/08/22. PMH: DM, GERD, HTN    PT Comments    Pt is POD # 7 and is progressing well.  She is able to transfer with supervision level and ambulated 100' with RW and supervision.  She also performed 2 steps with rails and min guard.  She does have limited knee flexion due to pain.  Pt continues to have c/o dizziness but demonstrates safe and steady gait and transfers.  All testing (orthostatic, vestibular, MRI) has been negative - suspect medication related.  Pt demonstrates safe gait & transfers in order to return home from PT perspective once discharged by MD.  While in hospital, will continue to benefit from PT for skilled therapy to advance mobility and exercises.      Recommendations for follow up therapy are one component of a multi-disciplinary discharge planning process, led by the attending physician.  Recommendations may be updated based on patient status, additional functional criteria and insurance authorization.  Follow Up Recommendations  Follow physician's recommendations for discharge plan and follow up therapies     Assistance Recommended at Discharge Frequent or constant Supervision/Assistance  Patient can return home with the following A little help with walking and/or transfers;A little help with bathing/dressing/bathroom;Assistance with cooking/housework;Help with stairs or ramp for entrance;Assist for transportation   Equipment Recommendations  None recommended by PT    Recommendations for Other Services       Precautions / Restrictions Precautions Precautions: Fall Restrictions LLE Weight Bearing: Weight bearing as tolerated     Mobility  Bed Mobility Overal bed mobility: Needs Assistance Bed Mobility: Supine to Sit     Supine to sit: Supervision     General  bed mobility comments: in chair    Transfers Overall transfer level: Needs assistance Equipment used: Rolling walker (2 wheels) Transfers: Sit to/from Stand Sit to Stand: Supervision           General transfer comment: Min cues for L LE managment and hand placement but no physical assist    Ambulation/Gait Ambulation/Gait assistance: Supervision Gait Distance (Feet): 100 Feet Assistive device: Rolling walker (2 wheels) Gait Pattern/deviations: Step-through pattern, Decreased weight shift to left, Narrow base of support Gait velocity: decreased     General Gait Details: Pt progressed to step through pattern with only slight decrease in weight shift to L.  Pt with genu valgus bil and narrow BOS.  Pt continues to report dizziness but demonstrated steady gait pattern.  Did cue for focus points and segmental turns that helped somewhat with dizziness   Stairs Stairs: Yes Stairs assistance: Min guard Stair Management: Two rails, Step to pattern Number of Stairs: 2 General stair comments: Pt able to perform stairs with "up with good and down with bad" but reports her rails are not has well positioned/sturdy as ours.  She does have option for w/c to level entry   Wheelchair Mobility    Modified Rankin (Stroke Patients Only)       Balance Overall balance assessment: Needs assistance Sitting-balance support: Feet supported, No upper extremity supported Sitting balance-Leahy Scale: Good     Standing balance support: Single extremity supported, Bilateral upper extremity supported, Reliant on assistive device for balance Standing balance-Leahy Scale: Poor Standing balance comment: Requriing at least single UE support during ADLs; RW to ambulate  Cognition Arousal/Alertness: Awake/alert Behavior During Therapy: WFL for tasks assessed/performed Overall Cognitive Status: Within Functional Limits for tasks assessed                                           Exercises Total Joint Exercises Ankle Circles/Pumps: AROM, Both, 10 reps Quad Sets: AROM, Left, 10 reps Heel Slides: AAROM, Left, 10 reps, Supine Hip ABduction/ADduction: AAROM, Left, 10 reps, Supine Long Arc Quad: AROM, Left, 10 reps, Seated (verbal, visual, and tacitle cues to prevent compensation techniques) Knee Flexion: AROM, Left, 10 reps, Seated (verbal, visual, and tacitle cues to prevent compensation techniques) Goniometric ROM: L knee 5 to 50 degrees limited by pain    General Comments        Pertinent Vitals/Pain Pain Assessment Pain Assessment: 0-10 Pain Score: 6  Pain Location: L knee Pain Descriptors / Indicators: Sore, Aching Pain Intervention(s): Monitored during session, Limited activity within patient's tolerance, Premedicated before session, Repositioned, Ice applied    Home Living                          Prior Function            PT Goals (current goals can now be found in the care plan section) Progress towards PT goals: Progressing toward goals    Frequency    7X/week      PT Plan Current plan remains appropriate    Co-evaluation              AM-PAC PT "6 Clicks" Mobility   Outcome Measure  Help needed turning from your back to your side while in a flat bed without using bedrails?: A Little Help needed moving from lying on your back to sitting on the side of a flat bed without using bedrails?: A Little Help needed moving to and from a bed to a chair (including a wheelchair)?: A Little Help needed standing up from a chair using your arms (e.g., wheelchair or bedside chair)?: A Little Help needed to walk in hospital room?: A Little Help needed climbing 3-5 steps with a railing? : A Little 6 Click Score: 18    End of Session Equipment Utilized During Treatment: Gait belt Activity Tolerance: Patient tolerated treatment well Patient left: with call bell/phone within reach;with chair alarm  set;in chair;with family/visitor present Nurse Communication: Mobility status PT Visit Diagnosis: Difficulty in walking, not elsewhere classified (R26.2);Pain Pain - Right/Left: Left Pain - part of body: Knee     Time: 5400-8676 PT Time Calculation (min) (ACUTE ONLY): 18 min  Charges:  $Gait Training: 8-22 mins                    Abran Richard, PT Acute Rehab [facility] [place] 06/15/2022, 5:42 PM

## 2022-06-15 NOTE — Progress Notes (Signed)
    Subjective:  Patient reports pain as mild to moderate.  Currently denies N/V/CP/SOB/Abd pain. She states she is still feeling some dizziness but it is improved since yesterday. She reports she did well with PT yesterday. She states having the nausea medication on board has really helped. She denies any tingling or numbness in LE bilaterally.   Objective:   VITALS:   Vitals:   06/14/22 0550 06/14/22 1429 06/14/22 2130 06/15/22 0522  BP: (!) 124/57 (!) 133/58 135/66 (!) 138/59  Pulse: 78 84 87 90  Resp: 16 17 18 17   Temp: 97.8 F (36.6 C) 98 F (36.7 C) 98.4 F (36.9 C) 98.8 F (37.1 C)  TempSrc: Oral Oral Oral Oral  SpO2: 97% 98% 99% 95%  Weight:      Height:        Patient sitting up comfortably in bed. No cloth on head this morning. NAD.  Neurologically intact ABD soft Neurovascular intact Sensation intact distally Intact pulses distally Dorsiflexion/Plantar flexion intact Incision: dressing C/D/I No cellulitis present Compartment soft   Lab Results  Component Value Date   WBC 9.9 06/15/2022   HGB 9.1 (L) 06/15/2022   HCT 29.2 (L) 06/15/2022   MCV 83.7 06/15/2022   PLT 387 06/15/2022   BMET    Component Value Date/Time   NA 138 06/15/2022 0329   K 4.8 06/15/2022 0329   CL 106 06/15/2022 0329   CO2 28 06/15/2022 0329   GLUCOSE 103 (H) 06/15/2022 0329   BUN 14 06/15/2022 0329   CREATININE 0.61 06/15/2022 0329   CALCIUM 8.9 06/15/2022 0329   GFRNONAA >60 06/15/2022 0329     Assessment/Plan: 7 Days Post-Op   Principal Problem:   Osteoarthritis of left knee Active Problems:   HTN (hypertension)   Sinus bradycardia   Normocytic anemia   WBAT with walker DVT ppx: Aspirin, SCDs, TEDS PO pain control PT/OT: Patient ambulated 100 feet with PT yesterday. She reported dizziness but her balance was stable.  Dispo:  [facility] consulting with patient, appreciate their help. Patient started on meclizine to see if it helps dizziness. MRI of her head  yesterday.  - Patient reports improved nausea this morning with the medication. - Encouraged her to increase PO intake.  - From an orthopedic stand point patient can discharge home once cleared with PT and TRH.Continue incentive spirometry use 10x/hour. Medication has been sent to the pharmacy.     Charlott Rakes, PA-C 06/15/2022, 7:47 AM   [facility]  [place] [address], [place], [place] [address] Phone: [phone] [email or web address]  [employer]

## 2022-06-15 NOTE — Progress Notes (Signed)
Physical Therapy Treatment Patient Details Name: Kimberly Sellers MRN: [id number] DOB: [birth year] Today's Date: 06/15/2022   History of Present Illness Pt is a 77 yo female presenting s/p L-TKA on 06/08/22. PMH: DM, GERD, HTN    PT Comments    Pt continues to c/o dizziness and nausea.  Orthostatic bp remain stable and MRI negative for acute changes.  Did re-examine vestibular screen today and pt with dizziness with all movement (BPPV testing, head turns, transfers, walking) but still no nystagmus, vestibular screen negative.  Suspect medication related as pt reports does not typically take pain meds and also c/o nausea.    Despite complaints of dizziness, pt is ambulating and transferring safely with RW and supervision without unsteadiness or loss of balance. She ambulated 100' with RW and supervision.  Pt had reported she would not have 24 hr care as her children have jobs; however, granddaughter present today reports that there are multiple family members that can assist so that pt will have 24 hr care at home. Pt has a level entry to her home but is through the grass - pt has obtained a w/c to enter this door so that she does not have to do stairs.  In regards to TKA, pt with fair pain control and ROM.  Pt demonstrates safe gait & transfers in order to return home with family supervision from PT perspective once discharged by MD.  While in hospital, will continue to benefit from PT for skilled therapy to advance mobility and exercises.     Recommendations for follow up therapy are one component of a multi-disciplinary discharge planning process, led by the attending physician.  Recommendations may be updated based on patient status, additional functional criteria and insurance authorization.  Follow Up Recommendations  Follow physician's recommendations for discharge plan and follow up therapies     Assistance Recommended at Discharge Frequent or constant Supervision/Assistance  Patient can  return home with the following A little help with walking and/or transfers;A little help with bathing/dressing/bathroom;Assistance with cooking/housework;Help with stairs or ramp for entrance;Assist for transportation   Equipment Recommendations  None recommended by PT    Recommendations for Other Services       Precautions / Restrictions Precautions Precautions: Fall Precaution Comments: no pillow under the knee Restrictions Weight Bearing Restrictions: No LLE Weight Bearing: Weight bearing as tolerated     Mobility  Bed Mobility Overal bed mobility: Needs Assistance Bed Mobility: Supine to Sit     Supine to sit: Supervision          Transfers Overall transfer level: Needs assistance Equipment used: Rolling walker (2 wheels) Transfers: Sit to/from Stand Sit to Stand: Supervision           General transfer comment: Performed x 4 during session; increased time and close supervision but no assist    Ambulation/Gait Ambulation/Gait assistance: Supervision Gait Distance (Feet): 100 Feet Assistive device: Rolling walker (2 wheels) Gait Pattern/deviations: Step-through pattern, Decreased weight shift to left, Narrow base of support Gait velocity: decreased     General Gait Details: Pt progressed to step through pattern with only slight decrease in weight shift to L.  Pt with genu valgus bil and narrow BOS.  Pt continues to report dizziness but demonstrated steady gait pattern.  Did cue for focus points and segmental turns that helped somewhat with dizziness   Stairs Stairs:  (pt has w/c to get to level entry door)           Wheelchair Mobility  Modified Rankin (Stroke Patients Only)       Balance Overall balance assessment: Needs assistance Sitting-balance support: Feet supported, No upper extremity supported Sitting balance-Leahy Scale: Good     Standing balance support: Single extremity supported, Bilateral upper extremity supported, Reliant on  assistive device for balance Standing balance-Leahy Scale: Poor Standing balance comment: Requriing at least single UE support during ADLs; RW to ambulate                            Cognition Arousal/Alertness: Awake/alert Behavior During Therapy: WFL for tasks assessed/performed Overall Cognitive Status: Within Functional Limits for tasks assessed                                          Exercises Total Joint Exercises Ankle Circles/Pumps: AROM, Both, 10 reps Quad Sets: AROM, Left, 10 reps Heel Slides: AAROM, Left, 10 reps, Supine Hip ABduction/ADduction: AAROM, Left, 10 reps, Supine Long Arc Quad: AROM, Left, 10 reps, Seated (verbal, visual, and tacitle cues to prevent compensation techniques) Knee Flexion: AROM, Left, 10 reps, Seated (verbal, visual, and tacitle cues to prevent compensation techniques) Goniometric ROM: L knee 5 to 60 degrees    General Comments General comments (skin integrity, edema, etc.): Granddaughter present and reports that between multiple family members, pt will have 24 hr care     Vestibular Screen: Symptom Behavior Subjective history of current problem Pt reports "dizziness" started after surgery.  Pt denies any history of vertigo.  Pt states dizziness is mostly constant and only goes away when pt is laying or at rest.  Pt describes dizziness in terms of nausea and, "woozy," "pulling, hard to describe" but not spinning.  Frequency of Dizziness mostly constant  Duration of Dizziness started right after knee surgery  Symptom Nature Constant;Motion provoked - worse with any movement  Aggravating Factors Activity in general;Turning head quickly;Sitting with head tilted back;Forward bending;Supine to sit;Sit to stand;Rolling to right;Rolling to left  Relieving Factors Closing eyes;Rest;Slow movements  Progression of Symptoms Reports slight improvement  History of similar episodes None  Oculomotor  Exam Oculomotor Alignment: Normal Spontaneous Nystagmus: absent Gaze-induced Nystagmus: absent Smooth Pursuit: intact, but reports dizziness with all Gaze stabilization : intact   Positional Testing [employer]: + symptoms R and L but no nystagmus, symptoms similar to symptoms with all movement Horizontal roll: + symptoms R and L but no nystagmus, symptoms similar to symptoms with all movement      Pertinent Vitals/Pain Pain Assessment Pain Assessment: 0-10 Pain Score: 5  Pain Location: L knee Pain Descriptors / Indicators: Sore, Aching Pain Intervention(s): Limited activity within patient's tolerance, Monitored during session, Patient requesting pain meds-RN notified, Premedicated before session, Ice applied    Home Living                          Prior Function            PT Goals (current goals can now be found in the care plan section) Progress towards PT goals: Progressing toward goals    Frequency    7X/week      PT Plan Current plan remains appropriate    Co-evaluation              AM-PAC PT "6 Clicks" Mobility   Outcome Measure  Help needed turning  from your back to your side while in a flat bed without using bedrails?: A Little Help needed moving from lying on your back to sitting on the side of a flat bed without using bedrails?: A Little Help needed moving to and from a bed to a chair (including a wheelchair)?: A Little Help needed standing up from a chair using your arms (e.g., wheelchair or bedside chair)?: A Little Help needed to walk in hospital room?: A Little Help needed climbing 3-5 steps with a railing? : A Lot 6 Click Score: 17    End of Session Equipment Utilized During Treatment: Gait belt Activity Tolerance: Patient tolerated treatment well Patient left: with call bell/phone within reach;with chair alarm set;in chair;with family/visitor present Nurse Communication: Mobility status PT Visit Diagnosis: Difficulty in  walking, not elsewhere classified (R26.2);Pain Pain - Right/Left: Left Pain - part of body: Knee     Time: 1129-1208 PT Time Calculation (min) (ACUTE ONLY): 39 min  Charges:  $Gait Training: 8-22 mins $Therapeutic Exercise: 8-22 mins $Therapeutic Activity: 8-22 mins                     Abran Richard, PT Acute Rehab [facility] [place] 06/15/2022, 12:59 PM

## 2022-06-15 NOTE — Plan of Care (Signed)
  Problem: Pain Management: °Goal: Pain level will decrease with appropriate interventions °Outcome: Progressing °  °Problem: Safety: °Goal: Ability to remain free from injury will improve °Outcome: Progressing °  °

## 2022-06-16 LAB — GLUCOSE, CAPILLARY
Glucose-Capillary: 99 mg/dL (ref 70–99)
Glucose-Capillary: 107 mg/dL — ABNORMAL HIGH (ref 70–99)
Glucose-Capillary: 124 mg/dL — ABNORMAL HIGH (ref 70–99)
Glucose-Capillary: 92 mg/dL (ref 70–99)

## 2022-06-16 MED ORDER — HYDROCODONE-ACETAMINOPHEN 5-325 MG PO TABS: 1.0000 | ORAL_TABLET | ORAL | 0 refills | Status: DC | PRN

## 2022-06-16 MED ORDER — MECLIZINE HCL 25 MG PO TABS: 25.0000 mg | ORAL_TABLET | Freq: Three times a day (TID) | ORAL | 0 refills | Status: AC

## 2022-06-16 NOTE — Progress Notes (Signed)
    Subjective:  Patient reports pain as mild to moderate.  Denies N/V/CP/SOB currently. She states she had an episode of vomiting this morning when she took her pain medication. She reports she took it on an empty stomach. She states she had a bowel movement 2 days ago. She reports flatus. She denies any tingling or numbness in LE bilaterally. Discussed about discharge home today, patient states she is ready to go home.   Objective:   VITALS:   Vitals:   06/15/22 0522 06/15/22 1329 06/15/22 2218 06/16/22 0441  BP: (!) 138/59 128/61 133/62 (!) 119/59  Pulse: 90 93 92 87  Resp: 17 18 18 18   Temp: 98.8 F (37.1 C) 98.8 F (37.1 C) 98.4 F (36.9 C) 98.5 F (36.9 C)  TempSrc: Oral  Oral   SpO2: 95% 100% 98% 100%  Weight:      Height:        Patient lying in bed. NAD.  Neurologically intact ABD soft Neurovascular intact Sensation intact distally Intact pulses distally Dorsiflexion/Plantar flexion intact Incision: dressing C/D/I No cellulitis present Compartment soft   Lab Results  Component Value Date   WBC 9.9 06/15/2022   HGB 9.1 (L) 06/15/2022   HCT 29.2 (L) 06/15/2022   MCV 83.7 06/15/2022   PLT 387 06/15/2022   BMET    Component Value Date/Time   NA 138 06/15/2022 0329   K 4.8 06/15/2022 0329   CL 106 06/15/2022 0329   CO2 28 06/15/2022 0329   GLUCOSE 103 (H) 06/15/2022 0329   BUN 14 06/15/2022 0329   CREATININE 0.61 06/15/2022 0329   CALCIUM 8.9 06/15/2022 0329   GFRNONAA >60 06/15/2022 0329     Assessment/Plan: 8 Days Post-Op   Principal Problem:   Osteoarthritis of left knee Active Problems:   HTN (hypertension)   Sinus bradycardia   Normocytic anemia   WBAT with walker DVT ppx: Aspirin, SCDs, TEDS PO pain control PT/OT: Patient ambulated 100 feet with PT yesterday. She reports dizziness but no balance or gait issues with ambulation. PT cleared patient for D/c home.  Dispo:  [facility] consulting with patient, appreciate their help. Thought to  be multifactorial including polypharmacy, volume depletion. MRI of the brain was done to rule out vestibular basilar issues but was negative for acute findings.  Patient still with some degree of dizziness. PT with vestibular evaluation for BPPV was negative. Orthostatic vitals were obtained and patient does not have orthostatic hypotension.    Recommends d/c with a few days of meclizine for dizziness. Try to avoid polypharmacy on discharge. TRH states patient is medically stable for disposition.  - Patient has been cleared by [facility] and PT - Patient has not had a bowel movement in 2 days. Patient can d/c home once bowel movement achieved and absence of vomiting.      Charlott Rakes, PA-C 06/16/2022, 10:20 AM    [facility]  [facility] [address], [address], [place], [place] [address] Phone: [phone] [email or web address] Facebook  [employer]

## 2022-06-16 NOTE — Progress Notes (Signed)
Physical Therapy Treatment Patient Details Name: Kimberly Sellers MRN: [id number] DOB: [birth year] Today's Date: 06/16/2022   History of Present Illness Pt is a 77 yo female presenting s/p L-TKA on 06/08/22. PMH: DM, GERD, HTN    PT Comments    Pt is progressing well with therapy. Pt is pending d/c home today. Pt did not report any signs of dizziness with me during therapy. I reinforced knee ROM as she is limited currently extension -8 flexion 50 degrees. Pt has a HEP and is scheduled for HHPT services tomorrow. Pt is supervision with mobility using the RW on the unit. Pt completed stair training yesterday and we also reviewed the technique this morning. All education is complete and the patient is ready for d/c home with her family.    Recommendations for follow up therapy are one component of a multi-disciplinary discharge planning process, led by the attending physician.  Recommendations may be updated based on patient status, additional functional criteria and insurance authorization.  Follow Up Recommendations  Follow physician's recommendations for discharge plan and follow up therapies     Assistance Recommended at Discharge Intermittent Supervision/Assistance  Patient can return home with the following A little help with walking and/or transfers;A little help with bathing/dressing/bathroom;Assistance with cooking/housework;Help with stairs or ramp for entrance;Assist for transportation   Equipment Recommendations  None recommended by PT    Recommendations for Other Services       Precautions / Restrictions Precautions Precautions: Fall Restrictions Weight Bearing Restrictions: No LLE Weight Bearing: Weight bearing as tolerated     Mobility  Bed Mobility Overal bed mobility: Needs Assistance Bed Mobility: Supine to Sit     Supine to sit: Modified independent (Device/Increase time)          Transfers Overall transfer level: Needs assistance Equipment used: Rolling  walker (2 wheels) Transfers: Sit to/from Stand Sit to Stand: Supervision           General transfer comment: Min cues for L LE managment and hand placement but no physical assist    Ambulation/Gait Ambulation/Gait assistance: Supervision Gait Distance (Feet): 125 Feet Assistive device: Rolling walker (2 wheels) Gait Pattern/deviations: Step-through pattern, Decreased weight shift to left, Narrow base of support, Trunk flexed Gait velocity: decreased         Stairs         General stair comments: Verbally reviewed technique   Wheelchair Mobility    Modified Rankin (Stroke Patients Only)       Balance Overall balance assessment: Needs assistance Sitting-balance support: No upper extremity supported, Feet supported Sitting balance-Leahy Scale: Normal     Standing balance support: Single extremity supported, Bilateral upper extremity supported, Reliant on assistive device for balance Standing balance-Leahy Scale: Fair                              Cognition Arousal/Alertness: Awake/alert Behavior During Therapy: WFL for tasks assessed/performed Overall Cognitive Status: Within Functional Limits for tasks assessed                                          Exercises Total Joint Exercises Ankle Circles/Pumps: AROM, Strengthening, Both, 10 reps, Supine Quad Sets: AROM, Left, 10 reps, Supine Hip ABduction/ADduction: AROM, Strengthening, Left, 10 reps, Supine Straight Leg Raises: AROM, Strengthening, 10 reps, Supine Long Arc Quad: AROM, Strengthening, Left, 10 reps,  Seated Knee Flexion: AAROM, Strengthening, Left, 10 reps, Seated Goniometric ROM: Knee ROM 8-50    General Comments        Pertinent Vitals/Pain Pain Assessment Pain Assessment: 0-10 Pain Score: 7  Pain Location: L knee Pain Descriptors / Indicators: Discomfort Pain Intervention(s): Limited activity within patient's tolerance, RN gave pain meds during session     Home Living                          Prior Function            PT Goals (current goals can now be found in the care plan section) Progress towards PT goals: Goals met/education completed, patient discharged from PT    Frequency    7X/week      PT Plan Current plan remains appropriate    Co-evaluation              AM-PAC PT "6 Clicks" Mobility   Outcome Measure  Help needed turning from your back to your side while in a flat bed without using bedrails?: A Little Help needed moving from lying on your back to sitting on the side of a flat bed without using bedrails?: A Little Help needed moving to and from a bed to a chair (including a wheelchair)?: A Little Help needed standing up from a chair using your arms (e.g., wheelchair or bedside chair)?: A Little Help needed to walk in hospital room?: A Little Help needed climbing 3-5 steps with a railing? : A Little 6 Click Score: 18    End of Session Equipment Utilized During Treatment: Gait belt Activity Tolerance: Patient tolerated treatment well Patient left: with call bell/phone within reach;with chair alarm set;in chair Nurse Communication: Mobility status PT Visit Diagnosis: Difficulty in walking, not elsewhere classified (R26.2);Pain Pain - Right/Left: Left Pain - part of body: Knee     Time: 0037-0488 PT Time Calculation (min) (ACUTE ONLY): 27 min  Charges:  $Gait Training: 8-22 mins $Therapeutic Exercise: 8-22 mins                       Lelon Mast 06/16/2022, 9:03 AM

## 2022-06-17 LAB — GLUCOSE, CAPILLARY
Glucose-Capillary: 80 mg/dL (ref 70–99)
Glucose-Capillary: 107 mg/dL — ABNORMAL HIGH (ref 70–99)

## 2022-06-17 NOTE — Progress Notes (Signed)
    Subjective:  Patient reports pain as mild to moderate.  Denies N/V/CP/SOB this morning. She reports she has still not had a bowel movement since Tuesday, she does report flatus. She denies any tingling or numbness in LE bilaterally. She reports her dizziness has continued to improve but is still present. Patient is ready to go home. We discussed once she has a bowel movement she can go home. We discussed to continue to increase fluid intake.    Objective:   VITALS:   Vitals:   06/16/22 0441 06/16/22 1323 06/16/22 2129 06/17/22 0456  BP: (!) 119/59 116/64 (!) 151/80 138/64  Pulse: 87 93 88 88  Resp: 18 16 18 16   Temp: 98.5 F (36.9 C) 98.3 F (36.8 C) 98.7 F (37.1 C) 98.7 F (37.1 C)  TempSrc:  Oral Oral Oral  SpO2: 100% 99% 98% 98%  Weight:      Height:        Patient lying in bed. NAD. Daughter at bedside.  Neurologically intact Neurovascular intact Sensation intact distally Intact pulses distally Dorsiflexion/Plantar flexion intact Incision: dressing C/D/I No cellulitis present Compartment soft   Lab Results  Component Value Date   WBC 9.9 06/15/2022   HGB 9.1 (L) 06/15/2022   HCT 29.2 (L) 06/15/2022   MCV 83.7 06/15/2022   PLT 387 06/15/2022   BMET    Component Value Date/Time   NA 138 06/15/2022 0329   K 4.8 06/15/2022 0329   CL 106 06/15/2022 0329   CO2 28 06/15/2022 0329   GLUCOSE 103 (H) 06/15/2022 0329   BUN 14 06/15/2022 0329   CREATININE 0.61 06/15/2022 0329   CALCIUM 8.9 06/15/2022 0329   GFRNONAA >60 06/15/2022 0329     Assessment/Plan: 9 Days Post-Op   Principal Problem:   Osteoarthritis of left knee Active Problems:   HTN (hypertension)   Sinus bradycardia   Normocytic anemia   WBAT with walker DVT ppx: Aspirin, SCDs, TEDS PO pain control PT/OT: Patient cleared with PT, improved dizziness with PT yesterday.  Dispo:  - [facility] work up was negative for patient dizziness.    Recommends d/c with a few days of meclizine for  dizziness. Try to avoid polypharmacy on discharge. TRH states patient is medically stable for disposition.  - Patient has been cleared by [facility] and PT - Patient can d/c home with OPPT once she has a bowel movement.    Charlott Rakes, PA-C 06/17/2022, 9:51 AM    [facility]  [facility] [address], [address], [place], [place] [address] Phone: [phone] [email or web address] Facebook  [employer]

## 2022-06-17 NOTE — Progress Notes (Signed)
Pt alert and oriented. Surgical dressing clean, dry and intact. No questions or queries regarding discharge instructions. Belongings sent home with pt.  

## 2022-06-17 NOTE — Progress Notes (Signed)
Physical Therapy Treatment Patient Details Name: Kimberly Sellers MRN: [id number] DOB: 21-Jan-1946 Today's Date: 06/17/2022   History of Present Illness Pt is a 77 yo female presenting s/p L-TKA on 06/08/22. PMH: DM, GERD, HTN    PT Comments    Pt with good progress with mobility. She still has c/o dizziness but has steady/safe gait and transfers (suspect medication related as other causes have been ruled out).  She ambulated 300' and demonstrates transfers with supervision. Pt does have limited knee flexion with painful tight end feel.  Began manual stretching with flexion as pt is POD #9.  She remains stable from mobility perspective for d/c once cleared by MD.  Will drop to qd treatments as pt is POD #9 , cleared from therapy for d/c, and just awaiting to have BM for d/c.    Recommendations for follow up therapy are one component of a multi-disciplinary discharge planning process, led by the attending physician.  Recommendations may be updated based on patient status, additional functional criteria and insurance authorization.  Follow Up Recommendations  Follow physician's recommendations for discharge plan and follow up therapies     Assistance Recommended at Discharge Intermittent Supervision/Assistance  Patient can return home with the following A little help with walking and/or transfers;A little help with bathing/dressing/bathroom;Assistance with cooking/housework;Help with stairs or ramp for entrance;Assist for transportation   Equipment Recommendations  None recommended by PT    Recommendations for Other Services       Precautions / Restrictions Precautions Precautions: Fall Restrictions Weight Bearing Restrictions: No LLE Weight Bearing: Weight bearing as tolerated     Mobility  Bed Mobility Overal bed mobility: Needs Assistance Bed Mobility: Supine to Sit     Supine to sit: Modified independent (Device/Increase time)          Transfers Overall transfer level:  Needs assistance Equipment used: Rolling walker (2 wheels) Transfers: Sit to/from Stand Sit to Stand: Supervision, From elevated surface           General transfer comment: Min cues for L LE managment and hand placement but no physical assist; bed elevated - reports has lift chair at home    Ambulation/Gait Ambulation/Gait assistance: Supervision Gait Distance (Feet): 300 Feet Assistive device: Rolling walker (2 wheels) Gait Pattern/deviations: Step-through pattern, Decreased weight shift to left, Narrow base of support, Trunk flexed Gait velocity: decreased     General Gait Details: Pt progressed to step through pattern with only slight decrease in weight shift to L.  Pt with genu valgus bil and narrow BOS.  Pt continues to report dizziness but demonstrated steady gait pattern. Limited ROM L knee with gait   Stairs             Wheelchair Mobility    Modified Rankin (Stroke Patients Only)       Balance Overall balance assessment: Needs assistance Sitting-balance support: No upper extremity supported, Feet supported Sitting balance-Leahy Scale: Normal     Standing balance support: Single extremity supported, Bilateral upper extremity supported, Reliant on assistive device for balance Standing balance-Leahy Scale: Poor Standing balance comment: Requriing at least single UE support during ADLs; RW to ambulate                            Cognition Arousal/Alertness: Awake/alert Behavior During Therapy: WFL for tasks assessed/performed Overall Cognitive Status: Within Functional Limits for tasks assessed  Exercises Total Joint Exercises Ankle Circles/Pumps: AROM, Strengthening, Both, Seated, 20 reps Quad Sets: AROM, Left, 10 reps, Supine Heel Slides: AAROM, Left, 10 reps, Supine Hip ABduction/ADduction: AROM, Strengthening, Left, 10 reps, Supine Long Arc Quad: AROM, Strengthening, Left, Seated,  20 reps (multimodal cues for technique) Knee Flexion: AAROM, Strengthening, Left, Seated, 20 reps (10x2; 5 sec hold; began manual stretch at end range as POD#9 now) Goniometric ROM: Knee ROM 8 to 50    General Comments General comments (skin integrity, edema, etc.): Daughter present      Pertinent Vitals/Pain Pain Assessment Pain Assessment: 0-10 Pain Score: 7  Pain Location: L knee Pain Descriptors / Indicators: Discomfort Pain Intervention(s): Limited activity within patient's tolerance, Monitored during session    Home Living                          Prior Function            PT Goals (current goals can now be found in the care plan section) Progress towards PT goals: Progressing toward goals    Frequency    7X/week      PT Plan Current plan remains appropriate    Co-evaluation              AM-PAC PT "6 Clicks" Mobility   Outcome Measure  Help needed turning from your back to your side while in a flat bed without using bedrails?: A Little Help needed moving from lying on your back to sitting on the side of a flat bed without using bedrails?: A Little Help needed moving to and from a bed to a chair (including a wheelchair)?: A Little Help needed standing up from a chair using your arms (e.g., wheelchair or bedside chair)?: A Little Help needed to walk in hospital room?: A Little Help needed climbing 3-5 steps with a railing? : A Little 6 Click Score: 18    End of Session Equipment Utilized During Treatment: Gait belt Activity Tolerance: Patient tolerated treatment well Patient left: with call bell/phone within reach;with chair alarm set;in chair Nurse Communication: Mobility status PT Visit Diagnosis: Difficulty in walking, not elsewhere classified (R26.2);Pain Pain - Right/Left: Left Pain - part of body: Knee     Time: 1660-6301 PT Time Calculation (min) (ACUTE ONLY): 24 min  Charges:  $Gait Training: 8-22 mins $Therapeutic Exercise:  8-22 mins                     Abran Richard, PT Acute Rehab [facility] [phone]    Karlton Lemon 06/17/2022, 9:52 AM

## 2022-06-17 NOTE — Care Management Important Message (Signed)
Important Message  Patient Details IM Letter given. Name: Kimberly Sellers MRN: [id number] Date of Birth: 1945-12-31   Medicare Important Message Given:  Yes     Kerin Salen 06/17/2022, 10:08 AM

## 2022-06-24 NOTE — Discharge Summary (Signed)
Physician Discharge Summary  Patient ID: JAIDENCE WINGROVE MRN: [id number] DOB/AGE: 1945-12-01 77 y.o.  Admit date: 06/08/2022 Discharge date: 06/17/2022  Admission Diagnoses:  Osteoarthritis of left knee  Discharge Diagnoses:  Principal Problem:   Osteoarthritis of left knee Active Problems:   HTN (hypertension)   Sinus bradycardia   Normocytic anemia   Past Medical History:  Diagnosis Date   Anxiety    Arthritis    Diabetes mellitus without complication ([place])    Dyspnea    with exertion   GERD (gastroesophageal reflux disease)    Hypertension    Nausea & vomiting 06/01/2012    Surgeries: Procedure(s): COMPUTER ASSISTED TOTAL KNEE ARTHROPLASTY on 06/08/2022   Consultants (if any):   Discharged Condition: Improved  Hospital Course: NANI DUNCAN is an 77 y.o. female who was admitted 06/08/2022 with a diagnosis of Osteoarthritis of left knee and went to the operating room on 06/08/2022 and underwent the above named procedures.    She was given perioperative antibiotics:  Anti-infectives (From admission, onward)    Start     Dose/Rate Route Frequency Ordered Stop   06/08/22 1245  ceFAZolin (ANCEF) IVPB 2g/100 mL premix        2 g 200 mL/hr over 30 Minutes Intravenous Every 6 hours 06/08/22 1234 06/09/22 0044   06/08/22 0715  ceFAZolin (ANCEF) IVPB 2g/100 mL premix        2 g 200 mL/hr over 30 Minutes Intravenous On call to O.R. 06/08/22 0701 06/08/22 1008       She was given sequential compression devices, early ambulation, and aspirin for DVT prophylaxis.   POD#1 TRH was consulted consulted for evaluation of bradycardia nausea and lightheadedness. She underwent a thorough work-up.   POD#2-#8: She continued to have dizziness. She continued to ambulate with PT throughout her hospital stay. Her medications were adjusted during hospital stay.    Dizziness, vertigo, nausea.  Thought to be multifactorial including polypharmacy, volume depletion.  MRI of the brain was  done to rule out vestibular basilar issues but was negative for acute findings.  Patient still with some degree of dizziness..  PT with vestibular evaluation for BPPV was negative.    Orthostatic vitals were obtained and patient does not have orthostatic hypotension.  At this time patient has nonspecific dizziness.  Please try to avoid polypharmacy with continued outpatient physical therapy on discharge.  Would continue meclizine for a few more days on discharge.  Sinus bradycardia Question vasovagal.  Telemetry without any significant arrhythmia.  2D echocardiogram with LV ejection fraction of 65 to 70% with grade 1 diastolic dysfunction..  Cardiology was consulted without any specific recommendations.   Heart rate has improved at this time.  Avoid nodal blockers.   HTN (hypertension) Patient is on amlodipine, lisinopril and HCTZ.  Latest blood pressure of 128/61   Normocytic anemia Latest hemoglobin 9. 1.   Mild hypokalemia.  Potassium of 4.1 and has resolved. POD#9: Her dizziness had slightly improbed. TRH recommended d/c with a few days of meclizine for dizziness. Try to avoid polypharmacy on discharge. TRH states patient is medically stable for disposition. Patient has been cleared by [facility] and PT. Patient was able to have a bowel movement and d/c home with OPPT.   She benefited maximally from the hospital stay and there were no complications.    Recent vital signs:  Vitals:   06/16/22 2129 06/17/22 0456  BP: (!) 151/80 138/64  Pulse: 88 88  Resp: 18 16  Temp: 98.7 F (37.1  C) 98.7 F (37.1 C)  SpO2: 98% 98%    Recent laboratory studies:  Lab Results  Component Value Date   HGB 9.1 (L) 06/15/2022   HGB 9.1 (L) 06/13/2022   HGB 9.7 (L) 06/10/2022   Lab Results  Component Value Date   WBC 9.9 06/15/2022   PLT 387 06/15/2022   Lab Results  Component Value Date   INR 1.43 06/02/2012   Lab Results  Component Value Date   NA 138 06/15/2022   K 4.8 06/15/2022   CL 106  06/15/2022   CO2 28 06/15/2022   BUN 14 06/15/2022   CREATININE 0.61 06/15/2022   GLUCOSE 103 (H) 06/15/2022     Allergies as of 06/17/2022   No Known Allergies      Medication List     STOP taking these medications    aspirin EC 81 MG tablet Replaced by: aspirin 81 MG chewable tablet       TAKE these medications    acetaminophen 500 MG tablet Commonly known as: TYLENOL Take 2 tablets (1,000 mg total) by mouth every 8 (eight) hours as needed for moderate pain. What changed: when to take this   amLODipine 10 MG tablet Commonly known as: NORVASC Take 10 mg by mouth daily.   aspirin 81 MG chewable tablet Commonly known as: Aspirin Childrens Chew 1 tablet (81 mg total) by mouth 2 (two) times daily with a meal. Replaces: aspirin EC 81 MG tablet   cycloSPORINE 0.05 % ophthalmic emulsion Commonly known as: RESTASIS Place 1 drop into both eyes 2 (two) times daily as needed (dry eyes).   docusate sodium 100 MG capsule Commonly known as: Colace Take 1 capsule (100 mg total) by mouth 2 (two) times daily.   HYDROcodone-acetaminophen 5-325 MG tablet Commonly known as: NORCO/VICODIN Take 1 tablet by mouth every 4 (four) hours as needed for moderate pain or severe pain.   ICY HOT EX Apply 1 Application topically daily as needed (pain).   lisinopril-hydrochlorothiazide 20-25 MG tablet Commonly known as: ZESTORETIC Take 1 tablet by mouth daily.   loratadine 10 MG tablet Commonly known as: CLARITIN Take 10 mg by mouth daily as needed for allergies.   meloxicam 7.5 MG tablet Commonly known as: MOBIC Take 1 tablet (7.5 mg total) by mouth daily as needed for pain.   metFORMIN 500 MG tablet Commonly known as: GLUCOPHAGE Take 500 mg by mouth daily.   MULTIVITAMIN PO Take 1 tablet by mouth daily.   ondansetron 4 MG tablet Commonly known as: Zofran Take 1 tablet (4 mg total) by mouth every 8 (eight) hours as needed for nausea or vomiting.   pantoprazole 40 MG  tablet Commonly known as: PROTONIX Take 40 mg by mouth daily.   polyethylene glycol 17 g packet Commonly known as: MiraLax Take 17 g by mouth daily as needed for moderate constipation or severe constipation.   senna 8.6 MG Tabs tablet Commonly known as: SENOKOT Take 2 tablets (17.2 mg total) by mouth at bedtime.   Vitamin D3 50 MCG (2000 UT) capsule Take 2,000 Units by mouth daily.       ASK your doctor about these medications    meclizine 25 MG tablet Commonly known as: ANTIVERT Take 1 tablet (25 mg total) by mouth 3 (three) times daily for 3 days. Ask about: Should I take this medication?               Discharge Care Instructions  (From admission, onward)  Start     Ordered   06/17/22 0000  Weight bearing as tolerated        06/17/22 1146   06/17/22 0000  Change dressing       Comments: Do not remove your dressing.   06/17/22 1146              WEIGHT BEARING   Weight bearing as tolerated with assist device (walker, cane, etc) as directed, use it as long as suggested by your surgeon or therapist, typically at least 4-6 weeks.   EXERCISES  Results after joint replacement surgery are often greatly improved when you follow the exercise, range of motion and muscle strengthening exercises prescribed by your doctor. Safety measures are also important to protect the joint from further injury. Any time any of these exercises cause you to have increased pain or swelling, decrease what you are doing until you are comfortable again and then slowly increase them. If you have problems or questions, call your caregiver or physical therapist for advice.   Rehabilitation is important following a joint replacement. After just a few days of immobilization, the muscles of the leg can become weakened and shrink (atrophy).  These exercises are designed to build up the tone and strength of the thigh and leg muscles and to improve motion. Often times heat used for  twenty to thirty minutes before working out will loosen up your tissues and help with improving the range of motion but do not use heat for the first two weeks following surgery (sometimes heat can increase post-operative swelling).   These exercises can be done on a training (exercise) mat, on the floor, on a table or on a bed. Use whatever works the best and is most comfortable for you.    Use music or television while you are exercising so that the exercises are a pleasant break in your day. This will make your life better with the exercises acting as a break in your routine that you can look forward to.   Perform all exercises about fifteen times, three times per day or as directed.  You should exercise both the operative leg and the other leg as well.  Exercises include:   Quad Sets - Tighten up the muscle on the front of the thigh (Quad) and hold for 5-10 seconds.   Straight Leg Raises - With your knee straight (if you were given a brace, keep it on), lift the leg to 60 degrees, hold for 3 seconds, and slowly lower the leg.  Perform this exercise against resistance later as your leg gets stronger.  Leg Slides: Lying on your back, slowly slide your foot toward your buttocks, bending your knee up off the floor (only go as far as is comfortable). Then slowly slide your foot back down until your leg is flat on the floor again.  Angel Wings: Lying on your back spread your legs to the side as far apart as you can without causing discomfort.  Hamstring Strength:  Lying on your back, push your heel against the floor with your leg straight by tightening up the muscles of your buttocks.  Repeat, but this time bend your knee to a comfortable angle, and push your heel against the floor.  You may put a pillow under the heel to make it more comfortable if necessary.   A rehabilitation program following joint replacement surgery can speed recovery and prevent re-injury in the future due to weakened muscles.  Contact your doctor or a  physical therapist for more information on knee rehabilitation.    CONSTIPATION  Constipation is defined medically as fewer than three stools per week and severe constipation as less than one stool per week.  Even if you have a regular bowel pattern at home, your normal regimen is likely to be disrupted due to multiple reasons following surgery.  Combination of anesthesia, postoperative narcotics, change in appetite and fluid intake all can affect your bowels.   YOU MUST use at least one of the following options; they are listed in order of increasing strength to get the job done.  They are all available over the counter, and you may need to use some, POSSIBLY even all of these options:    Drink plenty of fluids (prune juice may be helpful) and high fiber foods Colace 100 mg by mouth twice a day  Senokot for constipation as directed and as needed Dulcolax (bisacodyl), take with full glass of water  Miralax (polyethylene glycol) once or twice a day as needed.  If you have tried all these things and are unable to have a bowel movement in the first 3-4 days after surgery call either your surgeon or your primary doctor.    If you experience loose stools or diarrhea, hold the medications until you stool forms back up.  If your symptoms do not get better within 1 week or if they get worse, check with your doctor.  If you experience "the worst abdominal pain ever" or develop nausea or vomiting, please contact the office immediately for further recommendations for treatment.   ITCHING:  If you experience itching with your medications, try taking only a single pain pill, or even half a pain pill at a time.  You can also use Benadryl over the counter for itching or also to help with sleep.   TED HOSE STOCKINGS:  Use stockings on both legs until for at least 2 weeks or as directed by physician office. They may be removed at night for sleeping.  MEDICATIONS:  See your medication  summary on the "After Visit Summary" that nursing will review with you.  You may have some home medications which will be placed on hold until you complete the course of blood thinner medication.  It is important for you to complete the blood thinner medication as prescribed.  PRECAUTIONS:  If you experience chest pain or shortness of breath - call 911 immediately for transfer to the hospital emergency department.   If you develop a fever greater that 101 F, purulent drainage from wound, increased redness or drainage from wound, foul odor from the wound/dressing, or calf pain - CONTACT YOUR SURGEON.                                                   FOLLOW-UP APPOINTMENTS:  If you do not already have a post-op appointment, please call the office for an appointment to be seen by your surgeon.  Guidelines for how soon to be seen are listed in your "After Visit Summary", but are typically between 1-4 weeks after surgery.  OTHER INSTRUCTIONS:   Knee Replacement:  Do not place pillow under knee, focus on keeping the knee straight while resting. CPM instructions: 0-90 degrees, 2 hours in the morning, 2 hours in the afternoon, and 2 hours in the evening. Place foam block, curve side up  under heel at all times except when in CPM or when walking.  DO NOT modify, tear, cut, or change the foam block in any way.   MAKE SURE YOU:  Understand these instructions.  Get help right away if you are not doing well or get worse.    Thank you for letting us be a part of your medical care team.  It is a privilege we respect greatly.  We hope these instructions will help you stay on track for a fast and full recovery!   Diagnostic Studies: MR BRAIN WO CONTRAST  Result Date: 06/14/2022 CLINICAL DATA:  Vertigo EXAM: MRI HEAD WITHOUT CONTRAST TECHNIQUE: Multiplanar, multiecho pulse sequences of the brain and surrounding structures were obtained without intravenous contrast. COMPARISON:  None Available. FINDINGS: Brain: No  acute infarct, mass effect or extra-axial collection. No chronic microhemorrhage or siderosis. Normal white matter signal, parenchymal volume and CSF spaces. The midline structures are normal. Vascular: Normal flow voids. Skull and upper cervical spine: Normal marrow signal. Sinuses/Orbits: Negative. Other: None. IMPRESSION: Normal MRI of the brain. Electronically Signed   By: Ulyses Jarred M.D.   On: 06/14/2022 18:45   ECHOCARDIOGRAM COMPLETE  Result Date: 06/11/2022    ECHOCARDIOGRAM REPORT   Patient Name:   SORANGEL LOPRESTI Date of Exam: 06/11/2022 Medical Rec #:  [id number]         Height:       67.0 in Accession #:    [id number]        Weight:       180.0 lb Date of Birth:  [birth year]          BSA:          1.934 m Patient Age:    24 years          BP:           118/56 mmHg Patient Gender: F                 HR:           78 bpm. Exam Location:  Inpatient Procedure: 2D Echo, Cardiac Doppler and Color Doppler Indications:    R94.31 Abnormal EKG  History:        Patient has prior history of Echocardiogram examinations, most                 recent 10/13/2020. Signs/Symptoms:Dyspnea; Risk Factors:Diabetes,                 Hypertension and GERD.  Sonographer:    Bernadene Person RDCS Referring Phys: [id number] [place]  1. Left ventricular ejection fraction, by estimation, is 65 to 70%. The left ventricle has normal function. The left ventricle has no regional wall motion abnormalities. Left ventricular diastolic parameters are consistent with Grade I diastolic dysfunction (impaired relaxation).  2. Right ventricular systolic function is normal. The right ventricular size is normal. Tricuspid regurgitation signal is inadequate for assessing PA pressure.  3. Left atrial size was mildly dilated.  4. The mitral valve is normal in structure. Trivial mitral valve regurgitation. No evidence of mitral stenosis.  5. The aortic valve is tricuspid. Aortic valve regurgitation is trivial. Mild aortic valve  stenosis.  6. The inferior vena cava is normal in size with greater than 50% respiratory variability, suggesting right atrial pressure of 3 mmHg. FINDINGS  Left Ventricle: Left ventricular ejection fraction, by estimation, is 65 to 70%. The left ventricle has normal function. The left ventricle has no regional  wall motion abnormalities. The left ventricular internal cavity size was normal in size. There is  no left ventricular hypertrophy. Left ventricular diastolic parameters are consistent with Grade I diastolic dysfunction (impaired relaxation). Right Ventricle: The right ventricular size is normal. Right ventricular systolic function is normal. Tricuspid regurgitation signal is inadequate for assessing PA pressure. The tricuspid regurgitant velocity is 2.47 m/s, and with an assumed right atrial  pressure of 3 mmHg, the estimated right ventricular systolic pressure is [id number]. Left Atrium: Left atrial size was mildly dilated. Right Atrium: Right atrial size was normal in size. Pericardium: There is no evidence of pericardial effusion. Mitral Valve: The mitral valve is normal in structure. Trivial mitral valve regurgitation. No evidence of mitral valve stenosis. Tricuspid Valve: The tricuspid valve is normal in structure. Tricuspid valve regurgitation is trivial. No evidence of tricuspid stenosis. Aortic Valve: The aortic valve is tricuspid. Aortic valve regurgitation is trivial. Aortic regurgitation PHT measures 393 msec. Mild aortic stenosis is present. Aortic valve mean gradient measures 9.0 mmHg. Aortic valve peak gradient measures 17.0 mmHg. Aortic valve area, by VTI measures 1.80 cm. Pulmonic Valve: The pulmonic valve was normal in structure. Pulmonic valve regurgitation is not visualized. No evidence of pulmonic stenosis. Aorta: The aortic root is normal in size and structure. Venous: The inferior vena cava is normal in size with greater than 50% respiratory variability, suggesting right atrial pressure  of 3 mmHg. IAS/Shunts: No atrial level shunt detected by color flow Doppler.  LEFT VENTRICLE PLAX 2D LVIDd:         4.40 cm     Diastology LVIDs:         2.70 cm     LV e' medial:    7.72 cm/s LV PW:         0.90 cm     LV E/e' medial:  10.4 LV IVS:        0.90 cm     LV e' lateral:   11.20 cm/s LVOT diam:     2.00 cm     LV E/e' lateral: 7.1 LV SV:         77 LV SV Index:   40 LVOT Area:     3.14 cm  LV Volumes (MOD) LV vol d, MOD A2C: 89.8 ml LV vol d, MOD A4C: 94.2 ml LV vol s, MOD A2C: 31.1 ml LV vol s, MOD A4C: 30.9 ml LV SV MOD A2C:     58.7 ml LV SV MOD A4C:     94.2 ml LV SV MOD BP:      63.2 ml RIGHT VENTRICLE RV S prime:     13.70 cm/s TAPSE (M-mode): 2.0 cm LEFT ATRIUM             Index        RIGHT ATRIUM           Index LA diam:        3.90 cm 2.02 cm/m   RA Area:     23.30 cm LA Vol (A2C):   72.7 ml 37.60 ml/m  RA Volume:   76.80 ml  39.72 ml/m LA Vol (A4C):   68.7 ml 35.53 ml/m LA Biplane Vol: 70.9 ml 36.67 ml/m  AORTIC VALVE AV Area (Vmax):    1.81 cm AV Area (Vmean):   1.84 cm AV Area (VTI):     1.80 cm AV Vmax:           206.00 cm/s AV Vmean:  142.000 cm/s AV VTI:            0.428 m AV Peak Grad:      17.0 mmHg AV Mean Grad:      9.0 mmHg LVOT Vmax:         119.00 cm/s LVOT Vmean:        83.300 cm/s LVOT VTI:          0.245 m LVOT/AV VTI ratio: 0.57 AI PHT:            393 msec  AORTA Ao Root diam: 2.90 cm Ao Asc diam:  3.70 cm MITRAL VALVE               TRICUSPID VALVE MV Area (PHT): 2.60 cm    TR Peak grad:   24.4 mmHg MV Decel Time: 292 msec    TR Vmax:        247.00 cm/s MV E velocity: 80.00 cm/s MV A velocity: 91.20 cm/s  SHUNTS MV E/A ratio:  0.88        Systemic VTI:  0.24 m                            Systemic Diam: 2.00 cm Kirk Ruths MD Electronically signed by Kirk Ruths MD Signature Date/Time: 06/11/2022/10:20:54 AM    Final    DG Knee Left Port  Result Date: 06/08/2022 CLINICAL DATA:  Status post total left knee arthroplasty. Postoperative. EXAM: PORTABLE  LEFT KNEE - 1-2 VIEW COMPARISON:  Left knee radiographs 04/06/2020 FINDINGS: Status post total left knee arthroplasty. No perihardware lucency is seen to indicate hardware failure or loosening. Redemonstration of curvilinear ossific density overlying the proximal medial collateral ligament, just medial to the superior medial femoral condyle (chronic Pellegrini-Stieda lesion). Postoperative subcutaneous and intra-articular air are seen. No significant joint fluid. Anterior surgical skin staples. No acute fracture or dislocation. Mild vascular calcifications. IMPRESSION: Status post total left knee arthroplasty without evidence of hardware failure. Electronically Signed   By: Yvonne Kendall M.D.   On: 06/08/2022 12:18    Disposition: Discharge disposition: 01-Home or Self Care       Discharge Instructions     Call MD / Call 911   Complete by: As directed    If you experience chest pain or shortness of breath, CALL 911 and be transported to the hospital emergency room.  If you develope a fever above 101 F, pus (white drainage) or increased drainage or redness at the wound, or calf pain, call your surgeon's office.   Call MD / Call 911   Complete by: As directed    If you experience chest pain or shortness of breath, CALL 911 and be transported to the hospital emergency room.  If you develope a fever above 101 F, pus (white drainage) or increased drainage or redness at the wound, or calf pain, call your surgeon's office.   Change dressing   Complete by: As directed    Do not remove your dressing.   Constipation Prevention   Complete by: As directed    Drink plenty of fluids.  Prune juice may be helpful.  You may use a stool softener, such as Colace (over the counter) 100 mg twice a day.  Use MiraLax (over the counter) for constipation as needed.   Constipation Prevention   Complete by: As directed    Drink plenty of fluids.  Prune juice may be helpful.  You may use a stool  softener, such as  Colace (over the counter) 100 mg twice a day.  Use MiraLax (over the counter) for constipation as needed.   Diet - low sodium heart healthy   Complete by: As directed    Diet Carb Modified   Complete by: As directed    Discharge instructions   Complete by: As directed    Elevate toes above nose. Use cryotherapy as needed for pain and swelling.   Do not put a pillow under the knee. Place it under the heel.   Complete by: As directed    Do not put a pillow under the knee. Place it under the heel.   Complete by: As directed    Driving restrictions   Complete by: As directed    No driving for 4 weeks   Driving restrictions   Complete by: As directed    No driving for 6 weeks   Increase activity slowly as tolerated   Complete by: As directed    Increase activity slowly as tolerated   Complete by: As directed    Lifting restrictions   Complete by: As directed    No lifting for 6 weeks   Lifting restrictions   Complete by: As directed    No lifting for 6 weeks   Post-operative opioid taper instructions:   Complete by: As directed    POST-OPERATIVE OPIOID TAPER INSTRUCTIONS: It is important to wean off of your opioid medication as soon as possible. If you do not need pain medication after your surgery it is ok to stop day one. Opioids include: Codeine, Hydrocodone(Norco, Vicodin), Oxycodone(Percocet, oxycontin) and hydromorphone amongst others.  Long term and even short term use of opiods can cause: Increased pain response Dependence Constipation Depression Respiratory depression And more.  Withdrawal symptoms can include Flu like symptoms Nausea, vomiting And more Techniques to manage these symptoms Hydrate well Eat regular healthy meals Stay active Use relaxation techniques(deep breathing, meditating, yoga) Do Not substitute Alcohol to help with tapering If you have been on opioids for less than two weeks and do not have pain than it is ok to stop all together.  Plan to  wean off of opioids This plan should start within one week post op of your joint replacement. Maintain the same interval or time between taking each dose and first decrease the dose.  Cut the total daily intake of opioids by one tablet each day Next start to increase the time between doses. The last dose that should be eliminated is the evening dose.      Post-operative opioid taper instructions:   Complete by: As directed    POST-OPERATIVE OPIOID TAPER INSTRUCTIONS: It is important to wean off of your opioid medication as soon as possible. If you do not need pain medication after your surgery it is ok to stop day one. Opioids include: Codeine, Hydrocodone(Norco, Vicodin), Oxycodone(Percocet, oxycontin) and hydromorphone amongst others.  Long term and even short term use of opiods can cause: Increased pain response Dependence Constipation Depression Respiratory depression And more.  Withdrawal symptoms can include Flu like symptoms Nausea, vomiting And more Techniques to manage these symptoms Hydrate well Eat regular healthy meals Stay active Use relaxation techniques(deep breathing, meditating, yoga) Do Not substitute Alcohol to help with tapering If you have been on opioids for less than two weeks and do not have pain than it is ok to stop all together.  Plan to wean off of opioids This plan should start within one week post op of your  joint replacement. Maintain the same interval or time between taking each dose and first decrease the dose.  Cut the total daily intake of opioids by one tablet each day Next start to increase the time between doses. The last dose that should be eliminated is the evening dose.      TED hose   Complete by: As directed    Use stockings (TED hose) for 2 weeks on both leg(s).  You may remove them at night for sleeping.   TED hose   Complete by: As directed    Use stockings (TED hose) for 2 weeks on both leg(s).  You may remove them at night  for sleeping.   Weight bearing as tolerated   Complete by: As directed         Follow-up Information     Charlott Rakes, PA-C. Schedule an appointment as soon as possible for a visit in 2 week(s).   Specialty: Orthopedic Surgery Why: For wound re-check Contact information: [address], [address] [place] [address] [id number]                  Signed: Charlott Rakes 06/24/2022, 11:21 AM

## 2022-09-06 ENCOUNTER — Ambulatory Visit (INDEPENDENT_AMBULATORY_CARE_PROVIDER_SITE_OTHER): Payer: 59 | Admitting: Podiatry

## 2022-09-06 DIAGNOSIS — Z91199 Patient's noncompliance with other medical treatment and regimen due to unspecified reason: Secondary | ICD-10-CM

## 2022-09-06 NOTE — Progress Notes (Signed)
1. No-show for appointment     

## 2022-09-28 ENCOUNTER — Ambulatory Visit: Payer: 59 | Admitting: Podiatry

## 2022-10-05 ENCOUNTER — Encounter: Payer: Self-pay | Admitting: Podiatry

## 2022-10-05 ENCOUNTER — Ambulatory Visit (INDEPENDENT_AMBULATORY_CARE_PROVIDER_SITE_OTHER): Payer: 59 | Admitting: Podiatry

## 2022-10-05 DIAGNOSIS — M79675 Pain in left toe(s): Secondary | ICD-10-CM | POA: Diagnosis not present

## 2022-10-05 DIAGNOSIS — B351 Tinea unguium: Secondary | ICD-10-CM | POA: Diagnosis not present

## 2022-10-05 DIAGNOSIS — L84 Corns and callosities: Secondary | ICD-10-CM | POA: Diagnosis not present

## 2022-10-05 DIAGNOSIS — R7303 Prediabetes: Secondary | ICD-10-CM | POA: Diagnosis not present

## 2022-10-05 DIAGNOSIS — M79674 Pain in right toe(s): Secondary | ICD-10-CM

## 2022-10-05 NOTE — Progress Notes (Signed)
  Subjective:  Patient ID: Kimberly Sellers, female    DOB: Sep 26, 1945,   MRN: [id number]  Chief Complaint  Patient presents with   Nail Problem    Routine foot care     77 y.o. female presents for concern of thickened elongated and painful nails that are difficult to trim. Requesting to have them trimmed today. Relates burning and tingling in their feet. Patient is diabetic and last A1c was  Lab Results  Component Value Date   HGBA1C 5.2 05/31/2022   .   PCP:  Hillery Aldo, NP    . Denies any other pedal complaints. Denies n/v/f/c.   Past Medical History:  Diagnosis Date   Anxiety    Arthritis    Diabetes mellitus without complication (HCC)    Dyspnea    with exertion   GERD (gastroesophageal reflux disease)    Hypertension    Nausea & vomiting 06/01/2012    Objective:  Physical Exam: Vascular: DP/PT pulses 2/4 bilateral. CFT <3 seconds. Absent hair growth on digits. Edema noted to bilateral lower extremities. Xerosis noted bilaterally.  Skin. No lacerations or abrasions bilateral feet. Nails 1-5 bilateral  are thickened discolored and elongated with subungual debris.  Musculoskeletal: MMT 5/5 bilateral lower extremities in DF, PF, Inversion and Eversion. Deceased ROM in DF of ankle joint.  Neurological: Sensation intact to light touch. Protective sensation diminished bilateral.    Assessment:   1. Pain due to onychomycosis of toenails of both feet   2. Prediabetes   3. Callus      Plan:  Patient was evaluated and treated and all questions answered. -Discussed and educated patient on diabetic foot care, especially with  regards to the vascular, neurological and musculoskeletal systems.  -Stressed the importance of good glycemic control and the detriment of not  controlling glucose levels in relation to the foot. -Discussed supportive shoes at all times and checking feet regularly.  -Mechanically debrided all nails 1-5 bilateral using sterile nail nipper and filed  with dremel without incident  -Answered all patient questions -Patient to return  in 3 months for at risk foot care -Patient advised to call the office if any problems or questions arise in the meantime.   Louann Sjogren, DPM

## 2022-11-14 ENCOUNTER — Other Ambulatory Visit: Payer: Self-pay | Admitting: Student

## 2022-11-14 DIAGNOSIS — Z1231 Encounter for screening mammogram for malignant neoplasm of breast: Secondary | ICD-10-CM

## 2022-11-15 ENCOUNTER — Ambulatory Visit
Admission: RE | Admit: 2022-11-15 | Discharge: 2022-11-15 | Disposition: A | Discharge status: Home / Self Care | Payer: 59 | Source: Ambulatory Visit | Attending: Student | Admitting: Student

## 2022-11-15 DIAGNOSIS — Z1231 Encounter for screening mammogram for malignant neoplasm of breast: Secondary | ICD-10-CM

## 2022-11-22 ENCOUNTER — Telehealth: Payer: Self-pay | Admitting: Cardiology

## 2022-11-22 NOTE — Telephone Encounter (Signed)
I spoke with patient and she will be here for appointment tomorrow.

## 2022-11-22 NOTE — Telephone Encounter (Signed)
Called by PCP. ECG today showed new LBBB. Unable to view in the system. Recent CV work-up with normal myoview in 12/2021. TTE 05/2022 with normal EF, G1DD, trivial MR and AR. Currently she is having weakness but no exertional chest pain or SOB. Will follow-up with Dr. Eldridge Dace or APP for further evaluation.   Laurance Flatten, MD

## 2022-11-22 NOTE — Telephone Encounter (Signed)
Tried again to reach patient.  Left message to call office 

## 2022-11-22 NOTE — Telephone Encounter (Signed)
Patient has been scheduled to see Dr Anne Fu (office DOD) tomorrow.  I placed call to patient to make her aware of appointment.  Left message to call office. EKG and office note from today requested from [facility]

## 2022-11-23 ENCOUNTER — Encounter: Payer: Self-pay | Admitting: Cardiology

## 2022-11-23 ENCOUNTER — Ambulatory Visit: Payer: 59 | Attending: Interventional Cardiology | Admitting: Cardiology

## 2022-11-23 VITALS — BP 136/78 | HR 78 | Ht 67.0 in | Wt 193.0 lb

## 2022-11-23 DIAGNOSIS — I447 Left bundle-branch block, unspecified: Secondary | ICD-10-CM

## 2022-11-23 DIAGNOSIS — I10 Essential (primary) hypertension: Secondary | ICD-10-CM | POA: Diagnosis not present

## 2022-11-23 DIAGNOSIS — I700 Atherosclerosis of aorta: Secondary | ICD-10-CM

## 2022-11-23 NOTE — Patient Instructions (Signed)
Medication Instructions:  The current medical regimen is effective;  continue present plan and medications.  *If you need a refill on your cardiac medications before your next appointment, please call your pharmacy*  Follow-Up: At [facility], you and your health needs are our priority.  As part of our continuing mission to provide you with exceptional heart care, we have created designated Provider Care Teams.  These Care Teams include your primary Cardiologist (physician) and Advanced Practice Providers (APPs -  Physician Assistants and Nurse Practitioners) who all work together to provide you with the care you need, when you need it.  We recommend signing up for the patient portal called "MyChart".  Sign up information is provided on this After Visit Summary.  MyChart is used to connect with patients for Virtual Visits (Telemedicine).  Patients are able to view lab/test results, encounter notes, upcoming appointments, etc.  Non-urgent messages can be sent to your provider as well.   To learn more about what you can do with MyChart, go to [email or web address].    Your next appointment:   Follow up in 1 year with Dr Anne Fu.

## 2022-11-23 NOTE — Progress Notes (Signed)
Cardiology Office Note:    Date:  11/23/2022   ID:  Kimberly Sellers, DOB 21-Apr-1946, MRN [id number]  PCP:  Hillery Aldo, NP   [facility] Providers Cardiologist:  Lance Muss, MD     Referring MD: Hillery Aldo, NP    History of Present Illness:    Kimberly Sellers is a 77 y.o. female here for the evaluation of abnormal EKG, new left bundle branch block at the request of Hillery Aldo, NP.  Previously seen by Dr. Eldridge Dace August 2023.  Reported getting tired out.  Has been on rosuvastatin 10 mg a day.  Blood pressure managed with amlodipine 10 mg and lisinopril hydrochlorothiazide 20/25.  She is a retired Engineer, civil (consulting).  Does a lot of volunteering work.  She raised her kids and several kids from her siblings who passed away.  Never smoked.  Diabetes Aortic atherosclerosis-Crestor Hypertension  Nuclear stress test 8/23 was normal. Echocardiogram 1/24 was normal EF with grade 1 diastolic dysfunction trivial MR and AR.  She has had some weakness but no exertional chest pain or shortness of breath.  Unfortunately has had increased stress in her life with her brother, her heart, recently dying as well as a cousin and an friend back to back.  Denies any syncope bleeding orthopnea PND  Past Medical History:  Diagnosis Date   Anxiety    Arthritis    Diabetes mellitus without complication (HCC)    Dyspnea    with exertion   GERD (gastroesophageal reflux disease)    Hypertension    Nausea & vomiting 06/01/2012    Past Surgical History:  Procedure Laterality Date   ABDOMINAL HYSTERECTOMY     CHOLECYSTECTOMY     KNEE ARTHROPLASTY Left 06/08/2022   Procedure: COMPUTER ASSISTED TOTAL KNEE ARTHROPLASTY;  Surgeon: Samson Frederic, MD;  Location: [facility];  Service: Orthopedics;  Laterality: Left;  160    Current Medications: Current Meds  Medication Sig   amLODipine (NORVASC) 10 MG tablet Take 10 mg by mouth daily.   Cholecalciferol (VITAMIN D3) 2000 units capsule Take  2,000 Units by mouth daily.   cycloSPORINE (RESTASIS) 0.05 % ophthalmic emulsion Place 1 drop into both eyes 2 (two) times daily as needed (dry eyes).   lisinopril-hydrochlorothiazide (PRINZIDE,ZESTORETIC) 20-25 MG tablet Take 1 tablet by mouth daily.   loratadine (CLARITIN) 10 MG tablet Take 10 mg by mouth daily as needed for allergies.   Menthol, Topical Analgesic, (ICY HOT EX) Apply 1 Application topically daily as needed (pain).   Multiple Vitamin (MULTIVITAMIN PO) Take 1 tablet by mouth daily.   pantoprazole (PROTONIX) 40 MG tablet Take 40 mg by mouth daily.   [DISCONTINUED] HYDROcodone-acetaminophen (NORCO/VICODIN) 5-325 MG tablet Take 1 tablet by mouth every 4 (four) hours as needed for moderate pain or severe pain.   [DISCONTINUED] ondansetron (ZOFRAN) 4 MG tablet Take 1 tablet (4 mg total) by mouth every 8 (eight) hours as needed for nausea or vomiting.   [DISCONTINUED] polyethylene glycol (MIRALAX) 17 g packet Take 17 g by mouth daily as needed for moderate constipation or severe constipation.     Allergies:   Patient has no known allergies.   Social History   Socioeconomic History   Marital status: Widowed    Spouse name: Not on file   Number of children: Not on file   Years of education: Not on file   Highest education level: Not on file  Occupational History   Not on file  Tobacco Use   Smoking status: Former  Types: Cigarettes   Smokeless tobacco: Never  Vaping Use   Vaping Use: Never used  Substance and Sexual Activity   Alcohol use: No   Drug use: No   Sexual activity: Never    Birth control/protection: Post-menopausal  Other Topics Concern   Not on file  Social History Narrative   Not on file   Social Determinants of Health   Financial Resource Strain: Not on file  Food Insecurity: No Food Insecurity (06/08/2022)   Hunger Vital Sign    Worried About Running Out of Food in the Last Year: Never true    Ran Out of Food in the Last Year: Never true   Transportation Needs: No Transportation Needs (06/08/2022)   PRAPARE - Administrator, Civil Service (Medical): No    Lack of Transportation (Non-Medical): No  Physical Activity: Not on file  Stress: Not on file  Social Connections: Not on file     Family History: The patient's family history includes Breast cancer in her cousin.  ROS:   Please see the history of present illness.     All other systems reviewed and are negative.  EKGs/Labs/Other Studies Reviewed:    The following studies were reviewed today: As above  EKG:  EKG Interpretation Date/Time:  Wednesday November 23 2022 10:27:23 EDT Ventricular Rate:  78 PR Interval:  182 QRS Duration:  138 QT Interval:  422 QTC Calculation: 481 R Axis:   -30  Text Interpretation: Normal sinus rhythm with sinus arrhythmia Left axis deviation Left bundle branch block When compared with ECG of 09-Jun-2022 13:52, Left bundle branch block is now Present Confirmed by Donato Schultz ([id number]) on 11/23/2022 10:34:14 AM    Recent Labs: 06/09/2022: TSH 0.362 06/15/2022: BUN 14; Creatinine, Ser 0.61; Hemoglobin 9.1; Magnesium 1.9; Platelets 387; Potassium 4.8; Sodium 138  Recent Lipid Panel    Component Value Date/Time   CHOL 157 01/31/2008 2017   TRIG 111 01/31/2008 2017   HDL 42 01/31/2008 2017   CHOLHDL 3.7 Ratio 01/31/2008 2017   VLDL 22 01/31/2008 2017   LDLCALC 93 01/31/2008 2017     Risk Assessment/Calculations:               Physical Exam:    VS:  BP 136/78   Pulse 78   Ht 5\' 7"  (1.702 m)   Wt 193 lb (87.5 kg)   SpO2 96%   BMI 30.23 kg/m     Wt Readings from Last 3 Encounters:  11/23/22 193 lb (87.5 kg)  06/08/22 180 lb (81.6 kg)  05/31/22 180 lb (81.6 kg)     GEN:  Well nourished, well developed in no acute distress HEENT: Normal NECK: No JVD; No carotid bruits LYMPHATICS: No lymphadenopathy CARDIAC: Rare ectopy RRR, no murmurs, rubs, gallops RESPIRATORY:  Clear to auscultation without rales,  wheezing or rhonchi  ABDOMEN: Soft, non-tender, non-distended MUSCULOSKELETAL:  No edema; No deformity  SKIN: Warm and dry NEUROLOGIC:  Alert and oriented x 3 PSYCHIATRIC:  Normal affect   ASSESSMENT:    1. LBBB (left bundle branch block)   2. Essential hypertension   3. Aortic atherosclerosis (HCC)   4. Primary hypertension    PLAN:    In order of problems listed above:  Left bundle branch block - New finding on EKG.  Thankfully she has just had a nuclear stress test as well as an echocardiogram both of which were normal.  She is not having any chest pain currently or shortness of breath.  Reassurance has been given.  Explained to her that this is from the electrical system of her heart.  Thankfully her right bundle is functioning well.  Obviously if more electrical deterioration were to occur, pacemaker may be warranted in the future but this is not a certainty. -Continue with good activity.  Hypertension - Medications reviewed as above.  No changes made.          Medication Adjustments/Labs and Tests Ordered: Current medicines are reviewed at length with the patient today.  Concerns regarding medicines are outlined above.  Orders Placed This Encounter  Procedures   EKG 12-Lead   No orders of the defined types were placed in this encounter.   Patient Instructions  Medication Instructions:  The current medical regimen is effective;  continue present plan and medications.  *If you need a refill on your cardiac medications before your next appointment, please call your pharmacy*  Follow-Up: At [facility], you and your health needs are our priority.  As part of our continuing mission to provide you with exceptional heart care, we have created designated Provider Care Teams.  These Care Teams include your primary Cardiologist (physician) and Advanced Practice Providers (APPs -  Physician Assistants and Nurse Practitioners) who all work together to provide you  with the care you need, when you need it.  We recommend signing up for the patient portal called "MyChart".  Sign up information is provided on this After Visit Summary.  MyChart is used to connect with patients for Virtual Visits (Telemedicine).  Patients are able to view lab/test results, encounter notes, upcoming appointments, etc.  Non-urgent messages can be sent to your provider as well.   To learn more about what you can do with MyChart, go to [email or web address].    Your next appointment:   Follow up in 1 year with Dr Anne Fu.    Signed, Donato Schultz, MD  11/23/2022 10:47 AM    [facility]

## 2022-11-28 ENCOUNTER — Ambulatory Visit: Payer: 59 | Admitting: Physician Assistant

## 2022-12-01 ENCOUNTER — Ambulatory Visit: Payer: 59 | Admitting: Interventional Cardiology

## 2023-01-09 ENCOUNTER — Encounter: Payer: Self-pay | Admitting: Podiatry

## 2023-01-09 ENCOUNTER — Ambulatory Visit: Payer: 59 | Admitting: Podiatry

## 2023-01-09 DIAGNOSIS — M79675 Pain in left toe(s): Secondary | ICD-10-CM

## 2023-01-09 DIAGNOSIS — M79674 Pain in right toe(s): Secondary | ICD-10-CM

## 2023-01-09 DIAGNOSIS — R7303 Prediabetes: Secondary | ICD-10-CM

## 2023-01-09 DIAGNOSIS — L84 Corns and callosities: Secondary | ICD-10-CM

## 2023-01-09 DIAGNOSIS — B351 Tinea unguium: Secondary | ICD-10-CM

## 2023-01-09 NOTE — Progress Notes (Signed)
  Subjective:  Patient ID: Kimberly Sellers, female    DOB: Sep 27, 1945,   MRN: [id number]  No chief complaint on file.   77 y.o. female presents for concern of thickened elongated and painful nails that are difficult to trim. Requesting to have them trimmed today. Relates burning and tingling in their feet. Patient is diabetic and last A1c was  Lab Results  Component Value Date   HGBA1C 5.2 05/31/2022   .   PCP:  Hillery Aldo, NP    . Denies any other pedal complaints. Denies n/v/f/c.   Past Medical History:  Diagnosis Date   Anxiety    Arthritis    Diabetes mellitus without complication (HCC)    Dyspnea    with exertion   GERD (gastroesophageal reflux disease)    Hypertension    Nausea & vomiting 06/01/2012    Objective:  Physical Exam: Vascular: DP/PT pulses 2/4 bilateral. CFT <3 seconds. Absent hair growth on digits. Edema noted to bilateral lower extremities. Xerosis noted bilaterally.  Skin. No lacerations or abrasions bilateral feet. Nails 1-5 bilateral  are thickened discolored and elongated with subungual debris.  Musculoskeletal: MMT 5/5 bilateral lower extremities in DF, PF, Inversion and Eversion. Deceased ROM in DF of ankle joint.  Neurological: Sensation intact to light touch. Protective sensation diminished bilateral.    Assessment:   1. Pain due to onychomycosis of toenails of both feet   2. Prediabetes   3. Callus       Plan:  Patient was evaluated and treated and all questions answered. -Discussed and educated patient on diabetic foot care, especially with  regards to the vascular, neurological and musculoskeletal systems.  -Stressed the importance of good glycemic control and the detriment of not  controlling glucose levels in relation to the foot. -Discussed supportive shoes at all times and checking feet regularly.  -Mechanically debrided all nails 1-5 bilateral using sterile nail nipper and filed with dremel without incident  -Answered all  patient questions -Patient to return  in 3 months for at risk foot care -Patient advised to call the office if any problems or questions arise in the meantime.   Louann Sjogren, DPM

## 2023-03-07 ENCOUNTER — Ambulatory Visit: Payer: 59 | Admitting: Interventional Cardiology

## 2023-04-18 ENCOUNTER — Ambulatory Visit (INDEPENDENT_AMBULATORY_CARE_PROVIDER_SITE_OTHER): Payer: 59 | Admitting: Podiatry

## 2023-04-18 ENCOUNTER — Encounter: Payer: Self-pay | Admitting: Podiatry

## 2023-04-18 ENCOUNTER — Telehealth: Payer: Self-pay | Admitting: Podiatry

## 2023-04-18 DIAGNOSIS — R7303 Prediabetes: Secondary | ICD-10-CM

## 2023-04-18 DIAGNOSIS — B351 Tinea unguium: Secondary | ICD-10-CM | POA: Diagnosis not present

## 2023-04-18 DIAGNOSIS — M79675 Pain in left toe(s): Secondary | ICD-10-CM

## 2023-04-18 DIAGNOSIS — M79674 Pain in right toe(s): Secondary | ICD-10-CM

## 2023-04-18 DIAGNOSIS — L84 Corns and callosities: Secondary | ICD-10-CM

## 2023-04-18 NOTE — Telephone Encounter (Signed)
Notified pt that Dr Eloy End apologizes but forgot to trim left heel callus. Pt said you and her got to talking and got distracted with it. She is scheduled to come back in tomorrow 12/4 at 1030

## 2023-04-18 NOTE — Progress Notes (Signed)
  Subjective:  Patient ID: Kimberly Sellers, female    DOB: 09-19-1945,  MRN: [id number]  77 y.o. female presents callus(es) both feet and painful thick toenails that are difficult to trim. Painful toenails interfere with ambulation. Aggravating factors include wearing enclosed shoe gear. Pain is relieved with periodic professional debridement. Painful calluses are aggravated when weightbearing with and without shoegear. Pain is relieved with periodic professional debridement. Patient states her left 5th toe is really sore under the toenail. Chief Complaint  Patient presents with   Diabetes    Last A1C 6.5 "not 100% sure"   Foot Pain    DFC, last seen 08/24, PCP Hillery Aldo.   New problem(s): None   PCP is Hillery Aldo, NP.  No Known Allergies  Review of Systems: Negative except as noted in the HPI.   Objective:  [name] is a pleasant 77 y.o. female thin build in NAD. AAO x 3.  Vascular Examination: Vascular status intact b/l with palpable pedal pulses. CFT immediate b/l. Pedal hair present. No edema. No pain with calf compression b/l. Skin temperature gradient WNL b/l. No varicosities noted. No cyanosis or clubbing noted.  Neurological Examination: Sensation grossly intact b/l with 10 gram monofilament. Vibratory sensation intact b/l.  Dermatological Examination: Pedal skin with normal turgor, texture and tone b/l. No open wounds nor interdigital macerations noted. Toenails 1-5 b/l thick, discolored, elongated with subungual debris and pain on dorsal palpation.   Hyperkeratotic lesion(s) left heel, left great toe, and L 5th toe.  No erythema, no edema, no drainage, no fluctuance.  Musculoskeletal Examination: Muscle strength 5/5 to b/l LE. Pes planus deformity noted bilateral LE. Adductovarus deformity b/l 5th toes. No pain, crepitus or joint limitation noted with ROM b/l LE.  Patient ambulates with cane assistance.  Radiographs: None  Last A1c:      Latest Ref Rng  & Units 05/31/2022    9:30 AM  Hemoglobin A1C  Hemoglobin-A1c 4.8 - 5.6 % 5.2      Assessment:   1. Pain due to onychomycosis of toenails of both feet   2. Corns and callosities   3. Prediabetes    Plan:  -Consent given for treatment as described below: -Examined patient. -Patient to continue soft, supportive shoe gear daily. -Toenails 1-5 b/l were debrided in length and girth with sterile nail nippers and dremel without iatrogenic bleeding.  -Callus(es) left great toe and Lister's corn left 5th toe pared utilizing sterile scalpel blade without complication or incident. Total number debrided =2. Left heel callus not debrided. Will call patient back and she may come in at her convenience to have it trimmed at no charge. -Patient/POA to call should there be question/concern in the interim.  Return in about 3 months (around 07/17/2023).  Freddie Breech, DPM       LOCATION: [address] [place] [address]                   Office [phone]   [facility] LOCATION: [address] [place], [place] [address] Office [phone]

## 2023-04-19 ENCOUNTER — Encounter: Payer: Self-pay | Admitting: Podiatry

## 2023-04-19 ENCOUNTER — Ambulatory Visit (INDEPENDENT_AMBULATORY_CARE_PROVIDER_SITE_OTHER): Payer: 59 | Admitting: Podiatry

## 2023-04-19 VITALS — Ht 67.0 in | Wt 193.0 lb

## 2023-04-19 DIAGNOSIS — L84 Corns and callosities: Secondary | ICD-10-CM

## 2023-04-23 NOTE — Progress Notes (Signed)
  Subjective:  Patient ID: Kimberly Sellers, female    DOB: 1945-10-07,  MRN: [id number]  Kimberly Sellers presents to clinic today for  follow up visit. Patient was here yesterday and left heel callus was not debrided.   Chief Complaint  Patient presents with   Nail Problem    RFC   New problem(s): None.   PCP is Hillery Aldo, NP.  No Known Allergies  Review of Systems: Negative except as noted in the HPI.  Objective: No changes noted in today's physical examination. There were no vitals filed for this visit.  Kimberly Sellers is a pleasant 77 y.o. female WD, WN in NAD. AAO x 3.  NVS unchanged b/l.  Muscle strength 5/5 to b/l LE. Pes planus deformity noted bilateral LE. Adductovarus deformity b/l 5th toes. No pain, crepitus or joint limitation noted with ROM b/l LE.  Patient ambulates with cane assistance.  Heel callus noted plantar aspect left heel.   Assessment/Plan: 1. Callus     -Consent given for treatment as described below: -Examined patient. -Callus(es) left heel pared utilizing sterile scalpel blade without complication or incident. Total number debrided =1. -Patient/POA to call should there be question/concern in the interim.   No follow-ups on file.  Freddie Breech, DPM      [facility] LOCATION: [address] [place] [address]                   Office [phone]   [facility] LOCATION: [address] [place], [place] [address] Office [phone]

## 2023-07-17 ENCOUNTER — Encounter: Payer: Self-pay | Admitting: Podiatry

## 2023-07-17 ENCOUNTER — Ambulatory Visit (INDEPENDENT_AMBULATORY_CARE_PROVIDER_SITE_OTHER): Payer: 59 | Admitting: Podiatry

## 2023-07-17 DIAGNOSIS — L84 Corns and callosities: Secondary | ICD-10-CM | POA: Diagnosis not present

## 2023-07-17 DIAGNOSIS — M79675 Pain in left toe(s): Secondary | ICD-10-CM | POA: Diagnosis not present

## 2023-07-17 DIAGNOSIS — B351 Tinea unguium: Secondary | ICD-10-CM | POA: Diagnosis not present

## 2023-07-17 DIAGNOSIS — M79674 Pain in right toe(s): Secondary | ICD-10-CM

## 2023-07-17 DIAGNOSIS — R7303 Prediabetes: Secondary | ICD-10-CM | POA: Diagnosis not present

## 2023-07-17 NOTE — Progress Notes (Signed)
  Subjective:  Patient ID: Kimberly Sellers, female    DOB: Jul 14, 1945,   MRN: [id number]  No chief complaint on file.   78 y.o. female presents for concern of thickened elongated and painful nails that are difficult to trim. Requesting to have them trimmed today. Relates burning and tingling in their feet. Patient is diabetic and last A1c was  Lab Results  Component Value Date   HGBA1C 5.2 05/31/2022   .   PCP:  Hillery Aldo, NP    . Denies any other pedal complaints. Denies n/v/f/c.   Past Medical History:  Diagnosis Date   Anxiety    Arthritis    Diabetes mellitus without complication (HCC)    Dyspnea    with exertion   GERD (gastroesophageal reflux disease)    Hypertension    Nausea & vomiting 06/01/2012    Objective:  Physical Exam: Vascular: DP/PT pulses 2/4 bilateral. CFT <3 seconds. Absent hair growth on digits. Edema noted to bilateral lower extremities. Xerosis noted bilaterally.  Skin. No lacerations or abrasions bilateral feet. Nails 1-5 bilateral  are thickened discolored and elongated with subungual debris. Hyperkeratotic lesion noted to left heel, left great to and left fifth toe.   Musculoskeletal: MMT 5/5 bilateral lower extremities in DF, PF, Inversion and Eversion. Deceased ROM in DF of ankle joint.  Neurological: Sensation intact to light touch. Protective sensation diminished bilateral.    Assessment:   1. Pain due to onychomycosis of toenails of both feet   2. Prediabetes       Plan:  Patient was evaluated and treated and all questions answered. -Discussed and educated patient on diabetic foot care, especially with  regards to the vascular, neurological and musculoskeletal systems.  -Stressed the importance of good glycemic control and the detriment of not  controlling glucose levels in relation to the foot. -Discussed supportive shoes at all times and checking feet regularly.  -Mechanically debrided all nails 1-5 bilateral using sterile nail  nipper and filed with dremel without incident  -Hyperkeratotic lesions x 3 to left heel left great toe and left fifth toe debrided with chisel without incident -Answered all patient questions -Patient to return  in 3 months for at risk foot care -Patient advised to call the office if any problems or questions arise in the meantime.   Louann Sjogren, DPM

## 2023-10-18 ENCOUNTER — Ambulatory Visit (INDEPENDENT_AMBULATORY_CARE_PROVIDER_SITE_OTHER): Admitting: Podiatry

## 2023-10-18 ENCOUNTER — Encounter: Payer: Self-pay | Admitting: Podiatry

## 2023-10-18 DIAGNOSIS — M79675 Pain in left toe(s): Secondary | ICD-10-CM

## 2023-10-18 DIAGNOSIS — L84 Corns and callosities: Secondary | ICD-10-CM

## 2023-10-18 DIAGNOSIS — B351 Tinea unguium: Secondary | ICD-10-CM

## 2023-10-18 DIAGNOSIS — E119 Type 2 diabetes mellitus without complications: Secondary | ICD-10-CM | POA: Diagnosis not present

## 2023-10-18 DIAGNOSIS — M79674 Pain in right toe(s): Secondary | ICD-10-CM

## 2023-10-18 DIAGNOSIS — R7303 Prediabetes: Secondary | ICD-10-CM

## 2023-10-22 NOTE — Progress Notes (Unsigned)
  Subjective:  Patient ID: Kimberly Sellers, female    DOB: [birth year],  MRN: [id number]  Kimberly Sellers presents to clinic today for {jgcomplaint:23593}  Chief Complaint  Patient presents with   Dfc    Rm17/ dfc/ blood sugar 98/ A1C 5.6 last pcp visit May 2025   New problem(s): None. {jgcomplaint:23593}  PCP is Maryellen Snare, NP.  No Known Allergies  Review of Systems: Negative except as noted in the HPI.  Objective: No changes noted in today's physical examination. There were no vitals filed for this visit. Kimberly Sellers is a pleasant 78 y.o. female {jgbodyhabitus:24098} AAO x 3.  Assessment/Plan: 1. Pain due to onychomycosis of toenails of both feet   2. Corns and callosities   3. Prediabetes     No orders of the defined types were placed in this encounter.   None {Jgplan:23602::"-Patient/POA to call should there be question/concern in the interim."}   Return in about 3 months (around 01/18/2024).  Kimberly Sellers, DPM      [place] LOCATION: [address] [place] [address]                   Office [phone]   [facility] LOCATION: [address] [place], [place] [address] Office [phone]

## 2023-10-23 ENCOUNTER — Encounter: Payer: Self-pay | Admitting: Podiatry

## 2023-11-09 ENCOUNTER — Other Ambulatory Visit: Payer: Self-pay | Admitting: Student

## 2023-11-09 DIAGNOSIS — Z1231 Encounter for screening mammogram for malignant neoplasm of breast: Secondary | ICD-10-CM

## 2023-12-01 ENCOUNTER — Ambulatory Visit

## 2023-12-12 ENCOUNTER — Ambulatory Visit

## 2023-12-15 ENCOUNTER — Ambulatory Visit
Admission: RE | Admit: 2023-12-15 | Discharge: 2023-12-15 | Disposition: A | Discharge status: Home / Self Care | Source: Ambulatory Visit | Attending: Student | Admitting: Student

## 2023-12-15 DIAGNOSIS — Z1231 Encounter for screening mammogram for malignant neoplasm of breast: Secondary | ICD-10-CM

## 2023-12-21 ENCOUNTER — Other Ambulatory Visit: Payer: Self-pay | Admitting: Student

## 2023-12-21 DIAGNOSIS — R928 Other abnormal and inconclusive findings on diagnostic imaging of breast: Secondary | ICD-10-CM

## 2023-12-27 ENCOUNTER — Ambulatory Visit
Admission: RE | Admit: 2023-12-27 | Discharge: 2023-12-27 | Disposition: A | Discharge status: Home / Self Care | Source: Ambulatory Visit | Attending: Student | Admitting: Student

## 2023-12-27 ENCOUNTER — Ambulatory Visit: Admission: RE | Admit: 2023-12-27 | Source: Ambulatory Visit

## 2023-12-27 DIAGNOSIS — R928 Other abnormal and inconclusive findings on diagnostic imaging of breast: Secondary | ICD-10-CM

## 2024-01-31 ENCOUNTER — Encounter: Payer: Self-pay | Admitting: Podiatry

## 2024-01-31 ENCOUNTER — Ambulatory Visit (INDEPENDENT_AMBULATORY_CARE_PROVIDER_SITE_OTHER): Admitting: Podiatry

## 2024-01-31 DIAGNOSIS — M79674 Pain in right toe(s): Secondary | ICD-10-CM

## 2024-01-31 DIAGNOSIS — L84 Corns and callosities: Secondary | ICD-10-CM | POA: Diagnosis not present

## 2024-01-31 DIAGNOSIS — E119 Type 2 diabetes mellitus without complications: Secondary | ICD-10-CM

## 2024-01-31 DIAGNOSIS — B351 Tinea unguium: Secondary | ICD-10-CM

## 2024-01-31 DIAGNOSIS — M79675 Pain in left toe(s): Secondary | ICD-10-CM | POA: Diagnosis not present

## 2024-02-04 ENCOUNTER — Encounter: Payer: Self-pay | Admitting: Podiatry

## 2024-02-04 NOTE — Progress Notes (Signed)
  Subjective:  Patient ID: Kimberly Sellers, female    DOB: 1945-10-22,  MRN: [id number]  PIERCE BIAGINI presents to clinic today for preventative diabetic foot care and callus(es) left foot and painful mycotic toenails that are difficult to trim. Painful toenails interfere with ambulation. Aggravating factors include wearing enclosed shoe gear. Pain is relieved with periodic professional debridement. Painful calluses are aggravated when weightbearing with and without shoegear. Pain is relieved with periodic professional debridement.  Chief Complaint  Patient presents with   Diabetes    [facility] Diet control diabetes. A1C 5.2. LOV with PCP 10/2023. Toenail trim.   New problem(s): None.   PCP is Campbell Reynolds, NP.  No Known Allergies  Review of Systems: Negative except as noted in the HPI.  Objective: No changes noted in today's physical examination. There were no vitals filed for this visit. Kimberly Sellers is a pleasant 78 y.o. female WD, WN in NAD. AAO x 3.  Vascular Examination: Capillary refill time immediate b/l. Vascular status intact b/l with palpable pedal pulses. Pedal hair absent b/l. No pain with calf compression b/l. Skin temperature gradient WNL b/l. No cyanosis or clubbing b/l. No ischemia or gangrene noted b/l. No edema noted b/l LE.  Neurological Examination: Sensation grossly intact b/l with 10 gram monofilament. Vibratory sensation intact b/l.   Dermatological Examination: Pedal skin with normal turgor, texture and tone b/l.  No open wounds. No interdigital macerations.   Toenails 1-5 b/l thick, discolored, elongated with subungual debris and pain on dorsal palpation.   Hyperkeratotic lesion(s) left heel and IPJ left great toe. No erythema, no edema, no drainage, no fluctuance.  Musculoskeletal Examination: Muscle strength 5/5 to all lower extremity muscle groups bilaterally. 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 Uses  walking stick for ambulation assistance.  Radiographs: None  Assessment/Plan: 1. Pain due to onychomycosis of toenails of both feet   2. Callus   3. Controlled type 2 diabetes mellitus without complication, without long-term current use of insulin  [facility])   Patient was evaluated and treated. All patient's and/or POA's questions/concerns addressed on today's visit. Toenails 1-5 debrided in length and girth without incident. Callus(es) plantar heel pad of left foot and medial IPJ of left great toe pared with sharp debridement without incident. Continue daily foot inspections and monitor blood glucose per PCP/Endocrinologist's recommendations. Continue soft, supportive shoe gear daily. Report any pedal injuries to medical professional. Call office if there are any questions/concerns.  Return in about 3 months (around 05/01/2024).  [name], DPM      [place] LOCATION: [address], [place] [address]                   Office [phone]   [facility] LOCATION: [address] [place], [place] [address] Office [phone]

## 2024-05-21 ENCOUNTER — Encounter: Payer: Self-pay | Admitting: Podiatry

## 2024-05-21 ENCOUNTER — Ambulatory Visit: Admitting: Podiatry

## 2024-05-21 DIAGNOSIS — L84 Corns and callosities: Secondary | ICD-10-CM | POA: Diagnosis not present

## 2024-05-21 DIAGNOSIS — B351 Tinea unguium: Secondary | ICD-10-CM

## 2024-05-21 DIAGNOSIS — M79675 Pain in left toe(s): Secondary | ICD-10-CM

## 2024-05-21 DIAGNOSIS — E119 Type 2 diabetes mellitus without complications: Secondary | ICD-10-CM | POA: Diagnosis not present

## 2024-05-21 DIAGNOSIS — M79674 Pain in right toe(s): Secondary | ICD-10-CM | POA: Diagnosis not present

## 2024-05-26 NOTE — Progress Notes (Signed)
"  °  Subjective:  Patient ID: Kimberly Sellers, female    DOB: 1945-06-15,  MRN: [id number]  [name] presents to clinic today for preventative diabetic foot care and callus(es) left lower extremity and painful mycotic toenails that are difficult to trim. Painful toenails interfere with ambulation. Aggravating factors include wearing enclosed shoe gear. Pain is relieved with periodic professional debridement. Painful calluses are aggravated when weightbearing with and without shoegear. Pain is relieved with periodic professional debridement.  Chief Complaint  Patient presents with   RFC    Rm16 Diabetic foot care / Dr. Damian Christians last visit August 2025/ A1c 5.2   New problem(s): None.   PCP is Christians Damian, NP.  Allergies[1]  Review of Systems: Negative except as noted in the HPI.  Objective: No changes noted in today's physical examination. There were no vitals filed for this visit. Kimberly Sellers is a pleasant 79 y.o. female in NAD. AAO x 3.  Vascular Examination: Capillary refill time immediate b/l. Vascular status intact b/l with palpable pedal pulses. Pedal hair absent b/l. No pain with calf compression b/l. Skin temperature gradient WNL b/l. No cyanosis or clubbing b/l. No ischemia or gangrene noted b/l. No edema noted b/l LE.  Neurological Examination: Sensation grossly intact b/l with 10 gram monofilament. Vibratory sensation intact b/l.   Dermatological Examination: Pedal skin with normal turgor, texture and tone b/l.  No open wounds. No interdigital macerations.   Toenails 1-5 b/l thick, discolored, elongated with subungual debris and pain on dorsal palpation.   Hyperkeratotic lesion(s) left heel and IPJ left great toe. No erythema, no edema, no drainage, no fluctuance.  Musculoskeletal Examination: Muscle strength 5/5 to all lower extremity muscle groups bilaterally. 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 Uses  walking stick for ambulation assistance.  Radiographs: None  Assessment/Plan: 1. Pain due to onychomycosis of toenails of both feet   2. Callus   3. Controlled type 2 diabetes mellitus without complication, without long-term current use of insulin  [facility])   Consent given for treatment. Patient examined. All patient's and/or POA's questions/concerns addressed on today's visit. Mycotic toenails 1-5 b/l  debrided in length and girth without incident. Callus(es) plantar heel pad of left foot and medial IPJ of left great toe pared with sharp debridement without incident. Continue foot and shoe inspections daily. Monitor blood glucose per PCP/Endocrinologist's recommendations.Continue soft, supportive shoe gear daily. Report any pedal injuries to medical professional. Call office if there are any quesitons/concerns.   Return in about 3 months (around 08/19/2024).  [name], DPM      [place] LOCATION: [address] [place] [address]                   Office [phone]   [facility] LOCATION: [address] [place], [place] [address] Office [phone]     [1] No Known Allergies  "

## 2024-08-27 ENCOUNTER — Ambulatory Visit: Admitting: Podiatry
